# Patient Record
Sex: Female | Born: 1950
Health system: Southern US, Community
[De-identification: ages and names within clinical notes are randomized; demographics above are authoritative.]

## PROBLEM LIST (undated history)

## (undated) DIAGNOSIS — E785 Hyperlipidemia, unspecified: Secondary | ICD-10-CM

## (undated) DIAGNOSIS — K8020 Calculus of gallbladder without cholecystitis without obstruction: Secondary | ICD-10-CM

## (undated) DIAGNOSIS — C801 Malignant (primary) neoplasm, unspecified: Secondary | ICD-10-CM

## (undated) DIAGNOSIS — F329 Major depressive disorder, single episode, unspecified: Secondary | ICD-10-CM

## (undated) DIAGNOSIS — E039 Hypothyroidism, unspecified: Secondary | ICD-10-CM

## (undated) DIAGNOSIS — F32A Depression, unspecified: Secondary | ICD-10-CM

## (undated) DIAGNOSIS — K589 Irritable bowel syndrome without diarrhea: Secondary | ICD-10-CM

## (undated) DIAGNOSIS — F419 Anxiety disorder, unspecified: Secondary | ICD-10-CM

## (undated) DIAGNOSIS — E079 Disorder of thyroid, unspecified: Secondary | ICD-10-CM

## (undated) DIAGNOSIS — I10 Essential (primary) hypertension: Secondary | ICD-10-CM

## (undated) DIAGNOSIS — Z87442 Personal history of urinary calculi: Secondary | ICD-10-CM

## (undated) DIAGNOSIS — K219 Gastro-esophageal reflux disease without esophagitis: Secondary | ICD-10-CM

## (undated) DIAGNOSIS — J189 Pneumonia, unspecified organism: Secondary | ICD-10-CM

## (undated) HISTORY — DX: Gastro-esophageal reflux disease without esophagitis: K21.9

## (undated) HISTORY — DX: Calculus of gallbladder without cholecystitis without obstruction: K80.20

## (undated) HISTORY — DX: Disorder of thyroid, unspecified: E07.9

## (undated) HISTORY — PX: TOE FUSION: SHX1070

## (undated) HISTORY — DX: Hyperlipidemia, unspecified: E78.5

## (undated) HISTORY — DX: Essential (primary) hypertension: I10

## (undated) HISTORY — DX: Anxiety disorder, unspecified: F41.9

## (undated) HISTORY — DX: Irritable bowel syndrome, unspecified: K58.9

---

## 1978-01-18 HISTORY — PX: TUBAL LIGATION: SHX77

## 1994-01-18 HISTORY — PX: APPENDECTOMY: SHX54

## 1995-01-19 HISTORY — PX: BREAST MASS EXCISION: SHX1267

## 1998-03-28 ENCOUNTER — Other Ambulatory Visit: Admission: RE | Admit: 1998-03-28 | Discharge: 1998-03-28 | Payer: Self-pay

## 1999-04-21 ENCOUNTER — Other Ambulatory Visit: Admission: RE | Admit: 1999-04-21 | Discharge: 1999-04-21 | Payer: Self-pay | Admitting: Family Medicine

## 2000-05-02 ENCOUNTER — Other Ambulatory Visit: Admission: RE | Admit: 2000-05-02 | Discharge: 2000-05-02 | Payer: Self-pay | Admitting: Family Medicine

## 2001-09-19 ENCOUNTER — Other Ambulatory Visit: Admission: RE | Admit: 2001-09-19 | Discharge: 2001-09-19 | Payer: Self-pay | Admitting: Family Medicine

## 2002-09-26 ENCOUNTER — Other Ambulatory Visit: Admission: RE | Admit: 2002-09-26 | Discharge: 2002-09-26 | Payer: Self-pay | Admitting: Family Medicine

## 2003-10-14 ENCOUNTER — Other Ambulatory Visit: Admission: RE | Admit: 2003-10-14 | Discharge: 2003-10-14 | Payer: Self-pay | Admitting: Family Medicine

## 2004-01-06 ENCOUNTER — Ambulatory Visit (HOSPITAL_COMMUNITY): Admission: RE | Admit: 2004-01-06 | Discharge: 2004-01-06 | Payer: Self-pay | Admitting: Gastroenterology

## 2004-10-16 ENCOUNTER — Other Ambulatory Visit: Admission: RE | Admit: 2004-10-16 | Discharge: 2004-10-16 | Payer: Self-pay | Admitting: Family Medicine

## 2005-10-29 ENCOUNTER — Other Ambulatory Visit: Admission: RE | Admit: 2005-10-29 | Discharge: 2005-10-29 | Payer: Self-pay | Admitting: Family Medicine

## 2011-06-03 ENCOUNTER — Encounter (INDEPENDENT_AMBULATORY_CARE_PROVIDER_SITE_OTHER): Payer: Self-pay

## 2011-06-03 DIAGNOSIS — K8020 Calculus of gallbladder without cholecystitis without obstruction: Secondary | ICD-10-CM | POA: Insufficient documentation

## 2011-06-10 ENCOUNTER — Encounter (INDEPENDENT_AMBULATORY_CARE_PROVIDER_SITE_OTHER): Payer: Self-pay | Admitting: Surgery

## 2011-06-10 ENCOUNTER — Ambulatory Visit (INDEPENDENT_AMBULATORY_CARE_PROVIDER_SITE_OTHER): Payer: BC Managed Care – PPO | Admitting: Surgery

## 2011-06-10 VITALS — BP 122/84 | HR 92 | Temp 97.8°F | Resp 12 | Ht 63.0 in | Wt 138.4 lb

## 2011-06-10 DIAGNOSIS — K829 Disease of gallbladder, unspecified: Secondary | ICD-10-CM | POA: Insufficient documentation

## 2011-06-10 NOTE — Progress Notes (Signed)
Re:   Jaclyn Schmidt DOB:   10/08/1950 MRN:   [id number]  ASSESSMENT AND PLAN: 1.  Gall bladder disease, gall stones.  Not totally typical symptoms.  We talked a lot about seeing Dr. Randa Evens before surgery, doing more tests, or going straight to surgery.  I discussed with the patient the indications and risks of gall bladder surgery.  The primary risks of gall bladder surgery include, but are not limited to, bleeding, infection, common bile duct injury, and open surgery.  There is also the risk that the patient may have continued symptoms after surgery.  However, the likelihood of improvement in symptoms and return to the patient's normal status is good. We discussed the typical post-operative recovery course. I tried to answer the patient's questions.  I gave the patient literature about gall bladder surgery.  She wants go with surgery first and then see how she does.  2.  Hypothyroid. 3.  Hyperlipidemia. 4.  Anxiety.  Chief Complaint  Patient presents with  . Abdominal Pain    new pt- eval GB w/ stones   REFERRING PHYSICIAN: Remus Loffler, PA, PA  HISTORY OF PRESENT ILLNESS: Jaclyn Schmidt is a 61 y.o. (DOB: 10-04-1950)  white female whose primary care physician is Remus Loffler, PA, PA and comes to me today for gall bladder disease.  I know Jaclyn Schmidt, because I took care of her husband, Jaclyn Schmidt, in the [phone] with metastatic melanoma.  Since his death, she has remarried.  She now has 3 grandchildren, twins born 01/11/2011.  The patient has had known gallstones for years. She's also had irritable bowel syndrome and has been on Levsin for at least 5 years. She at one time she had trouble with constipation, but now she has more urgency with her bowel movements.  Over the past several months, she says she keeps getting sick. She has discomfort in her epigastrium and retrosternal area. She's had a history of gastroesophageal reflux disease and has tried different medications for gastric  acid such as Prilosec and omeprazole. These have not really improved her symptoms. She had a recent ultrasound performed in April 2013 showed multiple large gallstones.  Dr. Vilinda Boehringer did a colonoscopy for screening on her just 2 weeks ago. (Note:  Dr. Tawni Levy mother's colon cancer.) Unfortunately, she did not talk to Dr. Vilinda Boehringer about her abdominal symptoms.  She has no history of liver disease.  No history of pancreas disease.  No history of colon disease.  She's had a prior laparoscopic appendectomy in the 1990s.  [place] - 05/03/2011 - several large gall stones @ [facility]    Past Medical History  Diagnosis Date  . Hyperlipidemia   . Thyroid disease   . Irritable bowel syndrome   . Vitamin d deficiency   . Cholelithiasis   . Anxiety   . GERD (gastroesophageal reflux disease)   . Hypertension       Past Surgical History  Procedure Date  . Breast mass excision 1997    left  . Appendectomy 1996  . Tubal ligation 1980      Current Outpatient Prescriptions  Medication Sig Dispense Refill  . ALPRAZolam (XANAX) 0.5 MG tablet Take 0.25 mg by mouth 3 (three) times daily.      . Cholecalciferol (VITAMIN D PO) Take 1,000 Units by mouth daily.      . hyoscyamine (LEVSIN SL) 0.125 MG SL tablet Place 0.125 mg under the tongue every 4 (four) hours as needed.      Marland Kitchen  levothyroxine (SYNTHROID) 75 MCG tablet Take 75 mcg by mouth daily. On an empty stomach in the morning      . metoprolol tartrate (LOPRESSOR) 25 MG tablet Take 25 mg by mouth daily.      . Multiple Vitamin (MULTIVITAMIN) capsule Take 1 capsule by mouth daily.      . NON FORMULARY 2XT gold- dietary medication      . omeprazole (PRILOSEC) 20 MG capsule Take 20 mg by mouth daily.      . pravastatin (PRAVACHOL) 40 MG tablet Take 40 mg by mouth daily.          Allergies  Allergen Reactions  . Penicillins Rash    REVIEW OF SYSTEMS: Skin:  No history of rash.  No history of abnormal  moles. Infection:  No history of hepatitis or HIV.  No history of MRSA. Neurologic:  No history of stroke.  No history of seizure.  No history of headaches. Cardiac:  Hypertension x 1 year.  No history of seeing a cardiologist. Pulmonary: Quit smoking 2 years ago.  Endocrine:  No diabetes. On thyroid replacement.  Just adjusted her dose up.  Hypercholesterolemia. Gastrointestinal:  See HPI. Urologic:  No history of kidney stones.  No history of bladder infections. Musculoskeletal:  No history of joint or back disease. Hematologic:  No bleeding disorder.  No history of anemia.  Not anticoagulated. Psycho-social:  The patient is oriented.   The patient has no obvious psychologic or social impairment to understanding our conversation and plan.  SOCIAL and FAMILY HISTORY: Divorced x 2 years, but she says she gets along with him better now that before. Has one son.  The son has the twin grandchildren born 12/24. Works at [employer] (for almost 30 years.)  PHYSICAL EXAM: BP 122/84  Pulse 92  Temp(Src) 97.8 F (36.6 C) (Temporal)  Resp 12  Ht 5\' 3"  (1.6 m)  Wt 138 lb 6.4 oz (62.778 kg)  BMI 24.52 kg/m2  General: WN WF who is alert and generally healthy appearing. It looks like she has been in a tanning booth. HEENT: Normal. Pupils equal. Good dentition. Neck: Supple. No mass.  No thyroid mass. Lymph Nodes:  No supraclavicular or cervical nodes. Lungs: Clear to auscultation and symmetric breath sounds. Heart:  RRR. No murmur or rub. Abdomen: Soft. No mass. No tenderness. No hernia. Normal bowel sounds.  Infraumbilical scar from lap appendectomy Rectal: Not done. Extremities:  Good strength and ROM  in upper and lower extremities. Neurologic:  Grossly intact to motor and sensory function. Psychiatric: Has normal mood and affect. Behavior is normal.   DATA REVIEWED: Notes from Prudy Feeler and [place] report.  Ovidio Kin, MD,  [facility] [address]  [address]   [place], [place]    [address] Phone:  [phone] FAX:  [phone]

## 2011-07-06 ENCOUNTER — Other Ambulatory Visit (INDEPENDENT_AMBULATORY_CARE_PROVIDER_SITE_OTHER): Payer: Self-pay | Admitting: Surgery

## 2011-07-06 NOTE — Pre-Procedure Instructions (Addendum)
Please enter pt orders for surgery, pre op is 07-07-2011 at 0830am. thanks

## 2011-07-07 ENCOUNTER — Ambulatory Visit (HOSPITAL_COMMUNITY)
Admission: RE | Admit: 2011-07-07 | Discharge: 2011-07-07 | Disposition: A | Discharge status: Home / Self Care | Payer: BC Managed Care – PPO | Source: Ambulatory Visit | Attending: Surgery | Admitting: Surgery

## 2011-07-07 ENCOUNTER — Encounter (HOSPITAL_COMMUNITY): Payer: Self-pay

## 2011-07-07 ENCOUNTER — Encounter (HOSPITAL_COMMUNITY)
Admission: RE | Admit: 2011-07-07 | Discharge: 2011-07-07 | Disposition: A | Discharge status: Home / Self Care | Payer: BC Managed Care – PPO | Source: Ambulatory Visit | Attending: Surgery | Admitting: Surgery

## 2011-07-07 DIAGNOSIS — Z87891 Personal history of nicotine dependence: Secondary | ICD-10-CM | POA: Insufficient documentation

## 2011-07-07 DIAGNOSIS — Z01812 Encounter for preprocedural laboratory examination: Secondary | ICD-10-CM | POA: Insufficient documentation

## 2011-07-07 DIAGNOSIS — K8020 Calculus of gallbladder without cholecystitis without obstruction: Secondary | ICD-10-CM | POA: Insufficient documentation

## 2011-07-07 DIAGNOSIS — Z01818 Encounter for other preprocedural examination: Secondary | ICD-10-CM | POA: Insufficient documentation

## 2011-07-07 DIAGNOSIS — I10 Essential (primary) hypertension: Secondary | ICD-10-CM | POA: Insufficient documentation

## 2011-07-07 HISTORY — DX: Hypothyroidism, unspecified: E03.9

## 2011-07-07 LAB — DIFFERENTIAL
Basophils Absolute: 0 10*3/uL (ref 0.0–0.1)
Basophils Relative: 1 % (ref 0–1)
Eosinophils Absolute: 0.1 10*3/uL (ref 0.0–0.7)
Eosinophils Relative: 3 % (ref 0–5)
Lymphocytes Relative: 55 % — ABNORMAL HIGH (ref 12–46)
Lymphs Abs: 2 10*3/uL (ref 0.7–4.0)
Monocytes Absolute: 0.4 10*3/uL (ref 0.1–1.0)
Monocytes Relative: 12 % (ref 3–12)
Neutro Abs: 1.1 10*3/uL — ABNORMAL LOW (ref 1.7–7.7)
Neutrophils Relative %: 30 % — ABNORMAL LOW (ref 43–77)

## 2011-07-07 LAB — CBC
HCT: 39.4 % (ref 36.0–46.0)
Hemoglobin: 13.4 g/dL (ref 12.0–15.0)
MCH: 29.3 pg (ref 26.0–34.0)
MCHC: 34 g/dL (ref 30.0–36.0)
MCV: 86.2 fL (ref 78.0–100.0)
Platelets: 267 10*3/uL (ref 150–400)
RBC: 4.57 MIL/uL (ref 3.87–5.11)
RDW: 12.3 % (ref 11.5–15.5)
WBC: 3.6 10*3/uL — ABNORMAL LOW (ref 4.0–10.5)

## 2011-07-07 LAB — COMPREHENSIVE METABOLIC PANEL
ALT: 15 U/L (ref 0–35)
AST: 19 U/L (ref 0–37)
Albumin: 4.1 g/dL (ref 3.5–5.2)
Alkaline Phosphatase: 86 U/L (ref 39–117)
BUN: 7 mg/dL (ref 6–23)
CO2: 28 mEq/L (ref 19–32)
Calcium: 9.8 mg/dL (ref 8.4–10.5)
Chloride: 104 mEq/L (ref 96–112)
Creatinine, Ser: 0.59 mg/dL (ref 0.50–1.10)
GFR calc Af Amer: >90 mL/min (ref >90)
GFR calc non Af Amer: >90 mL/min (ref >90)
Glucose, Bld: 94 mg/dL (ref 70–99)
Potassium: 4 mEq/L (ref 3.5–5.1)
Sodium: 140 mEq/L (ref 135–145)
Total Bilirubin: 0.3 mg/dL (ref 0.3–1.2)
Total Protein: 7.3 g/dL (ref 6.0–8.3)

## 2011-07-07 LAB — SURGICAL PCR SCREEN
MRSA, PCR: NEGATIVE
Staphylococcus aureus: NEGATIVE

## 2011-07-07 NOTE — Patient Instructions (Signed)
20 Kaavya Puskarich  07/07/2011   Your procedure is scheduled on:  07/15/11 0940am-1130am  Report to [facility] at 0715 AM.  Call this number if you have problems the morning of surgery: [phone]   Remember:   Do not eat food:After Midnight.  May have clear liquids:until Midnight . Marland Kitchen  Take these medicines the morning of surgery with A SIP OF WATER:   Do not wear jewelry, make-up or nail polish.  Do not wear lotions, powders, or perfumes.  Do not shave 48 hours prior to surgery.  Do not bring valuables to the hospital.  Contacts, dentures or bridgework may not be worn into surgery.  .   Patients discharged the day of surgery will not be allowed to drive home.  Name and phone number of your driver:   Special Instructions: CHG Shower Use Special Wash: 1/2 bottle night before surgery and 1/2 bottle morning of surgery. shower chin to toes with CHG.  Wash face and private parts with regular soap.   Please read over the following fact sheets that you were given: MRSA Information, coughing and deep breathing exercises, leg exercises

## 2011-07-14 ENCOUNTER — Encounter (INDEPENDENT_AMBULATORY_CARE_PROVIDER_SITE_OTHER): Payer: Self-pay

## 2011-07-15 ENCOUNTER — Ambulatory Visit (HOSPITAL_COMMUNITY): Payer: BC Managed Care – PPO | Admitting: Anesthesiology

## 2011-07-15 ENCOUNTER — Ambulatory Visit (HOSPITAL_COMMUNITY)
Admission: RE | Admit: 2011-07-15 | Discharge: 2011-07-15 | Disposition: A | Discharge status: Home / Self Care | Payer: BC Managed Care – PPO | Source: Ambulatory Visit | Attending: Surgery | Admitting: Surgery

## 2011-07-15 ENCOUNTER — Encounter (HOSPITAL_COMMUNITY)
Admission: RE | Disposition: A | Discharge status: Home / Self Care | Payer: Self-pay | Source: Ambulatory Visit | Attending: Surgery

## 2011-07-15 ENCOUNTER — Encounter (HOSPITAL_COMMUNITY): Payer: Self-pay | Admitting: *Deleted

## 2011-07-15 ENCOUNTER — Ambulatory Visit (HOSPITAL_COMMUNITY): Payer: BC Managed Care – PPO

## 2011-07-15 ENCOUNTER — Encounter (HOSPITAL_COMMUNITY): Payer: Self-pay | Admitting: Anesthesiology

## 2011-07-15 DIAGNOSIS — Z79899 Other long term (current) drug therapy: Secondary | ICD-10-CM | POA: Insufficient documentation

## 2011-07-15 DIAGNOSIS — E039 Hypothyroidism, unspecified: Secondary | ICD-10-CM | POA: Insufficient documentation

## 2011-07-15 DIAGNOSIS — K829 Disease of gallbladder, unspecified: Secondary | ICD-10-CM

## 2011-07-15 DIAGNOSIS — K8010 Calculus of gallbladder with chronic cholecystitis without obstruction: Secondary | ICD-10-CM | POA: Insufficient documentation

## 2011-07-15 DIAGNOSIS — E785 Hyperlipidemia, unspecified: Secondary | ICD-10-CM | POA: Insufficient documentation

## 2011-07-15 DIAGNOSIS — I10 Essential (primary) hypertension: Secondary | ICD-10-CM | POA: Insufficient documentation

## 2011-07-15 DIAGNOSIS — K219 Gastro-esophageal reflux disease without esophagitis: Secondary | ICD-10-CM | POA: Insufficient documentation

## 2011-07-15 HISTORY — PX: CHOLECYSTECTOMY: SHX55

## 2011-07-15 SURGERY — LAPAROSCOPIC CHOLECYSTECTOMY WITH INTRAOPERATIVE CHOLANGIOGRAM
Anesthesia: General | Site: Abdomen | Wound class: Clean Contaminated

## 2011-07-15 MED ORDER — CHLORHEXIDINE GLUCONATE 4 % EX LIQD
1.0000 "application " | Freq: Once | CUTANEOUS | Status: DC
  Filled 2011-07-15: qty 15

## 2011-07-15 MED ORDER — BUPIVACAINE HCL (PF) 0.25 % IJ SOLN
INTRAMUSCULAR | Status: DC | PRN
  Administered 2011-07-15: 20 mL

## 2011-07-15 MED ORDER — IOHEXOL 300 MG/ML  SOLN
INTRAMUSCULAR | Status: DC | PRN
  Administered 2011-07-15: 10 mL via INTRAVENOUS

## 2011-07-15 MED ORDER — CEFAZOLIN SODIUM 1-5 GM-% IV SOLN
1.0000 g | INTRAVENOUS | Status: AC
  Administered 2011-07-15: 1 g via INTRAVENOUS

## 2011-07-15 MED ORDER — LACTATED RINGERS IV SOLN: INTRAVENOUS | Status: DC

## 2011-07-15 MED ORDER — LIDOCAINE HCL (CARDIAC) 20 MG/ML IV SOLN
INTRAVENOUS | Status: DC | PRN
  Administered 2011-07-15: 60 mg via INTRAVENOUS

## 2011-07-15 MED ORDER — SUFENTANIL CITRATE 50 MCG/ML IV SOLN
INTRAVENOUS | Status: DC | PRN
  Administered 2011-07-15 (×4): 10 ug via INTRAVENOUS

## 2011-07-15 MED ORDER — PROPOFOL 10 MG/ML IV EMUL: INTRAVENOUS | Status: DC | PRN

## 2011-07-15 MED ORDER — PROPOFOL 10 MG/ML IV EMUL
INTRAVENOUS | Status: DC | PRN
  Administered 2011-07-15: 180 mg via INTRAVENOUS

## 2011-07-15 MED ORDER — ACETAMINOPHEN 10 MG/ML IV SOLN
INTRAVENOUS | Status: DC | PRN
  Administered 2011-07-15: 1000 mg via INTRAVENOUS

## 2011-07-15 MED ORDER — LIDOCAINE HCL 4 % MT SOLN
OROMUCOSAL | Status: DC | PRN
  Administered 2011-07-15: 4 mL via TOPICAL

## 2011-07-15 MED ORDER — ONDANSETRON HCL 4 MG/2ML IJ SOLN
INTRAMUSCULAR | Status: DC | PRN
  Administered 2011-07-15: 4 mg via INTRAVENOUS

## 2011-07-15 MED ORDER — DEXAMETHASONE SODIUM PHOSPHATE 10 MG/ML IJ SOLN
INTRAMUSCULAR | Status: DC | PRN
  Administered 2011-07-15: 10 mg via INTRAVENOUS

## 2011-07-15 MED ORDER — HYDROCODONE-ACETAMINOPHEN 5-325 MG PO TABS: 1.0000 | ORAL_TABLET | Freq: Four times a day (QID) | ORAL | Status: AC | PRN

## 2011-07-15 MED ORDER — IOHEXOL 300 MG/ML  SOLN
INTRAMUSCULAR | Status: AC
  Filled 2011-07-15: qty 1

## 2011-07-15 MED ORDER — CEFAZOLIN SODIUM 1-5 GM-% IV SOLN
INTRAVENOUS | Status: AC
Start: 2011-07-15 — End: 2011-07-15
  Filled 2011-07-15: qty 50

## 2011-07-15 MED ORDER — ACETAMINOPHEN 10 MG/ML IV SOLN
INTRAVENOUS | Status: AC
  Filled 2011-07-15: qty 100

## 2011-07-15 MED ORDER — GLYCOPYRROLATE 0.2 MG/ML IJ SOLN
INTRAMUSCULAR | Status: DC | PRN
  Administered 2011-07-15: 0.4 mg via INTRAVENOUS

## 2011-07-15 MED ORDER — NEOSTIGMINE METHYLSULFATE 1 MG/ML IJ SOLN
INTRAMUSCULAR | Status: DC | PRN
  Administered 2011-07-15: 3 mg via INTRAVENOUS

## 2011-07-15 MED ORDER — LACTATED RINGERS IR SOLN
Status: DC | PRN
  Administered 2011-07-15: 1000 mL

## 2011-07-15 MED ORDER — ROCURONIUM BROMIDE 100 MG/10ML IV SOLN
INTRAVENOUS | Status: DC | PRN
  Administered 2011-07-15: 40 mg via INTRAVENOUS

## 2011-07-15 MED ORDER — HYDROMORPHONE HCL PF 1 MG/ML IJ SOLN: 0.2500 mg | INTRAMUSCULAR | Status: DC | PRN

## 2011-07-15 MED ORDER — KETOROLAC TROMETHAMINE 30 MG/ML IJ SOLN
INTRAMUSCULAR | Status: DC | PRN
  Administered 2011-07-15: 30 mg via INTRAVENOUS

## 2011-07-15 MED ORDER — BUPIVACAINE HCL (PF) 0.25 % IJ SOLN
INTRAMUSCULAR | Status: AC
Start: 2011-07-15 — End: 2011-07-15
  Filled 2011-07-15: qty 30

## 2011-07-15 MED ORDER — MIDAZOLAM HCL 5 MG/5ML IJ SOLN
INTRAMUSCULAR | Status: DC | PRN
  Administered 2011-07-15: 1 mg via INTRAVENOUS

## 2011-07-15 MED ORDER — LACTATED RINGERS IV SOLN
INTRAVENOUS | Status: DC
  Administered 2011-07-15: 1000 mL via INTRAVENOUS
  Administered 2011-07-15 (×2): via INTRAVENOUS

## 2011-07-15 MED ORDER — PROMETHAZINE HCL 25 MG/ML IJ SOLN: 6.2500 mg | INTRAMUSCULAR | Status: DC | PRN

## 2011-07-15 SURGICAL SUPPLY — 44 items
ADH SKN CLS APL DERMABOND .7 (GAUZE/BANDAGES/DRESSINGS) ×2
APL SKNCLS STERI-STRIP NONHPOA (GAUZE/BANDAGES/DRESSINGS) ×1
APPLIER CLIP ROT 10 11.4 M/L (STAPLE) ×2
APR CLP MED LRG 11.4X10 (STAPLE) ×1
BAG SPEC RTRVL LRG 6X4 10 (ENDOMECHANICALS) ×1
BENZOIN TINCTURE PRP APPL 2/3 (GAUZE/BANDAGES/DRESSINGS) ×2 IMPLANT
CANISTER SUCTION 2500CC (MISCELLANEOUS) ×2 IMPLANT
CHLORAPREP W/TINT 26ML (MISCELLANEOUS) ×2 IMPLANT
CHOLANGIOGRAM CATH TAUT (CATHETERS) ×2 IMPLANT
CLIP APPLIE ROT 10 11.4 M/L (STAPLE) ×1 IMPLANT
CLOTH BEACON ORANGE TIMEOUT ST (SAFETY) ×2 IMPLANT
COVER MAYO STAND STRL (DRAPES) ×1 IMPLANT
DECANTER SPIKE VIAL GLASS SM (MISCELLANEOUS) ×1 IMPLANT
DERMABOND ADVANCED (GAUZE/BANDAGES/DRESSINGS) ×2
DERMABOND ADVANCED .7 DNX12 (GAUZE/BANDAGES/DRESSINGS) IMPLANT
DRAPE C-ARM 42X72 X-RAY (DRAPES) ×1 IMPLANT
DRAPE LAPAROSCOPIC ABDOMINAL (DRAPES) ×2 IMPLANT
ELECT REM PT RETURN 9FT ADLT (ELECTROSURGICAL) ×2
ELECTRODE REM PT RTRN 9FT ADLT (ELECTROSURGICAL) ×1 IMPLANT
GLOVE BIOGEL PI IND STRL 7.0 (GLOVE) ×1 IMPLANT
GLOVE BIOGEL PI INDICATOR 7.0 (GLOVE) ×1
GLOVE SURG SIGNA 7.5 PF LTX (GLOVE) ×2 IMPLANT
GOWN STRL NON-REIN LRG LVL3 (GOWN DISPOSABLE) ×2 IMPLANT
GOWN STRL REIN XL XLG (GOWN DISPOSABLE) ×4 IMPLANT
HEMOSTAT SURGICEL 4X8 (HEMOSTASIS) IMPLANT
IV CATH 14GX2 1/4 (CATHETERS) ×2 IMPLANT
IV SET EXT 30 76VOL 4 MALE LL (IV SETS) ×2 IMPLANT
KIT BASIN OR (CUSTOM PROCEDURE TRAY) ×2 IMPLANT
NS IRRIG 1000ML POUR BTL (IV SOLUTION) ×1 IMPLANT
POUCH SPECIMEN RETRIEVAL 10MM (ENDOMECHANICALS) ×1 IMPLANT
SET IRRIG TUBING LAPAROSCOPIC (IRRIGATION / IRRIGATOR) ×2 IMPLANT
SLEEVE Z-THREAD 5X100MM (TROCAR) IMPLANT
SOLUTION ANTI FOG 6CC (MISCELLANEOUS) ×2 IMPLANT
STOPCOCK K 69 2C6206 (IV SETS) ×2 IMPLANT
STRIP CLOSURE SKIN 1/4X4 (GAUZE/BANDAGES/DRESSINGS) ×1 IMPLANT
SUT VIC AB 5-0 PS2 18 (SUTURE) ×2 IMPLANT
TOWEL OR 17X26 10 PK STRL BLUE (TOWEL DISPOSABLE) ×5 IMPLANT
TRAY LAP CHOLE (CUSTOM PROCEDURE TRAY) ×2 IMPLANT
TROCAR XCEL BLUNT TIP 100MML (ENDOMECHANICALS) ×2 IMPLANT
TROCAR Z-THREAD FIOS 11X100 BL (TROCAR) ×2 IMPLANT
TROCAR Z-THREAD FIOS 5X100MM (TROCAR) ×6 IMPLANT
TROCAR Z-THREAD SLEEVE 11X100 (TROCAR) ×1 IMPLANT
TUBING INSUFFLATION 10FT LAP (TUBING) ×2 IMPLANT
WATER STERILE IRR 1500ML POUR (IV SOLUTION) ×2 IMPLANT

## 2011-07-15 NOTE — Transfer of Care (Signed)
Immediate Anesthesia Transfer of Care Note  Patient: Jaclyn Schmidt  Procedure(s) Performed: Procedure(s) (LRB): LAPAROSCOPIC CHOLECYSTECTOMY WITH INTRAOPERATIVE CHOLANGIOGRAM (N/A)  Patient Location: PACU  Anesthesia Type: General  Level of Consciousness: awake and patient cooperative  Airway & Oxygen Therapy: Patient Spontanous Breathing and Patient connected to face mask oxygen  Post-op Assessment: Report given to PACU RN and Post -op Vital signs reviewed and stable  Post vital signs: stable  Complications: No apparent anesthesia complications

## 2011-07-15 NOTE — Anesthesia Postprocedure Evaluation (Signed)
Anesthesia Post Note  Patient: Jaclyn Schmidt  Procedure(s) Performed: Procedure(s) (LRB): LAPAROSCOPIC CHOLECYSTECTOMY WITH INTRAOPERATIVE CHOLANGIOGRAM (N/A)  Anesthesia type: General  Patient location: PACU  Post pain: Pain level controlled  Post assessment: Post-op Vital signs reviewed  Last Vitals:  Filed Vitals:   07/15/11 1230  BP: 123/69  Pulse: 76  Temp:   Resp: 17    Post vital signs: Reviewed  Level of consciousness: sedated  Complications: No apparent anesthesia complications

## 2011-07-15 NOTE — Discharge Instructions (Signed)
[facility] - DISCHARGE INSTRUCTIONS TO PATIENT  Return to work on:  Next week  Activity:  Driving - May drive in 2 to 3 days, doing well   Lifting - no lifting >15 pounds for 1 week  Wound Care:   May shower starting Saturday, 6/29.  Diet:  As tolerated  Follow up appointment:  Call Dr. Allene Pyo office [facility]) at [phone] for an appointment in 2 to 3 weeks.  Medications and dosages:  Resume your home medications.  You have a prescription for:  Vicodin  Call Dr. Ezzard Standing or his office  [phone]) if you have:  Temperature greater than 100.4,  Persistent nausea and vomiting,  Severe uncontrolled pain,  Redness, tenderness, or signs of infection (pain, swelling, redness, odor or green/yellow discharge around the site),  Difficulty breathing, headache or visual disturbances,  Any other questions or concerns you may have after discharge.  In an emergency, call 911 or go to an Emergency Department at a nearby hospital.

## 2011-07-15 NOTE — Anesthesia Preprocedure Evaluation (Signed)
Anesthesia Evaluation  Patient identified by MRN, date of birth, ID band Patient awake    Reviewed: Allergy & Precautions, H&P , NPO status , Patient's Chart, lab work & pertinent test results  Airway Mallampati: II TM Distance: >3 FB Neck ROM: Full    Dental  (+) Teeth Intact, Caps and Dental Advisory Given,    Pulmonary neg pulmonary ROS,  breath sounds clear to auscultation  Pulmonary exam normal       Cardiovascular Exercise Tolerance: Good hypertension, Pt. on medications and Pt. on home beta blockers Rhythm:Regular Rate:Normal     Neuro/Psych negative neurological ROS  negative psych ROS   GI/Hepatic Neg liver ROS, GERD-  Medicated,  Endo/Other  Hypothyroidism   Renal/GU negative Renal ROS  negative genitourinary   Musculoskeletal negative musculoskeletal ROS (+)   Abdominal   Peds  Hematology negative hematology ROS (+)   Anesthesia Other Findings   Reproductive/Obstetrics negative OB ROS                           Anesthesia Physical Anesthesia Plan  ASA: II  Anesthesia Plan: General   Post-op Pain Management:    Induction: Intravenous  Airway Management Planned: Oral ETT  Additional Equipment:   Intra-op Plan:   Post-operative Plan: Extubation in OR  Informed Consent: I have reviewed the patients History and Physical, chart, labs and discussed the procedure including the risks, benefits and alternatives for the proposed anesthesia with the patient or authorized representative who has indicated his/her understanding and acceptance.   Dental advisory given  Plan Discussed with: CRNA  Anesthesia Plan Comments:         Anesthesia Quick Evaluation

## 2011-07-15 NOTE — Brief Op Note (Signed)
07/15/2011  11:30 AM  PATIENT:  Jaclyn Schmidt, 61 y.o., female, MRN: [id number]  PREOP DIAGNOSIS:  cholelithiasis  POSTOP DIAGNOSIS:   Chronic cholecystitis, cholelithiasis  PROCEDURE:   Procedure(s): LAPAROSCOPIC CHOLECYSTECTOMY WITH INTRAOPERATIVE CHOLANGIOGRAM  SURGEON:   Ovidio Kin, M.D.  Threasa HeadsFredonia Highland, M.D.  ANESTHESIA:   general  Illene Silver, CRNA - CRNA Einar Pheasant, MD - Anesthesiologist Peggy Williford - CRNA  General  EBL:  Minimal  ml  BLOOD ADMINISTERED: none  DRAINS: none   LOCAL MEDICATIONS USED:   30 cc 1/4% marcaine  SPECIMEN:   Gall bladder  COUNTS CORRECT:  YES  INDICATIONS FOR PROCEDURE:  [name] is a 61 y.o. (DOB: 1950/11/17) white female whose primary care physician is Remus Loffler, PA and comes for lap chole.   The indications and risks of the surgery were explained to the patient.  The risks include, but are not limited to, infection, bleeding, and nerve injury.  Note dictated to:   #[id number]

## 2011-07-15 NOTE — H&P (Signed)
Re: Jaclyn Schmidt  DOB: 01-Apr-1950  MRN: [id number]  ASSESSMENT AND PLAN:  1. Gall bladder disease, gall stones.   Not totally typical symptoms. We talked a lot about seeing Dr. Randa Evens before surgery, doing more tests, or going straight to surgery.  I discussed with the patient the indications and risks of gall bladder surgery. The primary risks of gall bladder surgery include, but are not limited to, bleeding, infection, common bile duct injury, and open surgery. There is also the risk that the patient may have continued symptoms after surgery. However, the likelihood of improvement in symptoms and return to the patient's normal status is good. We discussed the typical post-operative recovery course. I tried to answer the patient's questions.   I gave the patient literature about gall bladder surgery.   She wants go with surgery first and then see how she does.   Patient wants to try to go home today.  2. Hypothyroid.  3. Hyperlipidemia.  4. Anxiety.   Chief Complaint   Patient presents with   .  Abdominal Pain     new pt- eval GB w/ stones    REFERRING PHYSICIAN: Remus Loffler, PA, PA   HISTORY OF PRESENT ILLNESS:  Jaclyn Schmidt is a 61 y.o. (DOB: [birth year]) white female whose primary care physician is Remus Loffler, PA, PA and comes to me today for gall bladder disease.   I know Jaclyn Schmidt, because I took care of her husband, Mudlogger, in the [phone] with metastatic melanoma. Since his death, she has remarried. She now has 3 grandchildren, twins born 01/11/2011.   The patient has had known gallstones for years. She's also had irritable bowel syndrome and has been on Levsin for at least 5 years. She at one time she had trouble with constipation, but now she has more urgency with her bowel movements.  Over the past several months, she says she keeps getting sick. She has discomfort in her epigastrium and retrosternal area. She's had a history of gastroesophageal reflux disease and has tried  different medications for gastric acid such as Prilosec and omeprazole.   These have not really improved her symptoms. She had a recent ultrasound performed in April 2013 showed multiple large gallstones.  Dr. Vilinda Boehringer did a colonoscopy for screening on her just 2 weeks ago. (Note: Dr. Tawni Levy mother's colon cancer.) Unfortunately, she did not talk to Dr. Vilinda Boehringer about her abdominal symptoms. She has no history of liver disease. No history of pancreas disease. No history of colon disease.   She's had a prior laparoscopic appendectomy in the 1990s.   [place] - 05/03/2011 - several large gall stones @ [facility]   Past Medical History   Diagnosis  Date   .  Hyperlipidemia    .  Thyroid disease    .  Irritable bowel syndrome    .  Vitamin d deficiency    .  Cholelithiasis    .  Anxiety    .  GERD (gastroesophageal reflux disease)    .  Hypertension     Past Surgical History   Procedure  Date   .  Breast mass excision  1997     left   .  Appendectomy  1996   .  Tubal ligation  1980    Current Outpatient Prescriptions   Medication  Sig  Dispense  Refill   .  ALPRAZolam (XANAX) 0.5 MG tablet  Take 0.25 mg by mouth 3 (three) times daily.     Marland Kitchen  Cholecalciferol (VITAMIN D PO)  Take 1,000 Units by mouth daily.     .  hyoscyamine (LEVSIN SL) 0.125 MG SL tablet  Place 0.125 mg under the tongue every 4 (four) hours as needed.     Marland Kitchen  levothyroxine (SYNTHROID) 75 MCG tablet  Take 75 mcg by mouth daily. On an empty stomach in the morning     .  metoprolol tartrate (LOPRESSOR) 25 MG tablet  Take 25 mg by mouth daily.     .  Multiple Vitamin (MULTIVITAMIN) capsule  Take 1 capsule by mouth daily.     .  NON FORMULARY  2XT gold- dietary medication     .  omeprazole (PRILOSEC) 20 MG capsule  Take 20 mg by mouth daily.     .  pravastatin (PRAVACHOL) 40 MG tablet  Take 40 mg by mouth daily.      Allergies   Allergen  Reactions   .  Penicillins  Rash    REVIEW OF SYSTEMS:    Skin: No history of rash. No history of abnormal moles.  Infection: No history of hepatitis or HIV. No history of MRSA.  Neurologic: No history of stroke. No history of seizure. No history of headaches.  Cardiac: Hypertension x 1 year. No history of seeing a cardiologist.  Pulmonary: Quit smoking 2 years ago.  Endocrine: No diabetes. On thyroid replacement. Just adjusted her dose up. Hypercholesterolemia.  Gastrointestinal: See HPI.  Urologic: No history of kidney stones. No history of bladder infections.  Musculoskeletal: No history of joint or back disease.  Hematologic: No bleeding disorder. No history of anemia. Not anticoagulated.  Psycho-social: The patient is oriented. The patient has no obvious psychologic or social impairment to understanding our conversation and plan.   SOCIAL and FAMILY HISTORY:  Divorced x 2 years, but she says she gets along with him better now that before.  Has one son. The son has the twin grandchildren born 12/24.  Works at [employer] (for almost 30 years.)   PHYSICAL EXAM:  BP 122/73  Pulse 72  Temp 97.4 F (36.3 C) (Oral)  Resp 18  SpO2 100%    Ht 5\' 3"  (1.6 m)  Wt 138 lb 6.4 oz (62.778 kg)  BMI 24.52 kg/m2  General: WN WF who is alert and generally healthy appearing. It looks like she has been in a tanning booth.  HEENT: Normal. Pupils equal. Good dentition.  Neck: Supple. No mass. No thyroid mass.  Lymph Nodes: No supraclavicular or cervical nodes.  Lungs: Clear to auscultation and symmetric breath sounds.  Heart: RRR. No murmur or rub.  Abdomen: Soft. No mass. No tenderness. No hernia. Normal bowel sounds. Infraumbilical scar from lap appendectomy  Rectal: Not done.  Extremities: Good strength and ROM in upper and lower extremities.  Neurologic: Grossly intact to motor and sensory function.  Psychiatric: Has normal mood and affect. Behavior is normal.   DATA REVIEWED:  Notes from Prudy Feeler and [place] report.   Ovidio Kin, MD, [facility]  [address] [place]  [place], [place] [address]  Phone: [phone] FAX: [phone]

## 2011-07-16 ENCOUNTER — Telehealth (INDEPENDENT_AMBULATORY_CARE_PROVIDER_SITE_OTHER): Payer: Self-pay

## 2011-07-16 ENCOUNTER — Encounter (HOSPITAL_COMMUNITY): Payer: Self-pay | Admitting: Surgery

## 2011-07-16 NOTE — Telephone Encounter (Signed)
Message copied by Ivory Broad on Fri Jul 16, 2011 10:29 AM ------      Message from: Cathi Roan      Created: Fri Jul 16, 2011 10:13 AM      Regarding: 1st post op       [phone] Patient needs post op apt 2 to 3 weeks already has 1 for 08/19/11 but she said it was to far out.

## 2011-07-16 NOTE — Op Note (Signed)
NAMECARNESHA, MARAVILLA                ACCOUNT NO.:  [id number]  MEDICAL RECORD NO.:  [id number]  LOCATION:  [facility]                         FACILITY:  [facility]  PHYSICIAN:  Sandria Bales. Ezzard Standing, M.D.  DATE OF BIRTH:  Jun 11, 1950  DATE OF PROCEDURE:  07/15/2011                              OPERATIVE REPORT  PREOPERATIVE DIAGNOSIS:  Symptomatic cholelithiasis.  POSTOPERATIVE DIAGNOSIS:  Chronic cholecystitis, cholelithiasis.  PROCEDURES:  Laparoscopic cholecystectomy with intraoperative cholangiogram.  SURGEON:  Sandria Bales. Ezzard Standing, MD  FIRST ASSISTANT:  Mary Sella. Andrey Campanile, MD, FACS  ANESTHESIA:  General endotracheal.  ESTIMATED BLOOD LOSS:  Minimal.  Local anesthesia was 30 mL of 0.25% Marcaine.  INDICATION FOR PROCEDURE:  Ms. Storti is a 61 year old white female who sees Prudy Feeler, [place] as her primary care doctor.  I saw her for recurrent epigastric and retrosternal pain.  She has tried some anti- reflux medications, which have not helped her symptoms.  An ultrasound in April of 2013 showed multiple large gallstones.  She had a recent colonoscopy by Dr. Carman Ching, which was negative.  I discussed with her about proceeding with cholecystectomy.  I discussed the indications and potential complications.  The potential complications of laparoscopic cholecystectomy include, but are not limited to, bleeding, infection, common bile duct injury, and open surgery.  OPERATIVE NOTE:  The patient was taken to room #[facility] at [facility], where she underwent a general endotracheal anesthesia supervised by Dr. Lucille Passy.  A time-out was held and the surgical checklist run.  Her abdomen was prepped with ChloraPrep and sterilely draped.  She was given 1 g of Ancef at the initiation of procedure.  An infraumbilical incision was made with sharp dissection carried down to the abdominal cavity.  A 0-degree 10-mm laparoscope was inserted through the Hasson trocar, and laparoscopic examination  carried out. The right and left lobes of liver were unremarkable.  Stomach was unremarkable.  The bowel that I could see was unremarkable.  She had no tumor mass or nodules within the peritoneal cavity.  I then placed three additional 5-mm trocars.  I placed a 5-mm trocar in the subxiphoid location, a 5-mm in the right mid subcostal, and 5-mm trocar in the lateral subcostal location.  The gallbladder had adhesions of the duodenum and some omentum, which I took down bluntly and sharply and dissected down to the cystic duct- gallbladder junction.  I did shoot an intraoperative cholangiogram.  The intraoperative cholangiogram was shot using a cutoff Taut catheter, inserted through a 14-gauge Jelco.  The Taut catheter was inserted in the side of the cut cystic duct and secured with an Endoclip.  I used about 10 mL of half-strength Hypaque solution.  Then, I injected under fluoroscopy showing free flow of contrast down the cystic duct and common bile duct up the hepatic radicals and into the duodenum.  This was felt to be a normal intraoperative cholangiogram.  The Taut catheter was removed.  The cystic duct was triply endoclipped and divided.  She had a branch of cystic artery went through the triangle of Calot, this was doubly endoclipped and divided.  I then sharply and bluntly dissected the gallbladder from the gallbladder bed.  She had at least one more branch may be a posterior cystic artery going to the gallbladder that I clipped.  After the gallbladder was completely removed, it was placed into an Endocatch bag and delivered through the umbilicus.  I then reinspected the gallbladder bed.  I reinspected the triangle of Calot, I saw no bleeding or bile leak, and no obvious area was concerned.  The trocars were removed in turn.  The umbilical port was closed with 0 Vicryl suture.  The skin at each port was closed with a 5-0 Vicryl suture.  Each wound, I injected about a total  of 30 mL of 0.25% Marcaine in the incisions.  The wounds were then covered with Dermabond.  She was transferred to the recovery room in good condition.  Sponge and needle count were correct at the end of the case.   Sandria Bales. Ezzard Standing, M.D., FACS   DHN/MEDQ  D:  07/15/2011  T:  07/15/2011  Job:  [id number]  cc:   Fayrene Fearing L. Malon Kindle., M.D. Fax: [phone]  Prudy Feeler, PA Fax: [phone]

## 2011-07-16 NOTE — Telephone Encounter (Signed)
I called the pt and gave an appt for July 18th

## 2011-07-26 ENCOUNTER — Telehealth (INDEPENDENT_AMBULATORY_CARE_PROVIDER_SITE_OTHER): Payer: Self-pay | Admitting: General Surgery

## 2011-07-26 NOTE — Telephone Encounter (Signed)
Pt called to ask about bathing, and washing her umbilical site in particular.  Her surgery was on 07/15/11.  Discussed washing gently with soap and water, then pat dry.  She understands and will comply.

## 2011-08-05 ENCOUNTER — Encounter (INDEPENDENT_AMBULATORY_CARE_PROVIDER_SITE_OTHER): Payer: BC Managed Care – PPO | Admitting: Surgery

## 2011-08-19 ENCOUNTER — Encounter (INDEPENDENT_AMBULATORY_CARE_PROVIDER_SITE_OTHER): Payer: Self-pay | Admitting: Surgery

## 2011-08-19 ENCOUNTER — Ambulatory Visit (INDEPENDENT_AMBULATORY_CARE_PROVIDER_SITE_OTHER): Payer: BC Managed Care – PPO | Admitting: Surgery

## 2011-08-19 VITALS — BP 114/72 | HR 64 | Temp 97.0°F | Resp 14 | Ht 63.0 in | Wt 136.6 lb

## 2011-08-19 DIAGNOSIS — K829 Disease of gallbladder, unspecified: Secondary | ICD-10-CM

## 2011-08-19 DIAGNOSIS — K8020 Calculus of gallbladder without cholecystitis without obstruction: Secondary | ICD-10-CM

## 2011-08-19 NOTE — Progress Notes (Signed)
Name:  Jaclyn Schmidt MRN:  [id number]  Post op visit  Has done well post lap chole. Symptoms better, except has some urgency with her bowel movements. Suture at umbilical incision should get better.  Path to patient.  Return to office PRN.  Ovidio Kin, MD, [facility] Pager: [phone] Office phone:  [phone]

## 2013-03-07 HISTORY — PX: ABDOMINAL HYSTERECTOMY: SHX81

## 2013-12-17 ENCOUNTER — Ambulatory Visit (INDEPENDENT_AMBULATORY_CARE_PROVIDER_SITE_OTHER): Payer: BC Managed Care – PPO | Admitting: Nurse Practitioner

## 2013-12-17 ENCOUNTER — Other Ambulatory Visit: Payer: Self-pay | Admitting: Nurse Practitioner

## 2013-12-17 ENCOUNTER — Encounter: Payer: Self-pay | Admitting: Nurse Practitioner

## 2013-12-17 VITALS — BP 125/76 | HR 88 | Temp 97.0°F | Ht 63.0 in | Wt 148.0 lb

## 2013-12-17 DIAGNOSIS — H8113 Benign paroxysmal vertigo, bilateral: Secondary | ICD-10-CM

## 2013-12-17 DIAGNOSIS — H6692 Otitis media, unspecified, left ear: Secondary | ICD-10-CM

## 2013-12-17 MED ORDER — CEFDINIR 300 MG PO CAPS: 300.0000 mg | ORAL_CAPSULE | Freq: Two times a day (BID) | ORAL | Status: DC

## 2013-12-17 MED ORDER — MECLIZINE HCL 25 MG PO TABS: 25.0000 mg | ORAL_TABLET | Freq: Three times a day (TID) | ORAL | Status: DC | PRN

## 2013-12-17 NOTE — Progress Notes (Signed)
   Subjective:    Patient ID: Jaclyn Schmidt, female    DOB: [birth year], 63 y.o.   MRN: [id number]  HPI Patient is here complaining of dizziness that has been going on for the past week. She reports blurred vision and headache. Turning and laying down aggravate it. She has not taking any medication. Staying still.    Review of Systems  HENT: Negative for ear discharge.   Neurological: Positive for dizziness and headaches.  All other systems reviewed and are negative.      Objective:   Physical Exam  Constitutional: She is oriented to person, place, and time. She appears well-developed and well-nourished.  HENT:  Right Ear: No tenderness.  Left Ear: No tenderness. A middle ear effusion is present.  Eyes: Pupils are equal, round, and reactive to light.  Neck: Normal range of motion.  Cardiovascular: Normal rate and regular rhythm.   Pulmonary/Chest: Effort normal.  Musculoskeletal: Normal range of motion.  Neurological: She is alert and oriented to person, place, and time.  Skin: Skin is warm.  Psychiatric: She has a normal mood and affect. Her behavior is normal. Judgment and thought content normal.   BP 125/76 mmHg  Pulse 88  Temp(Src) 97 F (36.1 C) (Oral)  Ht 5\' 3"  (1.6 m)  Wt 148 lb (67.132 kg)  BMI 26.22 kg/m2      Assessment & Plan:  1. Acute left otitis media, recurrence not specified, unspecified otitis media type *use flonase daily as rx Force fluids - cefdinir (OMNICEF) 300 MG capsule; Take 1 capsule (300 mg total) by mouth 2 (two) times daily.  Dispense: 20 capsule; Refill: 0  2. Benign paroxysmal positional vertigo, bilateral - meclizine (ANTIVERT) 25 MG tablet; Take 1 tablet (25 mg total) by mouth 3 (three) times daily as needed for dizziness.  Dispense: 30 tablet; Refill: 0- sedation precaution  Mary-Margaret Hassell Done, FNP

## 2013-12-17 NOTE — Patient Instructions (Addendum)
Vertigo Vertigo means you feel like you are moving when you are not. Vertigo can make you feel like things around you are moving when they are not. This problem often goes away on its own.  HOME CARE   Follow your doctor's instructions.  Avoid driving.  Avoid using heavy machinery.  Avoid doing any activity that could be dangerous if you have a vertigo attack.  Tell your doctor if a medicine seems to cause your vertigo. GET HELP RIGHT AWAY IF:   Your medicines do not help or make you feel worse.  You have trouble talking or walking.  You feel weak or have trouble using your arms, hands, or legs.  You have bad headaches.  You keep feeling sick to your stomach (nauseous) or throwing up (vomiting).  Your vision changes.  A family member notices changes in your behavior.  Your problems get worse. MAKE SURE YOU:  Understand these instructions.  Will watch your condition.  Will get help right away if you are not doing well or get worse. Document Released: 10/14/2007 Document Revised: 03/29/2011 Document Reviewed: 07/23/2010 Exit[employer] Patient Information 2015 [employer]. This information is not intended to replace advice given to you by your health care provider. Make sure you discuss any questions you have with your health care provider.  

## 2014-12-05 ENCOUNTER — Ambulatory Visit (INDEPENDENT_AMBULATORY_CARE_PROVIDER_SITE_OTHER): Payer: BLUE CROSS/BLUE SHIELD | Admitting: Cardiology

## 2014-12-05 ENCOUNTER — Encounter: Payer: Self-pay | Admitting: Cardiology

## 2014-12-05 VITALS — BP 115/74 | HR 65 | Ht 63.0 in | Wt 151.0 lb

## 2014-12-05 DIAGNOSIS — R0602 Shortness of breath: Secondary | ICD-10-CM

## 2014-12-05 DIAGNOSIS — Z136 Encounter for screening for cardiovascular disorders: Secondary | ICD-10-CM | POA: Diagnosis not present

## 2014-12-05 MED ORDER — METOPROLOL TARTRATE 25 MG PO TABS: 12.5000 mg | ORAL_TABLET | Freq: Two times a day (BID) | ORAL | Status: DC

## 2014-12-05 MED ORDER — TRIAMTERENE-HCTZ 37.5-25 MG PO TABS: ORAL_TABLET | ORAL | Status: DC

## 2014-12-05 NOTE — Patient Instructions (Signed)
Your physician recommends that you schedule a follow-up appointment TO BE DETERMINED   Your physician has recommended you make the following change in your medication:   DECREASE LOPRESSOR 12.5 MG [place] 1/2 TABLET DAILY AS NEEDED FOR SWELLING  Thank you for choosing [facility]!!

## 2014-12-05 NOTE — Progress Notes (Addendum)
Patient ID: Jaclyn Schmidt, female   DOB: 07-07-1950, 64 y.o.   MRN: [id number]     Clinical Summary Jaclyn Schmidt is a 64 y.o.female seen today as a new patient for the following medical problems.  1. DOE - started approx 1 month ago - DOE with activities, example walking up from her barn at her home. She used to be able to make this walk without any difficulty. No chest pain. No palpitations, but can get diaphoretic during symptoms - Notes some LE edema and abdominal distension. No orthopnea, no pnd. Recently stated on maxide.   CAD: HTN, HL, former tobacco x 30 years. Father with CABG in his 64s    Past Medical History  Diagnosis Date  . Hyperlipidemia   . Thyroid disease   . Irritable bowel syndrome   . Vitamin D deficiency   . Cholelithiasis   . Anxiety   . GERD (gastroesophageal reflux disease)   . Hypertension   . Hypothyroidism      Allergies  Allergen Reactions  . Penicillins Rash     Current Outpatient Prescriptions  Medication Sig Dispense Refill  . ALPRAZolam (XANAX) 0.5 MG tablet Take 0.25 mg by mouth at bedtime as needed. anxiety    . BEE POLLEN PO Take 2 tablets by mouth daily.    . cefdinir (OMNICEF) 300 MG capsule Take 1 capsule (300 mg total) by mouth 2 (two) times daily. 20 capsule 0  . Cholecalciferol (VITAMIN D PO) Take 1,000 Units by mouth daily.    . fluticasone (FLONASE) 50 MCG/ACT nasal spray   2  . hyoscyamine (LEVSIN SL) 0.125 MG SL tablet Place 0.125 mg under the tongue every 4 (four) hours as needed. gas    . levothyroxine (SYNTHROID, LEVOTHROID) 88 MCG tablet Take 88 mcg by mouth daily.    . meclizine (ANTIVERT) 25 MG tablet Take 1 tablet (25 mg total) by mouth 3 (three) times daily as needed for dizziness. 30 tablet 0  . metoprolol tartrate (LOPRESSOR) 25 MG tablet Take 25 mg by mouth daily.    . Multiple Vitamin (MULTIVITAMIN) capsule Take 1 capsule by mouth daily.    . pravastatin (PRAVACHOL) 40 MG tablet Take 40 mg by mouth daily.     No  current facility-administered medications for this visit.     Past Surgical History  Procedure Laterality Date  . Breast mass excision  1997    left  . Appendectomy  1996  . Tubal ligation  1980  . Cholecystectomy  07/15/2011    Procedure: LAPAROSCOPIC CHOLECYSTECTOMY WITH INTRAOPERATIVE CHOLANGIOGRAM;  Surgeon: Shann Medal, MD;  Location: [facility];  Service: General;  Laterality: N/A;  Cholecystectomy with IOC  . Abdominal hysterectomy  [id number]     Allergies  Allergen Reactions  . Penicillins Rash      Family History  Problem Relation Age of Onset  . Cancer Mother     stomach  . Heart disease Father   . Cancer Maternal Aunt     ovarian  . Cancer Paternal Aunt     breast     Social History Jaclyn Schmidt reports that she quit smoking about 5 years ago. She has never used smokeless tobacco. Jaclyn Schmidt reports that she does not drink alcohol.   Review of Systems CONSTITUTIONAL: No weight loss, fever, chills, weakness or fatigue.  HEENT: Eyes: No visual loss, blurred vision, double vision or yellow sclerae.No hearing loss, sneezing, congestion, runny nose or sore throat.  SKIN: No rash or itching.  CARDIOVASCULAR: per HPI RESPIRATORY: No cough or sputum.  GASTROINTESTINAL: No anorexia, nausea, vomiting or diarrhea. No abdominal pain or blood.  GENITOURINARY: No burning on urination, no polyuria NEUROLOGICAL: No headache, dizziness, syncope, paralysis, ataxia, numbness or tingling in the extremities. No change in bowel or bladder control.  MUSCULOSKELETAL: No muscle, back pain, joint pain or stiffness.  LYMPHATICS: No enlarged nodes. No history of splenectomy.  PSYCHIATRIC: No history of depression or anxiety.  ENDOCRINOLOGIC: No reports of sweating, cold or heat intolerance. No polyuria or polydipsia.  Marland Kitchen   Physical Examination Filed Vitals:   12/05/14 0907  BP: 115/74  Pulse: 65   Filed Vitals:   12/05/14 0907  Height: 5\' 3"  (1.6 m)  Weight: 151 lb (68.493  kg)    Gen: resting comfortably, no acute distress HEENT: no scleral icterus, pupils equal round and reactive, no palptable cervical adenopathy,  CV: RRR, no m/r/g, no jvd Resp: Clear to auscultation bilaterally GI: abdomen is soft, non-tender, non-distended, normal bowel sounds, no hepatosplenomegaly MSK: extremities are warm, no edema.  Skin: warm, no rash Neuro:  no focal deficits Psych: appropriate affect   Diagnostic Studies EKG: NSR    Assessment and Plan  1. DOE - 1 month history of DOE, along with reproted LE edema and abdominal distension - will obtain echo, pending results likely would plan a GXT for her. - diuretic causes some fatigue, will change her maxide to 1/2 tablet prn only  2. HTN - she is on lopressor 25mg  daily.  Will change to 12.5 mg bid based on medication half life would do better with bid dosing.     F/u pending test results       Arnoldo Lenis, M.D.

## 2014-12-18 ENCOUNTER — Ambulatory Visit (INDEPENDENT_AMBULATORY_CARE_PROVIDER_SITE_OTHER): Payer: BLUE CROSS/BLUE SHIELD

## 2014-12-18 ENCOUNTER — Other Ambulatory Visit: Payer: Self-pay

## 2014-12-18 DIAGNOSIS — R0602 Shortness of breath: Secondary | ICD-10-CM

## 2014-12-20 ENCOUNTER — Telehealth: Payer: Self-pay | Admitting: Cardiology

## 2014-12-20 ENCOUNTER — Telehealth: Payer: Self-pay | Admitting: *Deleted

## 2014-12-20 NOTE — Telephone Encounter (Signed)
Spoke with pt, please see result phone note

## 2014-12-20 NOTE — Telephone Encounter (Signed)
-----   Message from Arnoldo Lenis, MD sent at 12/19/2014 10:58 AM EST ----- Echo overall looks good. Heart pumping funciton is normal. There is some age related thickening/stiffness of the heart muscle that is common and could cause some SOB. Is patient still having symptoms, if so please obtain GXT with O2 sats.   Zandra Abts MD

## 2014-12-20 NOTE — Telephone Encounter (Signed)
Jaclyn Schmidt is returning a call.

## 2014-12-20 NOTE — Telephone Encounter (Signed)
Pt verbalized understanding however declined stress test at this time. Has f/u with pcp this month and will call back if she decides to proceed with stress test. Pt says symptoms are only SOB when walking up hilll.

## 2015-11-28 ENCOUNTER — Encounter: Payer: Self-pay | Admitting: Physician Assistant

## 2015-11-28 ENCOUNTER — Ambulatory Visit (INDEPENDENT_AMBULATORY_CARE_PROVIDER_SITE_OTHER): Payer: BLUE CROSS/BLUE SHIELD | Admitting: Physician Assistant

## 2015-11-28 VITALS — BP 114/78 | HR 82 | Temp 97.3°F | Ht 63.0 in | Wt 152.8 lb

## 2015-11-28 DIAGNOSIS — N3000 Acute cystitis without hematuria: Secondary | ICD-10-CM | POA: Diagnosis not present

## 2015-11-28 DIAGNOSIS — R300 Dysuria: Secondary | ICD-10-CM

## 2015-11-28 LAB — URINALYSIS, COMPLETE
Bilirubin, UA: NEGATIVE
Glucose, UA: NEGATIVE
Ketones, UA: NEGATIVE
Leukocytes, UA: NEGATIVE
Nitrite, UA: NEGATIVE
Protein, UA: NEGATIVE
Specific Gravity, UA: 1.03 (ref 1.005–1.030)
Urobilinogen, Ur: 0.2 mg/dL (ref 0.2–1.0)
pH, UA: 5 (ref 5.0–7.5)

## 2015-11-28 LAB — MICROSCOPIC EXAMINATION

## 2015-11-28 MED ORDER — SULFAMETHOXAZOLE-TRIMETHOPRIM 800-160 MG PO TABS: 1.0000 | ORAL_TABLET | Freq: Two times a day (BID) | ORAL | 0 refills | Status: DC

## 2015-11-28 NOTE — Patient Instructions (Signed)

## 2015-11-28 NOTE — Progress Notes (Signed)
BP 114/78   Pulse 82   Temp 97.3 F (36.3 C) (Oral)   Ht 5\' 3"  (1.6 m)   Wt 152 lb 12.8 oz (69.3 kg)   BMI 27.07 kg/m    Subjective:    Patient ID: Jaclyn Schmidt, female    DOB: 03-28-1950, 65 y.o.   MRN: [id number]  HPI: Jaclyn Schmidt is a 65 y.o. female presenting on 11/28/2015 for Dysuria and odor when urinates  Patient has had urethral burning for several days now. She started taking Azo-Standard over-the-counter and had some relief. She stopped it a couple days ago in order to come have her urine checked here. She has only had a couple of other urinary tract infections ever in her life. She has been having to wear a pad more often because she is having more difficulty with her irritable bowel syndrome and frequent diarrhea. She denies any fever or chills. She denies any pain up into her abdomen or flank. It is all suprapubic and urethral.  Past Medical History:  Diagnosis Date  . Anxiety   . Cholelithiasis   . GERD (gastroesophageal reflux disease)   . Hyperlipidemia   . Hypertension   . Hypothyroidism   . Irritable bowel syndrome   . Thyroid disease   . Vitamin D deficiency    Relevant past medical, surgical, family and social history reviewed and updated as indicated. Interim medical history since our last visit reviewed. Allergies and medications reviewed and updated. DATA REVIEWED: CHART IN EPIC  Social History   Social History  . Marital status: Legally Separated    Spouse name: N/A  . Number of children: N/A  . Years of education: N/A   Occupational History  . Not on file.   Social History Main Topics  . Smoking status: Former Smoker    Packs/day: 1.00    Years: 36.00    Types: Cigarettes    Start date: 09/30/1973    Quit date: 01/18/2009  . Smokeless tobacco: Never Used  . Alcohol use No  . Drug use: No  . Sexual activity: Not on file   Other Topics Concern  . Not on file   Social History Narrative  . No narrative on file    Past Surgical  History:  Procedure Laterality Date  . ABDOMINAL HYSTERECTOMY  [id number]  . APPENDECTOMY  1996  . BREAST MASS EXCISION  1997   left  . CHOLECYSTECTOMY  07/15/2011   Procedure: LAPAROSCOPIC CHOLECYSTECTOMY WITH INTRAOPERATIVE CHOLANGIOGRAM;  Surgeon: Shann Medal, MD;  Location: [facility];  Service: General;  Laterality: N/A;  Cholecystectomy with IOC  . TUBAL LIGATION  1980    Family History  Problem Relation Age of Onset  . Cancer Mother     stomach  . Heart disease Father   . Cancer Maternal Aunt     ovarian  . Cancer Paternal Aunt     breast    Review of Systems  Constitutional: Negative.   HENT: Negative.   Eyes: Negative.   Respiratory: Negative.   Gastrointestinal: Negative.   Genitourinary: Positive for difficulty urinating, dysuria and urgency. Negative for flank pain.      Medication List       Accurate as of 11/28/15 11:35 AM. Always use your most recent med list.          ALPRAZolam 1 MG tablet Commonly known as:  XANAX Take 0.5 tablets by mouth 3 (three) times daily.   BIOTIN MAXIMUM STRENGTH 10 MG Tabs  Generic drug:  Biotin Take 1 tablet by mouth daily.   cholestyramine 4 GM/DOSE powder Commonly known as:  QUESTRAN Take 4 g by mouth 2 (two) times daily as needed.   cyclobenzaprine 10 MG tablet Commonly known as:  FLEXERIL Take 10 mg by mouth as needed.   fluticasone 50 MCG/ACT nasal spray Commonly known as:  FLONASE Place 2 sprays into both nostrils daily.   levothyroxine 88 MCG tablet Commonly known as:  SYNTHROID, LEVOTHROID Take 88 mcg by mouth daily.   pravastatin 40 MG tablet Commonly known as:  PRAVACHOL Take 40 mg by mouth daily.   sulfamethoxazole-trimethoprim 800-160 MG tablet Commonly known as:  BACTRIM DS Take 1 tablet by mouth 2 (two) times daily.   triamterene-hydrochlorothiazide 37.5-25 MG tablet Commonly known as:  MAXZIDE-25 Take 1/2 tab daily as needed for swelling.   VITAMIN D PO Take 2,000 Units by mouth  daily.          Objective:    BP 114/78   Pulse 82   Temp 97.3 F (36.3 C) (Oral)   Ht 5\' 3"  (1.6 m)   Wt 152 lb 12.8 oz (69.3 kg)   BMI 27.07 kg/m   Allergies  Allergen Reactions  . Penicillins Rash    Wt Readings from Last 3 Encounters:  11/28/15 152 lb 12.8 oz (69.3 kg)  12/05/14 151 lb (68.5 kg)  12/17/13 148 lb (67.1 kg)    Physical Exam  Constitutional: She is oriented to person, place, and time. She appears well-developed and well-nourished.  HENT:  Head: Normocephalic and atraumatic.  Eyes: Conjunctivae are normal. Pupils are equal, round, and reactive to light.  Cardiovascular: Normal rate, regular rhythm, normal heart sounds and intact distal pulses.   Pulmonary/Chest: Effort normal and breath sounds normal.  Abdominal: Soft. Bowel sounds are normal. She exhibits no distension and no mass. There is tenderness in the suprapubic area. There is no rebound, no guarding and no CVA tenderness.  Neurological: She is alert and oriented to person, place, and time. She has normal reflexes.  Skin: Skin is warm and dry. No rash noted.  Psychiatric: She has a normal mood and affect. Her behavior is normal. Judgment and thought content normal.        Assessment & Plan:   1. Dysuria - Urinalysis, Complete - Urine culture - sulfamethoxazole-trimethoprim (BACTRIM DS) 800-160 MG tablet; Take 1 tablet by mouth 2 (two) times daily.  Dispense: 14 tablet; Refill: 0  2. Acute cystitis without hematuria - sulfamethoxazole-trimethoprim (BACTRIM DS) 800-160 MG tablet; Take 1 tablet by mouth 2 (two) times daily.  Dispense: 14 tablet; Refill: 0   Continue all other maintenance medications as listed above.  Follow up plan: Prn or worsening.  Orders Placed This Encounter  Procedures  . Urine culture  . Urinalysis, Complete    Educational handout given for UTI  [name] PA-C [facility] [address]  [place], [place]  [address] [phone]   11/28/2015, 11:35 AM

## 2015-11-29 LAB — URINE CULTURE: Organism ID, Bacteria: NO GROWTH

## 2016-01-14 ENCOUNTER — Encounter: Payer: Self-pay | Admitting: Physician Assistant

## 2016-01-14 ENCOUNTER — Ambulatory Visit (INDEPENDENT_AMBULATORY_CARE_PROVIDER_SITE_OTHER): Payer: BLUE CROSS/BLUE SHIELD | Admitting: Physician Assistant

## 2016-01-14 VITALS — BP 108/74 | HR 76 | Ht 63.0 in | Wt 149.0 lb

## 2016-01-14 DIAGNOSIS — E039 Hypothyroidism, unspecified: Secondary | ICD-10-CM

## 2016-01-14 DIAGNOSIS — Z0000 Encounter for general adult medical examination without abnormal findings: Secondary | ICD-10-CM

## 2016-01-14 MED ORDER — LEVOTHYROXINE SODIUM 88 MCG PO TABS: 88.0000 ug | ORAL_TABLET | Freq: Every day | ORAL | 3 refills | Status: DC

## 2016-01-14 MED ORDER — ALPRAZOLAM 1 MG PO TABS: 0.5000 mg | ORAL_TABLET | Freq: Three times a day (TID) | ORAL | 1 refills | Status: DC

## 2016-01-14 MED ORDER — MECLIZINE HCL 25 MG PO TABS: 25.0000 mg | ORAL_TABLET | Freq: Three times a day (TID) | ORAL | 1 refills | Status: DC | PRN

## 2016-01-14 MED ORDER — PRAVASTATIN SODIUM 40 MG PO TABS: 40.0000 mg | ORAL_TABLET | Freq: Every day | ORAL | 3 refills | Status: DC

## 2016-01-14 NOTE — Patient Instructions (Signed)
Labyrinthitis Introduction Labyrinthitis is an infection of the inner ear. Your inner ear is a system of tubes and canals (labyrinth). These are filled with fluid. Nerve cells in your inner ear send signals for hearing and balance to your brain. When tiny germs get inside the tubes and canals, they harm the cells that send messages to the brain. This can cause changes in hearing and balance. Follow these instructions at home:  Take medicines only as told by your doctor.  If you were prescribed an antibiotic medicine, finish all of it even if you start to feel better.  Rest as much as possible.  Avoid loud noises and bright lights.  Do not make sudden movements until any dizziness goes away.  Do not drive until your doctor says that you can.  Drink enough fluid to keep your pee (urine) clear or pale yellow.  Work with a physical therapist if you still feel dizzy after several weeks. A therapist can teach you exercises to help you deal with your dizziness.  Keep all follow-up visits as told by your doctor. This is important. Contact a doctor if:  Your symptoms do not get better with medicines.  You do not get better after two weeks.  You have a fever. Get help right away if:  You are very dizzy.  You keep throwing up (vomiting) or keep feeling sick to your stomach (nauseous).  Your hearing gets a lot worse very quickly. This information is not intended to replace advice given to you by your health care provider. Make sure you discuss any questions you have with your health care provider. Document Released: 01/04/2005 Document Revised: 06/12/2015 Document Reviewed: 10/16/2013  2017 [employer]  

## 2016-01-15 LAB — LIPID PANEL
Chol/HDL Ratio: 3.3 ratio units (ref 0.0–4.4)
Cholesterol, Total: 172 mg/dL (ref 100–199)
HDL: 52 mg/dL (ref >39)
LDL Calculated: 91 mg/dL (ref 0–99)
Triglycerides: 145 mg/dL (ref 0–149)
VLDL Cholesterol Cal: 29 mg/dL (ref 5–40)

## 2016-01-15 LAB — CMP14+EGFR
ALT: 16 IU/L (ref 0–32)
AST: 20 IU/L (ref 0–40)
Albumin/Globulin Ratio: 1.8 (ref 1.2–2.2)
Albumin: 4.6 g/dL (ref 3.6–4.8)
Alkaline Phosphatase: 94 IU/L (ref 39–117)
BUN/Creatinine Ratio: 14 (ref 12–28)
BUN: 10 mg/dL (ref 8–27)
Bilirubin Total: 0.7 mg/dL (ref 0.0–1.2)
CO2: 24 mmol/L (ref 18–29)
Calcium: 9.4 mg/dL (ref 8.7–10.3)
Chloride: 99 mmol/L (ref 96–106)
Creatinine, Ser: 0.74 mg/dL (ref 0.57–1.00)
GFR calc Af Amer: 98 mL/min/{1.73_m2} (ref >59)
GFR calc non Af Amer: 85 mL/min/{1.73_m2} (ref >59)
Globulin, Total: 2.6 g/dL (ref 1.5–4.5)
Glucose: 96 mg/dL (ref 65–99)
Potassium: 4.5 mmol/L (ref 3.5–5.2)
Sodium: 141 mmol/L (ref 134–144)
Total Protein: 7.2 g/dL (ref 6.0–8.5)

## 2016-01-15 LAB — CBC WITH DIFFERENTIAL/PLATELET
Basophils Absolute: 0 10*3/uL (ref 0.0–0.2)
Basos: 0 %
EOS (ABSOLUTE): 0.1 10*3/uL (ref 0.0–0.4)
Eos: 1 %
Hematocrit: 41.3 % (ref 34.0–46.6)
Hemoglobin: 13.9 g/dL (ref 11.1–15.9)
Immature Grans (Abs): 0 10*3/uL (ref 0.0–0.1)
Immature Granulocytes: 0 %
Lymphocytes Absolute: 1.9 10*3/uL (ref 0.7–3.1)
Lymphs: 40 %
MCH: 29.8 pg (ref 26.6–33.0)
MCHC: 33.7 g/dL (ref 31.5–35.7)
MCV: 88 fL (ref 79–97)
Monocytes Absolute: 0.5 10*3/uL (ref 0.1–0.9)
Monocytes: 11 %
Neutrophils Absolute: 2.3 10*3/uL (ref 1.4–7.0)
Neutrophils: 48 %
Platelets: 310 10*3/uL (ref 150–379)
RBC: 4.67 x10E6/uL (ref 3.77–5.28)
RDW: 13 % (ref 12.3–15.4)
WBC: 4.9 10*3/uL (ref 3.4–10.8)

## 2016-01-15 LAB — TSH: TSH: 16.68 u[IU]/mL — ABNORMAL HIGH (ref 0.450–4.500)

## 2016-01-18 NOTE — Progress Notes (Signed)
BP 108/74   Pulse 76   Ht '5\' 3"'  (1.6 m)   Wt 149 lb (67.6 kg)   BMI 26.39 kg/m    Subjective:    Patient ID: Jaclyn Schmidt, female    DOB: 09-Jul-1950, 65 y.o.   MRN: [id number]  Jaclyn Schmidt is a 65 y.o. female presenting on 01/14/2016 for Annual Exam (pt here today for CPE)  HPI Patient here to be established as new patient at [place].  This patient is known to me from [facility]. This patient comes in for annual well physical examination. All medications are reviewed today. There are no reports of any problems with the medications. All of the medical conditions are reviewed and updated.  Lab work is reviewed and will be ordered as medically necessary. There are no new problems reported with today's visit.  Patient reports doing well overall.   Past Medical History:  Diagnosis Date  . Anxiety   . Cholelithiasis   . GERD (gastroesophageal reflux disease)   . Hyperlipidemia   . Hypertension   . Hypothyroidism   . Irritable bowel syndrome   . Thyroid disease   . Vitamin D deficiency    Relevant past medical, surgical, family and social history reviewed and updated as indicated. Interim medical history since our last visit reviewed. Allergies and medications reviewed and updated.   Data reviewed from any sources in EPIC.  Review of Systems  Constitutional: Negative.  Negative for activity change, fatigue and fever.  HENT: Negative.   Eyes: Negative.   Respiratory: Negative.  Negative for cough.   Cardiovascular: Negative.  Negative for chest pain.  Gastrointestinal: Negative.  Negative for abdominal pain.  Endocrine: Negative.   Genitourinary: Negative.  Negative for dysuria.  Musculoskeletal: Negative.   Skin: Negative.   Neurological: Negative.      Social History   Social History  . Marital status: Legally Separated    Spouse name: N/A  . Number of children: N/A  . Years of education: N/A   Occupational History  .  Not on file.   Social History Main Topics  . Smoking status: Former Smoker    Packs/day: 1.00    Years: 36.00    Types: Cigarettes    Start date: 09/30/1973    Quit date: 01/18/2009  . Smokeless tobacco: Never Used  . Alcohol use No  . Drug use: No  . Sexual activity: Not on file   Other Topics Concern  . Not on file   Social History Narrative  . No narrative on file    Past Surgical History:  Procedure Laterality Date  . ABDOMINAL HYSTERECTOMY  [id number]  . APPENDECTOMY  1996  . BREAST MASS EXCISION  1997   left  . CHOLECYSTECTOMY  07/15/2011   Procedure: LAPAROSCOPIC CHOLECYSTECTOMY WITH INTRAOPERATIVE CHOLANGIOGRAM;  Surgeon: Shann Medal, MD;  Location: [facility];  Service: General;  Laterality: N/A;  Cholecystectomy with IOC  . TUBAL LIGATION  1980    Family History  Problem Relation Age of Onset  . Cancer Mother     stomach  . Heart disease Father   . Cancer Maternal Aunt     ovarian  . Cancer Paternal Aunt     breast    Allergies as of 01/14/2016      Reactions   Penicillins Rash      Medication List       Accurate as of 01/14/16 11:59 PM. Always use your most recent  med list.          ALPRAZolam 1 MG tablet Commonly known as:  XANAX Take 0.5 tablets (0.5 mg total) by mouth 3 (three) times daily.   BIOTIN MAXIMUM STRENGTH 10 MG Tabs Generic drug:  Biotin Take 1 tablet by mouth daily.   cholestyramine 4 GM/DOSE powder Commonly known as:  QUESTRAN Take 4 g by mouth 2 (two) times daily as needed.   cyclobenzaprine 10 MG tablet Commonly known as:  FLEXERIL Take 10 mg by mouth as needed.   fluticasone 50 MCG/ACT nasal spray Commonly known as:  FLONASE Place 2 sprays into both nostrils daily.   hyoscyamine 0.125 MG SL tablet Commonly known as:  LEVSIN SL TAKE 2 TABLETS FOUR TIMES A DAY AS NEEDED SUBLINGUAL 30 DAYS   levothyroxine 88 MCG tablet Commonly known as:  SYNTHROID, LEVOTHROID Take 1 tablet (88 mcg total) by mouth daily.     meclizine 25 MG tablet Commonly known as:  ANTIVERT Take 1 tablet (25 mg total) by mouth 3 (three) times daily as needed for dizziness.   pravastatin 40 MG tablet Commonly known as:  PRAVACHOL Take 1 tablet (40 mg total) by mouth daily.   VITAMIN D PO Take 2,000 Units by mouth daily.          Objective:    BP 108/74   Pulse 76   Ht '5\' 3"'  (1.6 m)   Wt 149 lb (67.6 kg)   BMI 26.39 kg/m   Allergies  Allergen Reactions  . Penicillins Rash   Wt Readings from Last 3 Encounters:  01/14/16 149 lb (67.6 kg)  11/28/15 152 lb 12.8 oz (69.3 kg)  12/05/14 151 lb (68.5 kg)    Physical Exam  Constitutional: She is oriented to person, place, and time. She appears well-developed and well-nourished.  HENT:  Head: Normocephalic and atraumatic.  Right Ear: Tympanic membrane, external ear and ear canal normal.  Left Ear: Tympanic membrane, external ear and ear canal normal.  Nose: Nose normal. No rhinorrhea.  Mouth/Throat: Oropharynx is clear and moist and mucous membranes are normal. No oropharyngeal exudate or posterior oropharyngeal erythema.  Eyes: Conjunctivae and EOM are normal. Pupils are equal, round, and reactive to light.  Neck: Normal range of motion. Neck supple.  Cardiovascular: Normal rate, regular rhythm, normal heart sounds and intact distal pulses.   Pulmonary/Chest: Effort normal and breath sounds normal. Right breast exhibits no mass, no skin change and no tenderness. Left breast exhibits no mass, no skin change and no tenderness. Breasts are symmetrical.  Abdominal: Soft. Bowel sounds are normal.  Genitourinary: Vagina normal and uterus normal. Rectal exam shows no fissure. No breast swelling, tenderness, discharge or bleeding. There is no tenderness or lesion on the right labia. There is no tenderness or lesion on the left labia. Uterus is not deviated, not enlarged and not tender. Cervix exhibits no motion tenderness, no discharge and no friability. Right adnexum  displays no mass, no tenderness and no fullness. Left adnexum displays no mass, no tenderness and no fullness. No tenderness or bleeding in the vagina. No vaginal discharge found.  Neurological: She is alert and oriented to person, place, and time. She has normal reflexes.  Skin: Skin is warm and dry. No rash noted.  Psychiatric: She has a normal mood and affect. Her behavior is normal. Judgment and thought content normal.  Nursing note and vitals reviewed.   Results for orders placed or performed in visit on 01/14/16  CBC with Differential/Platelet  Result  Value Ref Range   WBC 4.9 3.4 - 10.8 x10E3/uL   RBC 4.67 3.77 - 5.28 x10E6/uL   Hemoglobin 13.9 11.1 - 15.9 g/dL   Hematocrit 41.3 34.0 - 46.6 %   MCV 88 79 - 97 fL   MCH 29.8 26.6 - 33.0 pg   MCHC 33.7 31.5 - 35.7 g/dL   RDW 13.0 12.3 - 15.4 %   Platelets 310 150 - 379 x10E3/uL   Neutrophils 48 Not Estab. %   Lymphs 40 Not Estab. %   Monocytes 11 Not Estab. %   Eos 1 Not Estab. %   Basos 0 Not Estab. %   Neutrophils Absolute 2.3 1.4 - 7.0 x10E3/uL   Lymphocytes Absolute 1.9 0.7 - 3.1 x10E3/uL   Monocytes Absolute 0.5 0.1 - 0.9 x10E3/uL   EOS (ABSOLUTE) 0.1 0.0 - 0.4 x10E3/uL   Basophils Absolute 0.0 0.0 - 0.2 x10E3/uL   Immature Granulocytes 0 Not Estab. %   Immature Grans (Abs) 0.0 0.0 - 0.1 x10E3/uL  CMP14+EGFR  Result Value Ref Range   Glucose 96 65 - 99 mg/dL   BUN 10 8 - 27 mg/dL   Creatinine, Ser 0.74 0.57 - 1.00 mg/dL   GFR calc non Af Amer 85 >59 mL/min/1.73   GFR calc Af Amer 98 >59 mL/min/1.73   BUN/Creatinine Ratio 14 12 - 28   Sodium 141 134 - 144 mmol/L   Potassium 4.5 3.5 - 5.2 mmol/L   Chloride 99 96 - 106 mmol/L   CO2 24 18 - 29 mmol/L   Calcium 9.4 8.7 - 10.3 mg/dL   Total Protein 7.2 6.0 - 8.5 g/dL   Albumin 4.6 3.6 - 4.8 g/dL   Globulin, Total 2.6 1.5 - 4.5 g/dL   Albumin/Globulin Ratio 1.8 1.2 - 2.2   Bilirubin Total 0.7 0.0 - 1.2 mg/dL   Alkaline Phosphatase 94 39 - 117 IU/L   AST 20 0 - 40  IU/L   ALT 16 0 - 32 IU/L  Lipid panel  Result Value Ref Range   Cholesterol, Total 172 100 - 199 mg/dL   Triglycerides 145 0 - 149 mg/dL   HDL 52 >39 mg/dL   VLDL Cholesterol Cal 29 5 - 40 mg/dL   LDL Calculated 91 0 - 99 mg/dL   Chol/HDL Ratio 3.3 0.0 - 4.4 ratio units  TSH  Result Value Ref Range   TSH 16.680 (H) 0.450 - 4.500 uIU/mL      Assessment & Plan:   1. Well adult exam - hyoscyamine (LEVSIN SL) 0.125 MG SL tablet; TAKE 2 TABLETS FOUR TIMES A DAY AS NEEDED SUBLINGUAL 30 DAYS; Refill: 3 - meclizine (ANTIVERT) 25 MG tablet; Take 1 tablet (25 mg total) by mouth 3 (three) times daily as needed for dizziness.  Dispense: 90 tablet; Refill: 1 - levothyroxine (SYNTHROID, LEVOTHROID) 88 MCG tablet; Take 1 tablet (88 mcg total) by mouth daily.  Dispense: 90 tablet; Refill: 3 - pravastatin (PRAVACHOL) 40 MG tablet; Take 1 tablet (40 mg total) by mouth daily.  Dispense: 90 tablet; Refill: 3 - ALPRAZolam (XANAX) 1 MG tablet; Take 0.5 tablets (0.5 mg total) by mouth 3 (three) times daily.  Dispense: 120 tablet; Refill: 1 - CBC with Differential/Platelet - CMP14+EGFR - Lipid panel - TSH  2. Hypothyroidism, unspecified type - levothyroxine (SYNTHROID, LEVOTHROID) 88 MCG tablet; Take 1 tablet (88 mcg total) by mouth daily.  Dispense: 90 tablet; Refill: 3 - TSH   Continue all other maintenance medications as listed above. Educational handout  given for health maintenance  Follow up plan: Return in about 6 months (around 07/14/2016) for recheck meds.  [name] PA-C [place] [address]  [place],  [address] [phone]   01/18/2016, 5:28 PM

## 2016-01-21 ENCOUNTER — Telehealth: Payer: Self-pay | Admitting: Physician Assistant

## 2016-01-21 MED ORDER — LEVOTHYROXINE SODIUM 100 MCG PO TABS: 100.0000 ug | ORAL_TABLET | Freq: Every day | ORAL | 3 refills | Status: DC

## 2016-01-21 NOTE — Telephone Encounter (Signed)
Please review and advise.

## 2016-01-21 NOTE — Telephone Encounter (Signed)
I can send a new rx to local pharmacy and new one to [facility].

## 2016-01-21 NOTE — Telephone Encounter (Signed)
Pt notified of RX RX sent into [facility]

## 2016-01-22 ENCOUNTER — Telehealth: Payer: Self-pay | Admitting: Physician Assistant

## 2016-01-22 NOTE — Telephone Encounter (Signed)
-----   Message from Zannie Cove, LPN sent at [id number]  3:06 PM EST ----- Pt aware and wants med sent to [facility] mail order

## 2016-09-21 ENCOUNTER — Encounter: Payer: Self-pay | Admitting: Physician Assistant

## 2016-09-21 ENCOUNTER — Ambulatory Visit (INDEPENDENT_AMBULATORY_CARE_PROVIDER_SITE_OTHER): Payer: BLUE CROSS/BLUE SHIELD | Admitting: Physician Assistant

## 2016-09-21 VITALS — BP 124/71 | HR 89 | Temp 97.5°F | Ht 63.0 in | Wt 148.0 lb

## 2016-09-21 DIAGNOSIS — M545 Low back pain, unspecified: Secondary | ICD-10-CM | POA: Insufficient documentation

## 2016-09-21 DIAGNOSIS — G8929 Other chronic pain: Secondary | ICD-10-CM | POA: Diagnosis not present

## 2016-09-21 DIAGNOSIS — M546 Pain in thoracic spine: Secondary | ICD-10-CM | POA: Diagnosis not present

## 2016-09-21 DIAGNOSIS — L239 Allergic contact dermatitis, unspecified cause: Secondary | ICD-10-CM

## 2016-09-21 MED ORDER — METHYLPREDNISOLONE ACETATE 80 MG/ML IJ SUSP
80.0000 mg | Freq: Once | INTRAMUSCULAR | Status: AC
  Administered 2016-09-21: 80 mg via INTRAMUSCULAR

## 2016-09-21 MED ORDER — CYCLOBENZAPRINE HCL 10 MG PO TABS: 10.0000 mg | ORAL_TABLET | Freq: Three times a day (TID) | ORAL | 2 refills | Status: DC | PRN

## 2016-09-21 NOTE — Patient Instructions (Signed)

## 2016-09-21 NOTE — Progress Notes (Signed)
BP 124/71   Pulse 89   Temp (!) 97.5 F (36.4 C) (Oral)   Ht 5\' 3"  (1.6 m)   Wt 148 lb (67.1 kg)   BMI 26.22 kg/m    Subjective:    Patient ID: Jaclyn Schmidt, female    DOB: [birth year], 66 y.o.   MRN: [id number]  HPI: Jaclyn Schmidt is a 66 y.o. female presenting on 09/21/2016 for [facility]  Patient comes in with a few days of allergic dermatitis. She knows she was walking around her fence line 3 days ago and was exposed there. The areas on  her lower leg has a lot of itching. She also has some on her arms and trunk. She has tried over-the-counter itching medication without any benefit.  She is also having continued thoracic and lumbar pain. She had gone to a chiropractor in the past and it it felt good to have adjustments. 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  Relevant past medical, surgical, family and social history reviewed and updated as indicated. Allergies and medications reviewed and updated.  Past Medical History:  Diagnosis Date  . Anxiety   . Cholelithiasis   . GERD (gastroesophageal reflux disease)   . Hyperlipidemia   . Hypertension   . Hypothyroidism   . Irritable bowel syndrome   . Thyroid disease   . Vitamin D deficiency     Past Surgical History:  Procedure Laterality Date  . ABDOMINAL HYSTERECTOMY  [id number]  . APPENDECTOMY  1996  . BREAST MASS EXCISION  1997   left  . CHOLECYSTECTOMY  07/15/2011   Procedure: LAPAROSCOPIC CHOLECYSTECTOMY WITH INTRAOPERATIVE CHOLANGIOGRAM;  Surgeon: Shann Medal, MD;  Location: [facility];  Service: General;  Laterality: N/A;  Cholecystectomy with IOC  . TUBAL LIGATION  1980    Review of Systems  Constitutional: Negative.  Negative for activity change, fatigue and fever.  HENT: Negative.   Eyes: Negative.   Respiratory: Negative.  Negative for cough.   Cardiovascular: Negative.  Negative for chest pain.  Gastrointestinal: Negative.  Negative for abdominal pain.    Endocrine: Negative.   Genitourinary: Negative.  Negative for dysuria.  Musculoskeletal: Positive for arthralgias, back pain and myalgias.  Skin: Positive for color change and rash.  Neurological: Negative.     Allergies as of 09/21/2016      Reactions   Penicillins Rash      Medication List       Accurate as of 09/21/16  2:50 PM. Always use your most recent med list.          ALPRAZolam 1 MG tablet Commonly known as:  XANAX Take 0.5 tablets (0.5 mg total) by mouth 3 (three) times daily.   BIOTIN MAXIMUM STRENGTH 10 MG Tabs Generic drug:  Biotin Take 1 tablet by mouth daily.   cholestyramine 4 GM/DOSE powder Commonly known as:  QUESTRAN Take 4 g by mouth 2 (two) times daily as needed.   cyclobenzaprine 10 MG tablet Commonly known as:  FLEXERIL Take 1 tablet (10 mg total) by mouth 3 (three) times daily as needed for muscle spasms.   fluticasone 50 MCG/ACT nasal spray Commonly known as:  FLONASE Place 2 sprays into both nostrils daily.   hyoscyamine 0.125 MG SL tablet Commonly known as:  LEVSIN SL TAKE 2 TABLETS FOUR TIMES A DAY AS NEEDED SUBLINGUAL 30 DAYS   levothyroxine 88 MCG tablet Commonly known as:  SYNTHROID, LEVOTHROID  Take 88 mcg by mouth daily before breakfast.   meclizine 25 MG tablet Commonly known as:  ANTIVERT Take 1 tablet (25 mg total) by mouth 3 (three) times daily as needed for dizziness.   pravastatin 40 MG tablet Commonly known as:  PRAVACHOL Take 1 tablet (40 mg total) by mouth daily.   VITAMIN D PO Take 2,000 Units by mouth daily.            Discharge Care Instructions        Start     Ordered   09/21/16 1445  METHYLPREDNISOLONE ACETATE 80 MG/ML IJ SUSP   Once     09/21/16 1443   09/21/16 0000  cyclobenzaprine (FLEXERIL) 10 MG tablet  3 times daily PRN    Question:  Supervising Provider  Answer:  Timmothy Euler   09/21/16 1443         Objective:    BP 124/71   Pulse 89   Temp (!) 97.5 F (36.4 C) (Oral)   Ht 5'  3" (1.6 m)   Wt 148 lb (67.1 kg)   BMI 26.22 kg/m   Allergies  Allergen Reactions  . Penicillins Rash    Physical Exam  Constitutional: She is oriented to person, place, and time. She appears well-developed and well-nourished.  HENT:  Head: Normocephalic and atraumatic.  Eyes: Pupils are equal, round, and reactive to light. Conjunctivae and EOM are normal.  Cardiovascular: Normal rate, regular rhythm, normal heart sounds and intact distal pulses.   Pulmonary/Chest: Effort normal and breath sounds normal.  Abdominal: Soft. Bowel sounds are normal.  Musculoskeletal:       Thoracic back: She exhibits decreased range of motion, tenderness, pain and spasm.       Lumbar back: She exhibits decreased range of motion, tenderness and spasm.       Back:  Neurological: She is alert and oriented to person, place, and time. She has normal reflexes.  Skin: Skin is warm and dry. Rash noted. Rash is vesicular. There is erythema.  Arms and legs with vesicular lesions  Psychiatric: She has a normal mood and affect. Her behavior is normal. Judgment and thought content normal.        Assessment & Plan:   1. Allergic contact dermatitis, unspecified trigger - methylPREDNISolone acetate (DEPO-MEDROL) injection 80 mg; Inject 1 mL (80 mg total) into the muscle once.  2. Chronic midline thoracic back pain - cyclobenzaprine (FLEXERIL) 10 MG tablet; Take 1 tablet (10 mg total) by mouth 3 (three) times daily as needed for muscle spasms.  Dispense: 90 tablet; Refill: 2  3. Lumbar pain - cyclobenzaprine (FLEXERIL) 10 MG tablet; Take 1 tablet (10 mg total) by mouth 3 (three) times daily as needed for muscle spasms.  Dispense: 90 tablet; Refill: 2    Current Outpatient Prescriptions:  .  ALPRAZolam (XANAX) 1 MG tablet, Take 0.5 tablets (0.5 mg total) by mouth 3 (three) times daily., Disp: 120 tablet, Rfl: 1 .  Biotin (BIOTIN MAXIMUM STRENGTH) 10 MG TABS, Take 1 tablet by mouth daily., Disp: , Rfl:  .   Cholecalciferol (VITAMIN D PO), Take 2,000 Units by mouth daily. , Disp: , Rfl:  .  cholestyramine (QUESTRAN) 4 GM/DOSE powder, Take 4 g by mouth 2 (two) times daily as needed., Disp: , Rfl:  .  fluticasone (FLONASE) 50 MCG/ACT nasal spray, Place 2 sprays into both nostrils daily. , Disp: , Rfl: 2 .  hyoscyamine (LEVSIN SL) 0.125 MG SL tablet, TAKE 2 TABLETS FOUR  TIMES A DAY AS NEEDED SUBLINGUAL 30 DAYS, Disp: , Rfl: 3 .  levothyroxine (SYNTHROID, LEVOTHROID) 88 MCG tablet, Take 88 mcg by mouth daily before breakfast., Disp: , Rfl:  .  meclizine (ANTIVERT) 25 MG tablet, Take 1 tablet (25 mg total) by mouth 3 (three) times daily as needed for dizziness., Disp: 90 tablet, Rfl: 1 .  pravastatin (PRAVACHOL) 40 MG tablet, Take 1 tablet (40 mg total) by mouth daily., Disp: 90 tablet, Rfl: 3 .  cyclobenzaprine (FLEXERIL) 10 MG tablet, Take 1 tablet (10 mg total) by mouth 3 (three) times daily as needed for muscle spasms., Disp: 90 tablet, Rfl: 2  Current Facility-Administered Medications:  .  methylPREDNISolone acetate (DEPO-MEDROL) injection 80 mg, 80 mg, Intramuscular, Once, Jaclyn Walsh S, PA-C Continue all other maintenance medications as listed above.  Follow up plan: Return if symptoms worsen or fail to improve.  Educational handout given for back exercises  Jaclyn Sleeper PA-C [place] [address]  [place],  [address] [phone]   09/21/2016, 2:50 PM

## 2016-09-27 ENCOUNTER — Telehealth: Payer: Self-pay | Admitting: Physician Assistant

## 2016-09-27 NOTE — Telephone Encounter (Signed)
Yes, okay to write note

## 2016-09-27 NOTE — Telephone Encounter (Signed)
Letter Faxed

## 2016-10-21 ENCOUNTER — Ambulatory Visit (INDEPENDENT_AMBULATORY_CARE_PROVIDER_SITE_OTHER): Payer: BLUE CROSS/BLUE SHIELD | Admitting: Orthopaedic Surgery

## 2016-10-28 ENCOUNTER — Ambulatory Visit (INDEPENDENT_AMBULATORY_CARE_PROVIDER_SITE_OTHER): Payer: BLUE CROSS/BLUE SHIELD

## 2016-10-28 ENCOUNTER — Ambulatory Visit (INDEPENDENT_AMBULATORY_CARE_PROVIDER_SITE_OTHER): Payer: BLUE CROSS/BLUE SHIELD | Admitting: Orthopaedic Surgery

## 2016-10-28 ENCOUNTER — Encounter (INDEPENDENT_AMBULATORY_CARE_PROVIDER_SITE_OTHER): Payer: Self-pay | Admitting: Orthopaedic Surgery

## 2016-10-28 VITALS — BP 112/73 | HR 83 | Ht 62.0 in | Wt 145.0 lb

## 2016-10-28 DIAGNOSIS — M545 Low back pain: Secondary | ICD-10-CM

## 2016-10-28 DIAGNOSIS — M542 Cervicalgia: Secondary | ICD-10-CM

## 2016-10-28 DIAGNOSIS — G8929 Other chronic pain: Secondary | ICD-10-CM | POA: Diagnosis not present

## 2016-11-01 NOTE — Progress Notes (Signed)
Office Visit Note   Patient: Jaclyn Schmidt           Date of Birth: 1950/08/25           MRN: [id number] Visit Date: 10/28/2016              Requested by: [name], PA-C [address] [place], [place] [address] PCP: [name], PA-C   Assessment & Plan: Visit Diagnoses:  1. Neck pain   2. Chronic bilateral low back pain, with sciatica presence unspecified     Plan: Patient has some evidence of L2-3 disc degeneration with disc space narrowing and spurring. Multilevel spondylosis in the cervical spine with disc space narrowing and some spurring. She neurologically intact will set up for some physical therapy for treatment of both her cervical spondylosis as well as low back pain and I will recheck her again in 5 weeks for follow-up.  Follow-Up Instructions: Return in about 5 weeks (around 12/02/2016).   Orders:  Orders Placed This Encounter  Procedures  . XR Cervical Spine 2 or 3 views  . XR Lumbar Spine 2-3 Views   No orders of the defined types were placed in this encounter.     Procedures: No procedures performed   Clinical Data: No additional findings.   Subjective: Chief Complaint  Patient presents with  . Neck - Pain  . Lower Back - Pain    HPI 67 year old female with back pain low to middle back. She has some pain between her shoulders and into the trapezial region of her neck. No recent injury she denies any radicular symptoms in the upper or lower extremities. She's used Aleve Flexeril for back support. She has neck discomfort with turning and twisting. Associated bowel or bladder symptoms no chills or fever.  Review of Systems 14 point review of systems positive previous appendectomy. Partial hysterectomy and bladder tack up procedure as well as foot surgery in the distant past. Patient has has a reflux hypertension and thyroid condition for which she takes a  thyroid supplement. She has seasonal allergies also takes vitamin D supplement. Otherwise  negative as it pertains history of present illness.   Objective: Vital Signs: BP 112/73   Pulse 83   Ht 5\' 2"  (1.575 m)   Wt 145 lb (65.8 kg)   BMI 26.52 kg/m   Physical Exam  Constitutional: She is oriented to person, place, and time. She appears well-developed.  HENT:  Head: Normocephalic.  Right Ear: External ear normal.  Left Ear: External ear normal.  Eyes: Pupils are equal, round, and reactive to light.  Neck: No tracheal deviation present. No thyromegaly present.  Cardiovascular: Normal rate.   Pulmonary/Chest: Effort normal.  Abdominal: Soft.  Neurological: She is alert and oriented to person, place, and time.  Skin: Skin is warm and dry.  Psychiatric: She has a normal mood and affect. Her behavior is normal.    Ortho Exam patient is mild trapezial tenderness. 4 flexion chin 2 finger breast and the chest.No Supraclavicular lymphadenopathy. Upper and lower extremity reflexes are 2+ and symmetrical. No atrophy no pitting edema. Negative straight leg raising 90. Anterior tib EHL is intact normal heel toe gait. She is able to heel and toe walk. Negative shoulder impingement.  Specialty Comments:  No specialty comments available.  Imaging: No results found.   PMFS History: Patient Active Problem List   Diagnosis Date Noted  . Chronic midline thoracic back pain 09/21/2016  . Lumbar pain 09/21/2016  .  Gall bladder disease 06/10/2011  . Cholelithiasis 06/03/2011   Past Medical History:  Diagnosis Date  . Anxiety   . Cholelithiasis   . GERD (gastroesophageal reflux disease)   . Hyperlipidemia   . Hypertension   . Hypothyroidism   . Irritable bowel syndrome   . Thyroid disease   . Vitamin D deficiency     Family History  Problem Relation Age of Onset  . Cancer Mother        stomach  . Heart disease Father   . Cancer Maternal Aunt        ovarian  . Cancer Paternal Aunt        breast    Past Surgical History:  Procedure Laterality Date  . ABDOMINAL  HYSTERECTOMY  [id number]  . APPENDECTOMY  1996  . BREAST MASS EXCISION  1997   left  . CHOLECYSTECTOMY  07/15/2011   Procedure: LAPAROSCOPIC CHOLECYSTECTOMY WITH INTRAOPERATIVE CHOLANGIOGRAM;  Surgeon: Shann Medal, MD;  Location: [facility];  Service: General;  Laterality: N/A;  Cholecystectomy with IOC  . TUBAL LIGATION  1980   Social History   Occupational History  . Not on file.   Social History Main Topics  . Smoking status: Current Every Day Smoker    Packs/day: 1.00    Years: 36.00    Types: Cigarettes    Start date: 09/30/1973    Last attempt to quit: 01/18/2009  . Smokeless tobacco: Never Used     Comment: pt started smoking 5-6 cig a day 08/2016  . Alcohol use No  . Drug use: No  . Sexual activity: Not on file

## 2016-12-02 ENCOUNTER — Encounter (INDEPENDENT_AMBULATORY_CARE_PROVIDER_SITE_OTHER): Payer: Self-pay | Admitting: Orthopaedic Surgery

## 2016-12-02 ENCOUNTER — Ambulatory Visit (INDEPENDENT_AMBULATORY_CARE_PROVIDER_SITE_OTHER): Payer: BLUE CROSS/BLUE SHIELD | Admitting: Orthopaedic Surgery

## 2016-12-02 VITALS — BP 136/87 | HR 74 | Ht 62.0 in | Wt 145.0 lb

## 2016-12-02 DIAGNOSIS — M4722 Other spondylosis with radiculopathy, cervical region: Secondary | ICD-10-CM | POA: Diagnosis not present

## 2016-12-02 DIAGNOSIS — M546 Pain in thoracic spine: Secondary | ICD-10-CM | POA: Diagnosis not present

## 2016-12-02 DIAGNOSIS — M545 Low back pain, unspecified: Secondary | ICD-10-CM

## 2016-12-02 DIAGNOSIS — G8929 Other chronic pain: Secondary | ICD-10-CM | POA: Diagnosis not present

## 2016-12-02 NOTE — Progress Notes (Signed)
Office Visit Note   Patient: Jaclyn Schmidt           Date of Birth: [birth year]           MRN: [id number] Visit Date: 12/02/2016              Requested by: [name], PA-C [address] [place], [place] [address] PCP: [name], PA-C   Assessment & Plan: Visit Diagnoses:  1. Chronic midline thoracic back pain   2. Lumbar pain   3. Other spondylosis with radiculopathy, cervical region     Plan: Patient had persistent increasing pain will obtain a cervical MRI scan.  Physical therapy is not improved her symptoms.  Follow-up after cervical MRI scan.  Follow-Up Instructions: Return in about 2 weeks (around 12/16/2016).   Orders:  No orders of the defined types were placed in this encounter.  No orders of the defined types were placed in this encounter.     Procedures: No procedures performed   Clinical Data: No additional findings.   Subjective: Chief Complaint  Patient presents with  . Neck - Follow-up, Pain  . Lower Back - Pain, Follow-up    HPI has been to therapy for the last month.  She states the therapist that she has a lot of tension in her neck.  More pain in the right than left shoulder that radiates down to her hands with numbness in her hands worse on the right than left.  She has problems holding a paper or other objects for period of time.  Increased pain with prolonged standing.  She denies claudication symptoms with ambulation.  Pain in her back is at the lower lumbar region, rates into both buttocks.  13-hour shifts standing.  And walking.  Patient states that the pain is progressed over the last 6 months and she is not sure she is can be able to continue with her job due to increased pain.  Inflammatories of bothered her stomach and Flexeril during the days made her sleepy.  She uses it at night to help her sleep.  Review of Systems review of systems updated unchanged from 10/28/2016 office visit.  She is a recent colonoscopy.  She relates that she  has fecal urgency and Dr. Oletta Lamas states she has about 10% normal tone.  Urinary frequency.   Objective: Vital Signs: BP 136/87   Pulse 74   Ht 5\' 2"  (1.575 m)   Wt 145 lb (65.8 kg)   BMI 26.52 kg/m   Physical Exam  Constitutional: She is oriented to person, place, and time. She appears well-developed.  HENT:  Head: Normocephalic.  Right Ear: External ear normal.  Left Ear: External ear normal.  Eyes: Pupils are equal, round, and reactive to light.  Neck: No tracheal deviation present. No thyromegaly present.  Cardiovascular: Normal rate.  Pulmonary/Chest: Effort normal.  Abdominal: Soft.  Neurological: She is alert and oriented to person, place, and time.  Skin: Skin is warm and dry.  Psychiatric: She has a normal mood and affect. Her behavior is normal.    Ortho Exam greater than left brachial plexus tenderness.  Tenderness superior medial border of the scapula negative drop arm test rotator cuff testing is normal deltoid is strong.  Biceps triceps brachioradialis is intact reflexes are 2+ biceps triceps brachioradialis.  No thenar or hyperthenar atrophy.  Positive Spurling on the right mildly positive on the left.  Negative for me.  Increased back pain with forward flexion fingertips to  mid ankle pain resuming erect position.  Discomfort with lateral bending and extension.  Negative straight leg raising 90 degrees.  Pulses are intact.  Anterior tib gastrocsoleus is strong.  Specialty Comments:  No specialty comments available.  Imaging: No results found.   PMFS History: Patient Active Problem List   Diagnosis Date Noted  . Other spondylosis with radiculopathy, cervical region 12/02/2016  . Chronic midline thoracic back pain 09/21/2016  . Lumbar pain 09/21/2016  . Gall bladder disease 06/10/2011  . Cholelithiasis 06/03/2011   Past Medical History:  Diagnosis Date  . Anxiety   . Cholelithiasis   . GERD (gastroesophageal reflux disease)   . Hyperlipidemia   .  Hypertension   . Hypothyroidism   . Irritable bowel syndrome   . Thyroid disease   . Vitamin D deficiency     Family History  Problem Relation Age of Onset  . Cancer Mother        stomach  . Heart disease Father   . Cancer Maternal Aunt        ovarian  . Cancer Paternal Aunt        breast    Past Surgical History:  Procedure Laterality Date  . ABDOMINAL HYSTERECTOMY  [id number]  . APPENDECTOMY  1996  . BREAST MASS EXCISION  1997   left  . CHOLECYSTECTOMY  07/15/2011   Procedure: LAPAROSCOPIC CHOLECYSTECTOMY WITH INTRAOPERATIVE CHOLANGIOGRAM;  Surgeon: Shann Medal, MD;  Location: [facility];  Service: General;  Laterality: N/A;  Cholecystectomy with IOC  . TUBAL LIGATION  1980   Social History   Occupational History  . Not on file  Tobacco Use  . Smoking status: Current Every Day Smoker    Packs/day: 1.00    Years: 36.00    Pack years: 36.00    Types: Cigarettes    Start date: 09/30/1973    Last attempt to quit: 01/18/2009    Years since quitting: 7.8  . Smokeless tobacco: Never Used  . Tobacco comment: pt started smoking 5-6 cig a day 08/2016  Substance and Sexual Activity  . Alcohol use: No    Alcohol/week: 0.0 oz  . Drug use: No  . Sexual activity: Not on file

## 2016-12-02 NOTE — Addendum Note (Signed)
Addended by: Meyer Cory on: 12/02/2016 02:26 PM   Modules accepted: Orders

## 2016-12-15 ENCOUNTER — Other Ambulatory Visit: Payer: Self-pay | Admitting: *Deleted

## 2016-12-15 DIAGNOSIS — Z0000 Encounter for general adult medical examination without abnormal findings: Secondary | ICD-10-CM

## 2016-12-15 MED ORDER — ALPRAZOLAM 1 MG PO TABS: 0.5000 mg | ORAL_TABLET | Freq: Three times a day (TID) | ORAL | 1 refills | Status: DC

## 2016-12-15 NOTE — Telephone Encounter (Signed)
Xanax refill called to [facility] voice mail per patient request.

## 2016-12-16 ENCOUNTER — Encounter (INDEPENDENT_AMBULATORY_CARE_PROVIDER_SITE_OTHER): Payer: Self-pay | Admitting: Orthopaedic Surgery

## 2016-12-16 ENCOUNTER — Ambulatory Visit (INDEPENDENT_AMBULATORY_CARE_PROVIDER_SITE_OTHER): Payer: BLUE CROSS/BLUE SHIELD | Admitting: Orthopaedic Surgery

## 2016-12-16 VITALS — BP 141/89 | HR 73

## 2016-12-16 DIAGNOSIS — M545 Low back pain, unspecified: Secondary | ICD-10-CM

## 2016-12-16 DIAGNOSIS — M4722 Other spondylosis with radiculopathy, cervical region: Secondary | ICD-10-CM

## 2016-12-16 MED ORDER — PREDNISONE 10 MG (21) PO TBPK: ORAL_TABLET | ORAL | 0 refills | Status: DC

## 2016-12-16 NOTE — Progress Notes (Signed)
Office Visit Note   Patient: Jaclyn Schmidt           Date of Birth: 12-21-1950           MRN: [id number] Visit Date: 12/16/2016              Requested by: [name], PA-C [address] [place], [place] [address] PCP: [name], PA-C   Assessment & Plan: Visit Diagnoses:  1. Other spondylosis with radiculopathy, cervical region   2. Lumbar pain     Plan: Patient has progressive spondylosis C3-4 ,C4-5 with C4-5 right paracentral disc protrusion spurring.  Her symptoms have progressed with compression noted on MRI scan.  It bothered her normal activities.  Is not sure she is known to be able to continue her work unless something is done.  We discussed at C3-4, C4-5 anterior cervical discectomy and fusion with allograft plate.  She understands she will be out of work for 6 weeks, use a collar after the surgery.  Operative technique was discussed.  Risk surgery was discussed including dysphasia or dysphonia, possibility of pseudoarthrosis with need for posterior fusion if she had a symptomatic pseudoarthrosis.  Plan would be overnight stay.  Patient understands would like to schedule this in January after the holidays so she can be with her grandchildren.  Prednisone 10 mg #21 Dosepak prescribed.  Options were elicited and answered.  We reviewed her previous MRI scan from 2008 as well as 2018.  Copy of the current report given and reviewed with her .   Follow-Up Instructions: Return in about 6 weeks (around 01/27/2017).   Orders:  No orders of the defined types were placed in this encounter.  Meds ordered this encounter  Medications  . predniSONE (STERAPRED UNI-PAK 21 TAB) 10 MG (21) TBPK tablet    Sig: Take as directed    Dispense:  21 tablet    Refill:  0      Procedures: No procedures performed   Clinical Data: No additional findings.   Subjective: Chief Complaint  Patient presents with  . Neck - Pain, Follow-up    MRI review    HPI 66 year old female returns  with ongoing problems with neck pain worse than back pain.  She has times when her neck pain is rated as severe.  She has pain with rotation of her neck pain that radiates into her shoulders worse on the right than left.  She has pain when she rotates of the right he states at times she is not able to do her normal activities due to the pain.  She is used anti-inflammatories she has been through physical therapy used heat and ice with persistent symptoms.  Symptoms of gradually over the last 2 years progressed point where at times her pain is severe.  She also has some low back pain chronically without radiculopathy and mild L2-3 disc space narrowing.  Myelopathic symptoms no bowel or bladder symptoms.  Negative for history of falls.  Review of Systems positive for chronic neck pain right shoulder pain and right arm pain.  Previous partial hysterectomy, bladder tack up procedure, foot surgery.  3 of reflux, hypertension and thyroid condition.  Osteopenia with vitamin D supplementation.  Positive for low back pain.   Objective: Vital Signs: BP (!) 141/89   Pulse 73   Physical Exam  Constitutional: She is oriented to person, place, and time. She appears well-developed.  HENT:  Head: Normocephalic.  Right Ear: External ear normal.  Left Ear: External ear normal.  Eyes: Pupils are equal, round, and reactive to light.  Neck: No tracheal deviation present. No thyromegaly present.  Cardiovascular: Normal rate.  Pulmonary/Chest: Effort normal.  Abdominal: Soft.  Neurological: She is alert and oriented to person, place, and time.  Skin: Skin is warm and dry.  Psychiatric: She has a normal mood and affect. Her behavior is normal.    Ortho Exam increased pain with cervical compression.  Right side brachial plexus tenderness positive Spurling on the right.  Biceps triceps brachioradialis reflex are 2+ and symmetrical.  Positive Spurling on the right negative on the left.  Negative for me.  Extension 50%  normal with neck and right shoulder pain.  Negative impingement right shoulder.  Long head of the biceps is normal.  Negative drop arm test.  No atrophy of the upper extremities.  No biceps triceps wrist flexion extension or interossei weakness right or left.  Specialty Comments:  No specialty comments available.  Imaging: Cervical MRI scan read by Dr. Pollyann Kennedy and compared to 2008 MRI scan shows progressive spondylosis with central and right protrusion contacting the cord.  She has progressive C4-5 spondylosis with right side ventral thecal sac effacement and shallow broad-based disc paracentral protrusion.  No change in tiny central protrusion at C5-6.  All other levels are normal.   PMFS History: Patient Active Problem List   Diagnosis Date Noted  . Other spondylosis with radiculopathy, cervical region 12/02/2016  . Chronic midline thoracic back pain 09/21/2016  . Lumbar pain 09/21/2016  . Gall bladder disease 06/10/2011  . Cholelithiasis 06/03/2011   Past Medical History:  Diagnosis Date  . Anxiety   . Cholelithiasis   . GERD (gastroesophageal reflux disease)   . Hyperlipidemia   . Hypertension   . Hypothyroidism   . Irritable bowel syndrome   . Thyroid disease   . Vitamin D deficiency     Family History  Problem Relation Age of Onset  . Cancer Mother        stomach  . Heart disease Father   . Cancer Maternal Aunt        ovarian  . Cancer Paternal Aunt        breast    Past Surgical History:  Procedure Laterality Date  . ABDOMINAL HYSTERECTOMY  [id number]  . APPENDECTOMY  1996  . BREAST MASS EXCISION  1997   left  . CHOLECYSTECTOMY  07/15/2011   Procedure: LAPAROSCOPIC CHOLECYSTECTOMY WITH INTRAOPERATIVE CHOLANGIOGRAM;  Surgeon: Shann Medal, MD;  Location: [facility];  Service: General;  Laterality: N/A;  Cholecystectomy with IOC  . TUBAL LIGATION  1980   Social History   Occupational History  . Not on file  Tobacco Use  . Smoking status: Current Every Day  Smoker    Packs/day: 1.00    Years: 36.00    Pack years: 36.00    Types: Cigarettes    Start date: 09/30/1973    Last attempt to quit: 01/18/2009    Years since quitting: 7.9  . Smokeless tobacco: Never Used  . Tobacco comment: pt started smoking 5-6 cig a day 08/2016  Substance and Sexual Activity  . Alcohol use: No    Alcohol/week: 0.0 oz  . Drug use: No  . Sexual activity: Not on file

## 2016-12-17 ENCOUNTER — Telehealth (INDEPENDENT_AMBULATORY_CARE_PROVIDER_SITE_OTHER): Payer: Self-pay | Admitting: Orthopaedic Surgery

## 2016-12-17 NOTE — Telephone Encounter (Signed)
Patient called asked if she can receive a call back with instructions to take the Prednisone Rx. Patient advised instructions was not on the bottle. The number to contact patient is [phone].

## 2016-12-17 NOTE — Telephone Encounter (Signed)
I called patient and advised. 

## 2016-12-29 ENCOUNTER — Ambulatory Visit: Payer: BLUE CROSS/BLUE SHIELD | Admitting: Physician Assistant

## 2016-12-29 ENCOUNTER — Encounter: Payer: Self-pay | Admitting: Physician Assistant

## 2016-12-29 VITALS — BP 124/84 | HR 101 | Temp 97.4°F | Ht 62.0 in | Wt 134.8 lb

## 2016-12-29 DIAGNOSIS — M4722 Other spondylosis with radiculopathy, cervical region: Secondary | ICD-10-CM

## 2016-12-29 DIAGNOSIS — K6289 Other specified diseases of anus and rectum: Secondary | ICD-10-CM | POA: Diagnosis not present

## 2016-12-29 DIAGNOSIS — F339 Major depressive disorder, recurrent, unspecified: Secondary | ICD-10-CM | POA: Insufficient documentation

## 2016-12-29 MED ORDER — SERTRALINE HCL 50 MG PO TABS: 50.0000 mg | ORAL_TABLET | Freq: Every day | ORAL | 0 refills | Status: DC

## 2016-12-29 NOTE — Patient Instructions (Addendum)
TAKE 1/2 tab of Zoloft 50 mg for 6 days, then go up to 50 mg daily.   Major Depressive Disorder, Adult Major depressive disorder (MDD) is a mental health condition. MDD often makes you feel sad, hopeless, or helpless. MDD can also cause symptoms in your body. MDD can affect your:  Work.  School.  Relationships.  Other normal activities.  MDD can range from mild to very bad. It may occur once (single episode MDD). It can also occur many times (recurrent MDD). The main symptoms of MDD often include:  Feeling sad, depressed, or irritable most of the time.  Loss of interest.  MDD symptoms also include:  Sleeping too much or too little.  Eating too much or too little.  A change in your weight.  Feeling tired (fatigue) or having low energy.  Feeling worthless.  Feeling guilty.  Trouble making decisions.  Trouble thinking clearly.  Thoughts of suicide or harming others.  Feeling weak.  Feeling agitated.  Keeping yourself from being around other people (isolation).  Follow these instructions at home: Activity  Do these things as told by your doctor: ? Go back to your normal activities. ? Exercise regularly. ? Spend time outdoors. Alcohol  Talk with your doctor about how alcohol can affect your antidepressant medicines.  Do not drink alcohol. Or, limit how much alcohol you drink. ? This means no more than 1 drink a day for nonpregnant women and 2 drinks a day for men. One drink equals one of these:  12 oz of beer.  5 oz of wine.  1 oz of hard liquor. General instructions  Take over-the-counter and prescription medicines only as told by your doctor.  Eat a healthy diet.  Get plenty of sleep.  Find activities that you enjoy. Make time to do them.  Think about joining a support group. Your doctor may be able to suggest a group for you.  Keep all follow-up visits as told by your doctor. This is important. Where to find more information:  [employer] on Mental Illness: ? www.nami.[place]: ? https://carter.com/  National Suicide Prevention Lifeline: ? 548-394-8062. This is free, 24-hour help. Contact a doctor if:  Your symptoms get worse.  You have new symptoms. Get help right away if:  You self-harm.  You see, hear, taste, smell, or feel things that are not present (hallucinate). If you ever feel like you may hurt yourself or others, or have thoughts about taking your own life, get help right away. You can go to your nearest emergency department or call:  Your local emergency services (911 in the [place]  A suicide crisis helpline, such as the National Suicide Prevention Lifeline: ? 339-182-3327. This is open 24 hours a day.  This information is not intended to replace advice given to you by your health care provider. Make sure you discuss any questions you have with your health care provider. Document Released: 12/16/2014 Document Revised: 09/21/2015 Document Reviewed: 09/21/2015 Elsevier Interactive Patient Education  2017 [employer].

## 2016-12-29 NOTE — Progress Notes (Signed)
BP 124/84   Pulse (!) 101   Temp (!) 97.4 F (36.3 C) (Oral)   Ht 5\' 2"  (1.575 m)   Wt 134 lb 12.8 oz (61.1 kg)   BMI 24.66 kg/m    Subjective:    Patient ID: Jaclyn Schmidt, female    DOB: 1950/09/24, 66 y.o.   MRN: [id number]  HPI: Jaclyn Schmidt is a 66 y.o. female presenting on 12/29/2016 for Depression  This patient comes in today for symptoms of depression.  Many years ago she had problems with this.  There is a significant amount of issues going on in her life.  She had to have neck surgery and is anticipating lumbar surgery.  She also had a relationship break after 5 years.  She has a grandson with some issues going on.  Work is still quite stressful at times.  She is quite tearful when talking about all of these things.  She is wanting to start back on an antidepressant at this time. Depression screen [facility] 2/9 12/29/2016 09/21/2016 01/14/2016 11/28/2015  Decreased Interest 3 0 1 1  Down, Depressed, Hopeless 2 0 1 1  PHQ - 2 Score 5 0 2 2  Altered sleeping 2 - 0 1  Tired, decreased energy 2 - 1 1  Change in appetite 3 - 1 1  Feeling bad or failure about yourself  1 - 0 0  Trouble concentrating 1 - 1 1  Moving slowly or fidgety/restless 1 - 0 1  Suicidal thoughts 0 - 0 0  PHQ-9 Score 15 - 5 7     Relevant past medical, surgical, family and social history reviewed and updated as indicated. Allergies and medications reviewed and updated.  Past Medical History:  Diagnosis Date  . Anxiety   . Cholelithiasis   . GERD (gastroesophageal reflux disease)   . Hyperlipidemia   . Hypertension   . Hypothyroidism   . Irritable bowel syndrome   . Thyroid disease   . Vitamin D deficiency     Past Surgical History:  Procedure Laterality Date  . ABDOMINAL HYSTERECTOMY  [id number]  . APPENDECTOMY  1996  . BREAST MASS EXCISION  1997   left  . CHOLECYSTECTOMY  07/15/2011   Procedure: LAPAROSCOPIC CHOLECYSTECTOMY WITH INTRAOPERATIVE CHOLANGIOGRAM;  Surgeon: Shann Medal, MD;   Location: [facility];  Service: General;  Laterality: N/A;  Cholecystectomy with IOC  . TUBAL LIGATION  1980    Review of Systems  Constitutional: Negative.   HENT: Negative.   Eyes: Negative.   Respiratory: Negative.   Gastrointestinal: Negative.   Genitourinary: Negative.   Psychiatric/Behavioral: Positive for decreased concentration and dysphoric mood. Negative for sleep disturbance and suicidal ideas. The patient is nervous/anxious.     Allergies as of 12/29/2016      Reactions   Penicillins Rash      Medication List        Accurate as of 12/29/16 10:49 AM. Always use your most recent med list.          ALPRAZolam 1 MG tablet Commonly known as:  XANAX Take 0.5 tablets (0.5 mg total) by mouth 3 (three) times daily.   BIOTIN MAXIMUM STRENGTH 10 MG Tabs Generic drug:  Biotin Take 1 tablet by mouth daily.   cholestyramine 4 GM/DOSE powder Commonly known as:  QUESTRAN Take 4 g by mouth 2 (two) times daily as needed.   cyclobenzaprine 10 MG tablet Commonly known as:  FLEXERIL Take 1 tablet (10 mg total) by mouth  3 (three) times daily as needed for muscle spasms.   fluticasone 50 MCG/ACT nasal spray Commonly known as:  FLONASE Place 2 sprays into both nostrils daily.   levothyroxine 88 MCG tablet Commonly known as:  SYNTHROID, LEVOTHROID Take 88 mcg by mouth. Take 3 days a week   levothyroxine 75 MCG tablet Commonly known as:  SYNTHROID, LEVOTHROID Take 75 mcg by mouth daily before breakfast. Take 4 days a week   meclizine 25 MG tablet Commonly known as:  ANTIVERT Take 1 tablet (25 mg total) by mouth 3 (three) times daily as needed for dizziness.   pravastatin 40 MG tablet Commonly known as:  PRAVACHOL Take 1 tablet (40 mg total) by mouth daily.   sertraline 50 MG tablet Commonly known as:  ZOLOFT Take 1 tablet (50 mg total) by mouth daily.   VITAMIN D PO Take 2,000 Units by mouth daily.          Objective:    BP 124/84   Pulse (!) 101   Temp (!)  97.4 F (36.3 C) (Oral)   Ht 5\' 2"  (1.575 m)   Wt 134 lb 12.8 oz (61.1 kg)   BMI 24.66 kg/m   Allergies  Allergen Reactions  . Penicillins Rash    Physical Exam  Constitutional: She is oriented to person, place, and time. She appears well-developed and well-nourished.  HENT:  Head: Normocephalic and atraumatic.  Right Ear: Tympanic membrane, external ear and ear canal normal.  Left Ear: Tympanic membrane, external ear and ear canal normal.  Nose: Nose normal. No rhinorrhea.  Mouth/Throat: Oropharynx is clear and moist and mucous membranes are normal. No oropharyngeal exudate or posterior oropharyngeal erythema.  Eyes: Conjunctivae and EOM are normal. Pupils are equal, round, and reactive to light.  Neck: Normal range of motion. Neck supple.  Cardiovascular: Normal rate, regular rhythm, normal heart sounds and intact distal pulses.  Pulmonary/Chest: Effort normal and breath sounds normal.  Abdominal: Soft. Bowel sounds are normal.  Neurological: She is alert and oriented to person, place, and time. She has normal reflexes.  Skin: Skin is warm and dry. No rash noted.  Psychiatric: She has a normal mood and affect. Her behavior is normal. Judgment and thought content normal.  Nursing note and vitals reviewed.       Assessment & Plan:   1. Depression, recurrent ([place]) - sertraline (ZOLOFT) 50 MG tablet; Take 1 tablet (50 mg total) by mouth daily.  Dispense: 90 tablet; Refill: 0  2. Anal sphincter incompetence  3. Other spondylosis with radiculopathy, cervical region     Current Outpatient Medications:  .  ALPRAZolam (XANAX) 1 MG tablet, Take 0.5 tablets (0.5 mg total) by mouth 3 (three) times daily., Disp: 120 tablet, Rfl: 1 .  Biotin (BIOTIN MAXIMUM STRENGTH) 10 MG TABS, Take 1 tablet by mouth daily., Disp: , Rfl:  .  Cholecalciferol (VITAMIN D PO), Take 2,000 Units by mouth daily. , Disp: , Rfl:  .  cholestyramine (QUESTRAN) 4 GM/DOSE powder, Take 4 g by mouth 2 (two)  times daily as needed., Disp: , Rfl:  .  cyclobenzaprine (FLEXERIL) 10 MG tablet, Take 1 tablet (10 mg total) by mouth 3 (three) times daily as needed for muscle spasms., Disp: 90 tablet, Rfl: 2 .  fluticasone (FLONASE) 50 MCG/ACT nasal spray, Place 2 sprays into both nostrils daily. , Disp: , Rfl: 2 .  levothyroxine (SYNTHROID, LEVOTHROID) 75 MCG tablet, Take 75 mcg by mouth daily before breakfast. Take 4 days a week, Disp: ,  Rfl:  .  levothyroxine (SYNTHROID, LEVOTHROID) 88 MCG tablet, Take 88 mcg by mouth. Take 3 days a week, Disp: , Rfl:  .  meclizine (ANTIVERT) 25 MG tablet, Take 1 tablet (25 mg total) by mouth 3 (three) times daily as needed for dizziness., Disp: 90 tablet, Rfl: 1 .  pravastatin (PRAVACHOL) 40 MG tablet, Take 1 tablet (40 mg total) by mouth daily., Disp: 90 tablet, Rfl: 3 .  sertraline (ZOLOFT) 50 MG tablet, Take 1 tablet (50 mg total) by mouth daily., Disp: 90 tablet, Rfl: 0 Continue all other maintenance medications as listed above.  Follow up plan: Return in about 4 weeks (around 01/26/2017) for recheck.  Educational handout given for depression [name] PA-C [place] [address]  [place], [place] [address] [phone]   12/29/2016, 10:49 AM

## 2017-01-05 ENCOUNTER — Telehealth (INDEPENDENT_AMBULATORY_CARE_PROVIDER_SITE_OTHER): Payer: Self-pay | Admitting: Orthopaedic Surgery

## 2017-01-05 NOTE — Telephone Encounter (Signed)
Needs a work note stating that she is having surgery and will be out of work and how long. She would like it faxed if possible.  Fax: [phone]

## 2017-01-07 NOTE — Telephone Encounter (Signed)
Note entered and faxed. 

## 2017-01-10 NOTE — Pre-Procedure Instructions (Signed)
Kella Splinter Wirtz  01/10/2017      [facility] [address] [place] [address] [place] [address] Phone: [phone] Fax: [phone]    Your procedure is scheduled on Friday, January 21, 2017  Report [facility] Admitting Entrance "A" at 5:30AM  Call this number if you have problems the morning of surgery:  [phone]   Remember:  Do not eat food or drink liquids after midnight.  Take these medicines the morning of surgery with A SIP OF WATER: Levothyroxine (SYNTHROID, LEVOTHROID) and Sertraline (ZOLOFT). If needed Meclizine (ANTIVERT) for dizziness, ALPRAZolam Duanne Moron) for anxiety, Cyclobenzaprine (FLEXERIL) for spasms, and Fluticasone (FLONASE) for nasal congestion.  7 days before surgery (Dec. 28), stop taking all Aspirins, Vitamins, Fish oils, and Herbal medications. Also stop all NSAIDS i.e. Advil, Ibuprofen, Motrin, Aleve, Anaprox, Naproxen, BC and Goody Powders.   Do not wear jewelry, make-up or nail polish.  Do not wear lotions, powders, perfumes, or deodorant.  Do not shave 48 hours prior to surgery.    Do not bring valuables to the hospital.  [facility] is not responsible for any belongings or valuables.  Contacts, dentures or bridgework may not be worn into surgery.  Leave your suitcase in the car.  After surgery it may be brought to your room.  For patients admitted to the hospital, discharge time will be determined by your treatment team.  Patients discharged the day of surgery will not be allowed to drive home.   Special instructions:   [facility]- Preparing For Surgery  Before surgery, you can play an important role. Because skin is not sterile, your skin needs to be as free of germs as possible. You can reduce the number of germs on your skin by washing with CHG (chlorahexidine gluconate) Soap before surgery.  CHG is an antiseptic cleaner which kills germs and bonds with the skin to continue killing germs even  after washing.  Please do not use if you have an allergy to CHG or antibacterial soaps. If your skin becomes reddened/irritated stop using the CHG.  Do not shave (including legs and underarms) for at least 48 hours prior to first CHG shower. It is OK to shave your face.  Please follow these instructions carefully.   1. Shower the NIGHT BEFORE SURGERY and the MORNING OF SURGERY with CHG.   2. If you chose to wash your hair, wash your hair first as usual with your normal shampoo.  3. After you shampoo, rinse your hair and body thoroughly to remove the shampoo.  4. Use CHG as you would any other liquid soap. You can apply CHG directly to the skin and wash gently with a scrungie or a clean washcloth.   5. Apply the CHG Soap to your body ONLY FROM THE NECK DOWN.  Do not use on open wounds or open sores. Avoid contact with your eyes, ears, mouth and genitals (private parts). Wash Face and genitals (private parts)  with your normal soap.  6. Wash thoroughly, paying special attention to the area where your surgery will be performed.  7. Thoroughly rinse your body with warm water from the neck down.  8. DO NOT shower/wash with your normal soap after using and rinsing off the CHG Soap.  9. Pat yourself dry with a CLEAN TOWEL.  10. Wear CLEAN PAJAMAS to bed the night before surgery, wear comfortable clothes the morning of surgery  11. Place CLEAN SHEETS on  your bed the night of your first shower and DO NOT SLEEP WITH PETS.  Day of Surgery: Do not apply any deodorants/lotions. Please wear clean clothes to the hospital/surgery center.    Please read over the following fact sheets that you were given. Pain Booklet, Coughing and Deep Breathing, MRSA Information and Surgical Site Infection Prevention

## 2017-01-12 ENCOUNTER — Encounter (HOSPITAL_COMMUNITY): Payer: Self-pay

## 2017-01-12 ENCOUNTER — Encounter (HOSPITAL_COMMUNITY)
Admission: RE | Admit: 2017-01-12 | Discharge: 2017-01-12 | Disposition: A | Discharge status: Home / Self Care | Payer: BLUE CROSS/BLUE SHIELD | Source: Ambulatory Visit | Attending: Orthopaedic Surgery | Admitting: Orthopaedic Surgery

## 2017-01-12 ENCOUNTER — Other Ambulatory Visit: Payer: Self-pay

## 2017-01-12 ENCOUNTER — Telehealth (INDEPENDENT_AMBULATORY_CARE_PROVIDER_SITE_OTHER): Payer: Self-pay | Admitting: Orthopaedic Surgery

## 2017-01-12 DIAGNOSIS — Z9889 Other specified postprocedural states: Secondary | ICD-10-CM | POA: Diagnosis not present

## 2017-01-12 DIAGNOSIS — Z01812 Encounter for preprocedural laboratory examination: Secondary | ICD-10-CM | POA: Insufficient documentation

## 2017-01-12 DIAGNOSIS — Z880 Allergy status to penicillin: Secondary | ICD-10-CM | POA: Diagnosis not present

## 2017-01-12 DIAGNOSIS — K589 Irritable bowel syndrome without diarrhea: Secondary | ICD-10-CM | POA: Diagnosis not present

## 2017-01-12 DIAGNOSIS — E039 Hypothyroidism, unspecified: Secondary | ICD-10-CM | POA: Diagnosis not present

## 2017-01-12 DIAGNOSIS — Z9049 Acquired absence of other specified parts of digestive tract: Secondary | ICD-10-CM | POA: Diagnosis not present

## 2017-01-12 DIAGNOSIS — M4722 Other spondylosis with radiculopathy, cervical region: Secondary | ICD-10-CM | POA: Diagnosis not present

## 2017-01-12 DIAGNOSIS — F419 Anxiety disorder, unspecified: Secondary | ICD-10-CM | POA: Insufficient documentation

## 2017-01-12 DIAGNOSIS — Z79899 Other long term (current) drug therapy: Secondary | ICD-10-CM | POA: Insufficient documentation

## 2017-01-12 DIAGNOSIS — I10 Essential (primary) hypertension: Secondary | ICD-10-CM | POA: Insufficient documentation

## 2017-01-12 DIAGNOSIS — K219 Gastro-esophageal reflux disease without esophagitis: Secondary | ICD-10-CM | POA: Diagnosis not present

## 2017-01-12 DIAGNOSIS — K6281 Anal sphincter tear (healed) (nontraumatic) (old): Secondary | ICD-10-CM | POA: Diagnosis not present

## 2017-01-12 DIAGNOSIS — Z01810 Encounter for preprocedural cardiovascular examination: Secondary | ICD-10-CM | POA: Insufficient documentation

## 2017-01-12 DIAGNOSIS — E785 Hyperlipidemia, unspecified: Secondary | ICD-10-CM | POA: Insufficient documentation

## 2017-01-12 DIAGNOSIS — Z90710 Acquired absence of both cervix and uterus: Secondary | ICD-10-CM | POA: Insufficient documentation

## 2017-01-12 DIAGNOSIS — F339 Major depressive disorder, recurrent, unspecified: Secondary | ICD-10-CM | POA: Diagnosis not present

## 2017-01-12 HISTORY — DX: Pneumonia, unspecified organism: J18.9

## 2017-01-12 HISTORY — DX: Personal history of urinary calculi: Z87.442

## 2017-01-12 LAB — BASIC METABOLIC PANEL
Anion gap: 11 (ref 5–15)
BUN: 12 mg/dL (ref 6–20)
CO2: 25 mmol/L (ref 22–32)
Calcium: 8.9 mg/dL (ref 8.9–10.3)
Chloride: 104 mmol/L (ref 101–111)
Creatinine, Ser: 0.69 mg/dL (ref 0.44–1.00)
GFR calc Af Amer: >60 mL/min (ref >60)
GFR calc non Af Amer: >60 mL/min (ref >60)
Glucose, Bld: 89 mg/dL (ref 65–99)
Potassium: 3.7 mmol/L (ref 3.5–5.1)
Sodium: 140 mmol/L (ref 135–145)

## 2017-01-12 LAB — CBC
HCT: 39.2 % (ref 36.0–46.0)
Hemoglobin: 13.1 g/dL (ref 12.0–15.0)
MCH: 29.8 pg (ref 26.0–34.0)
MCHC: 33.4 g/dL (ref 30.0–36.0)
MCV: 89.1 fL (ref 78.0–100.0)
Platelets: 257 10*3/uL (ref 150–400)
RBC: 4.4 MIL/uL (ref 3.87–5.11)
RDW: 13.1 % (ref 11.5–15.5)
WBC: 4.2 10*3/uL (ref 4.0–10.5)

## 2017-01-12 LAB — SURGICAL PCR SCREEN
MRSA, PCR: NEGATIVE
Staphylococcus aureus: NEGATIVE

## 2017-01-12 NOTE — Telephone Encounter (Signed)
Patient calling about her Neck surgery with Dr Lorin Mercy scheduled for Jan 4th.  She states that she has BCBS and Medicare A, but has never used the medicare.  Patient is 27 and works full time for [employer].  She has questions about how the insurance is filed when it is INPATIENT and OUTPATIENT.  I told her that she is 23 hours OBS. She is also mentioning the cost of $2200 if it is listed as outpatient. When she is OBS, what is this considered out or in? Will she still incur this cost if it is OBS?   I spoke with Malachy Mood about this and she said I needed to to check with you.  Can you either let me know something or can you call the patient?   Pt's cb  [phone]

## 2017-01-12 NOTE — Progress Notes (Signed)
PCP - Dr. Particia Nearing- [facility]  Cardiologist - Denies  Chest x-ray - Denies  EKG - 01/12/17  Stress Test - Denies  ECHO - 12/18/14 (E)  Cardiac Cath - Denies  Sleep Study - No CPAP - None  LABS- 01/12/17: CBC & BMP   Anesthesia- No  Pt denies having chest pain, sob, or fever at this time. All instructions explained to the pt, with a verbal understanding of the material. Pt agrees to go over the instructions while at home for a better understanding. The opportunity to ask questions was provided.

## 2017-01-13 NOTE — Telephone Encounter (Signed)
Patient calling about her Neck surgery with Dr Lorin Mercy scheduled for Jan 4th.  She states that she has BCBS and Medicare A, but has never used the medicare.  Patient is 29 and works full time for [employer].  She has questions about how the insurance is filed when it is INPATIENT and OUTPATIENT.  I told her that she is 23 hours OBS. She is also mentioning the cost of $2200 if it is listed as outpatient. When she is OBS, what is this considered out or in? Will she still incur this cost if it is OBS?   I spoke with Malachy Mood about this and she said I needed to to check with you.  Can you either let me know something or can you call the patient?   Pt's cb  [phone]

## 2017-01-20 MED ORDER — VANCOMYCIN HCL IN DEXTROSE 1-5 GM/200ML-% IV SOLN
1000.0000 mg | INTRAVENOUS | Status: AC
  Administered 2017-01-21: 1000 mg via INTRAVENOUS
  Filled 2017-01-20: qty 200

## 2017-01-20 NOTE — H&P (Signed)
Jaclyn Schmidt is an 67 y.o. female.   Chief Complaint:  Neck pain and upper extremity radiculopathy HPI: patient with hx of C3-4 and C4-5 HNP and above complaint presents for surgical intervention.  Progressively worsening symptoms.  Failed conservative treatment.   Past Medical History:  Diagnosis Date  . Anxiety   . Cholelithiasis   . GERD (gastroesophageal reflux disease)   . History of kidney stones   . Hyperlipidemia   . Hypertension   . Hypothyroidism   . Irritable bowel syndrome   . Pneumonia   . Thyroid disease   . Vitamin D deficiency     Past Surgical History:  Procedure Laterality Date  . ABDOMINAL HYSTERECTOMY  [id number]  . APPENDECTOMY  1996  . BREAST MASS EXCISION  1997   left  . CHOLECYSTECTOMY  07/15/2011   Procedure: LAPAROSCOPIC CHOLECYSTECTOMY WITH INTRAOPERATIVE CHOLANGIOGRAM;  Surgeon: Shann Medal, MD;  Location: [facility];  Service: General;  Laterality: N/A;  Cholecystectomy with IOC  . TOE FUSION    . TUBAL LIGATION  1980    Family History  Problem Relation Age of Onset  . Cancer Mother        stomach  . Heart disease Father   . Cancer Maternal Aunt        ovarian  . Cancer Paternal Aunt        breast   Social History:  reports that she has been smoking cigarettes.  She started smoking about 43 years ago. She has a 9.00 pack-year smoking history. she has never used smokeless tobacco. She reports that she does not drink alcohol or use drugs.  Allergies:  Allergies  Allergen Reactions  . Penicillins Rash and Other (See Comments)    PATIENT HAS HAD A PCN REACTION WITH IMMEDIATE RASH, FACIAL/TONGUE/THROAT SWELLING, SOB, OR LIGHTHEADEDNESS WITH HYPOTENSION:  #  #  #  YES  #  #  #   Has patient had a PCN reaction causing severe rash involving mucus membranes or skin necrosis: No Has patient had a PCN reaction that required hospitalization: No Has patient had a PCN reaction occurring within the last 10 years: No If all of the above answers are "NO",  then may proceed with Cephalosporin use.     No medications prior to admission.    No results found for this or any previous visit (from the past 48 hour(s)). No results found.  Review of Systems  Constitutional: Negative.   HENT: Negative.   Respiratory: Negative.   Cardiovascular: Negative.   Genitourinary: Negative.   Musculoskeletal: Positive for neck pain.  Skin: Negative.   Neurological: Positive for tingling.  Psychiatric/Behavioral: Negative.     There were no vitals taken for this visit. Physical Exam   Assessment/Plan C3-4 and C4-5 HNP, neck pain and radiculopathy   We will proceed with C3-4, C4-5 ANTERIOR CERVICAL DISCECTOMY AND FUSION, ALLOGRAFT, PLATE as scheduled.surgical procedure discussed along with possible risks and complications.  All questions answered.   Benjiman Core, PA-C 01/20/2017, 4:38 PM

## 2017-01-21 ENCOUNTER — Observation Stay (HOSPITAL_COMMUNITY)
Admission: RE | Admit: 2017-01-21 | Discharge: 2017-01-22 | Disposition: A | Discharge status: Home / Self Care | Payer: BLUE CROSS/BLUE SHIELD | Source: Ambulatory Visit | Attending: Orthopaedic Surgery | Admitting: Orthopaedic Surgery

## 2017-01-21 ENCOUNTER — Ambulatory Visit (HOSPITAL_COMMUNITY): Payer: BLUE CROSS/BLUE SHIELD | Admitting: Anesthesiology

## 2017-01-21 ENCOUNTER — Ambulatory Visit (HOSPITAL_COMMUNITY): Payer: BLUE CROSS/BLUE SHIELD

## 2017-01-21 ENCOUNTER — Encounter: Payer: BLUE CROSS/BLUE SHIELD | Admitting: Physician Assistant

## 2017-01-21 ENCOUNTER — Encounter (HOSPITAL_COMMUNITY): Payer: Self-pay | Admitting: Anesthesiology

## 2017-01-21 ENCOUNTER — Encounter (HOSPITAL_COMMUNITY)
Admission: RE | Disposition: A | Discharge status: Home / Self Care | Payer: Self-pay | Source: Ambulatory Visit | Attending: Orthopaedic Surgery

## 2017-01-21 DIAGNOSIS — M5011 Cervical disc disorder with radiculopathy,  high cervical region: Principal | ICD-10-CM | POA: Insufficient documentation

## 2017-01-21 DIAGNOSIS — Z880 Allergy status to penicillin: Secondary | ICD-10-CM | POA: Diagnosis not present

## 2017-01-21 DIAGNOSIS — F17210 Nicotine dependence, cigarettes, uncomplicated: Secondary | ICD-10-CM | POA: Insufficient documentation

## 2017-01-21 DIAGNOSIS — F329 Major depressive disorder, single episode, unspecified: Secondary | ICD-10-CM | POA: Diagnosis not present

## 2017-01-21 DIAGNOSIS — E785 Hyperlipidemia, unspecified: Secondary | ICD-10-CM | POA: Insufficient documentation

## 2017-01-21 DIAGNOSIS — E039 Hypothyroidism, unspecified: Secondary | ICD-10-CM | POA: Insufficient documentation

## 2017-01-21 DIAGNOSIS — E559 Vitamin D deficiency, unspecified: Secondary | ICD-10-CM | POA: Diagnosis not present

## 2017-01-21 DIAGNOSIS — Z79899 Other long term (current) drug therapy: Secondary | ICD-10-CM | POA: Insufficient documentation

## 2017-01-21 DIAGNOSIS — M47812 Spondylosis without myelopathy or radiculopathy, cervical region: Secondary | ICD-10-CM | POA: Diagnosis not present

## 2017-01-21 DIAGNOSIS — I10 Essential (primary) hypertension: Secondary | ICD-10-CM | POA: Diagnosis not present

## 2017-01-21 DIAGNOSIS — F419 Anxiety disorder, unspecified: Secondary | ICD-10-CM | POA: Diagnosis not present

## 2017-01-21 DIAGNOSIS — M4802 Spinal stenosis, cervical region: Secondary | ICD-10-CM | POA: Diagnosis present

## 2017-01-21 DIAGNOSIS — Z8249 Family history of ischemic heart disease and other diseases of the circulatory system: Secondary | ICD-10-CM | POA: Diagnosis not present

## 2017-01-21 DIAGNOSIS — Z419 Encounter for procedure for purposes other than remedying health state, unspecified: Secondary | ICD-10-CM

## 2017-01-21 DIAGNOSIS — Z01818 Encounter for other preprocedural examination: Secondary | ICD-10-CM

## 2017-01-21 DIAGNOSIS — K589 Irritable bowel syndrome without diarrhea: Secondary | ICD-10-CM | POA: Diagnosis not present

## 2017-01-21 DIAGNOSIS — K219 Gastro-esophageal reflux disease without esophagitis: Secondary | ICD-10-CM | POA: Diagnosis not present

## 2017-01-21 DIAGNOSIS — M5020 Other cervical disc displacement, unspecified cervical region: Secondary | ICD-10-CM | POA: Diagnosis not present

## 2017-01-21 DIAGNOSIS — M4722 Other spondylosis with radiculopathy, cervical region: Secondary | ICD-10-CM | POA: Diagnosis not present

## 2017-01-21 HISTORY — PX: ANTERIOR CERVICAL DECOMP/DISCECTOMY FUSION: SHX1161

## 2017-01-21 LAB — HEPATIC FUNCTION PANEL
ALT: 16 U/L (ref 14–54)
AST: 17 U/L (ref 15–41)
Albumin: 3.7 g/dL (ref 3.5–5.0)
Alkaline Phosphatase: 76 U/L (ref 38–126)
Bilirubin, Direct: <0.1 mg/dL — ABNORMAL LOW (ref 0.1–0.5)
Total Bilirubin: 0.8 mg/dL (ref 0.3–1.2)
Total Protein: 6 g/dL — ABNORMAL LOW (ref 6.5–8.1)

## 2017-01-21 SURGERY — ANTERIOR CERVICAL DECOMPRESSION/DISCECTOMY FUSION 2 LEVELS
Anesthesia: General

## 2017-01-21 MED ORDER — MEPERIDINE HCL 25 MG/ML IJ SOLN: 6.2500 mg | INTRAMUSCULAR | Status: DC | PRN

## 2017-01-21 MED ORDER — ONDANSETRON HCL 4 MG/2ML IJ SOLN
INTRAMUSCULAR | Status: AC
  Filled 2017-01-21: qty 2

## 2017-01-21 MED ORDER — ACETAMINOPHEN 650 MG RE SUPP: 650.0000 mg | RECTAL | Status: DC | PRN

## 2017-01-21 MED ORDER — MIDAZOLAM HCL 2 MG/2ML IJ SOLN
INTRAMUSCULAR | Status: AC
  Filled 2017-01-21: qty 2

## 2017-01-21 MED ORDER — LIDOCAINE 2% (20 MG/ML) 5 ML SYRINGE
INTRAMUSCULAR | Status: AC
  Filled 2017-01-21: qty 5

## 2017-01-21 MED ORDER — DEXAMETHASONE SODIUM PHOSPHATE 10 MG/ML IJ SOLN
INTRAMUSCULAR | Status: AC
  Filled 2017-01-21: qty 1

## 2017-01-21 MED ORDER — CHOLESTYRAMINE 4 G PO PACK: 4.0000 g | PACK | Freq: Two times a day (BID) | ORAL | Status: DC | PRN

## 2017-01-21 MED ORDER — BUPIVACAINE HCL (PF) 0.25 % IJ SOLN
INTRAMUSCULAR | Status: AC
  Filled 2017-01-21: qty 30

## 2017-01-21 MED ORDER — OXYCODONE HCL 5 MG PO TABS
5.0000 mg | ORAL_TABLET | ORAL | Status: DC | PRN
  Administered 2017-01-21 – 2017-01-22 (×6): 5 mg via ORAL
  Filled 2017-01-21 (×5): qty 1

## 2017-01-21 MED ORDER — SUGAMMADEX SODIUM 500 MG/5ML IV SOLN
INTRAVENOUS | Status: DC | PRN
  Administered 2017-01-21: 300 mg via INTRAVENOUS

## 2017-01-21 MED ORDER — EPHEDRINE 5 MG/ML INJ
INTRAVENOUS | Status: AC
  Filled 2017-01-21: qty 10

## 2017-01-21 MED ORDER — SODIUM CHLORIDE 0.9% FLUSH: 3.0000 mL | INTRAVENOUS | Status: DC | PRN

## 2017-01-21 MED ORDER — PHENYLEPHRINE HCL 10 MG/ML IJ SOLN
INTRAMUSCULAR | Status: DC | PRN
  Administered 2017-01-21 (×3): 40 ug via INTRAVENOUS

## 2017-01-21 MED ORDER — SUCCINYLCHOLINE CHLORIDE 20 MG/ML IJ SOLN
INTRAMUSCULAR | Status: DC | PRN
  Administered 2017-01-21: 120 mg via INTRAVENOUS

## 2017-01-21 MED ORDER — METHOCARBAMOL 1000 MG/10ML IJ SOLN
500.0000 mg | Freq: Four times a day (QID) | INTRAVENOUS | Status: DC | PRN
  Filled 2017-01-21: qty 5

## 2017-01-21 MED ORDER — PRAVASTATIN SODIUM 40 MG PO TABS
40.0000 mg | ORAL_TABLET | Freq: Every day | ORAL | Status: DC
  Administered 2017-01-21: 40 mg via ORAL
  Filled 2017-01-21: qty 1

## 2017-01-21 MED ORDER — LEVOTHYROXINE SODIUM 88 MCG PO TABS
88.0000 ug | ORAL_TABLET | ORAL | Status: DC
  Filled 2017-01-21: qty 1

## 2017-01-21 MED ORDER — SERTRALINE HCL 50 MG PO TABS: 50.0000 mg | ORAL_TABLET | Freq: Every day | ORAL | Status: DC

## 2017-01-21 MED ORDER — ROCURONIUM BROMIDE 10 MG/ML (PF) SYRINGE
PREFILLED_SYRINGE | INTRAVENOUS | Status: AC
  Filled 2017-01-21: qty 5

## 2017-01-21 MED ORDER — SUGAMMADEX SODIUM 200 MG/2ML IV SOLN
INTRAVENOUS | Status: AC
  Filled 2017-01-21: qty 4

## 2017-01-21 MED ORDER — EPHEDRINE 5 MG/ML INJ
INTRAVENOUS | Status: AC
  Filled 2017-01-21: qty 20

## 2017-01-21 MED ORDER — ONDANSETRON HCL 4 MG/2ML IJ SOLN: 4.0000 mg | Freq: Four times a day (QID) | INTRAMUSCULAR | Status: DC | PRN

## 2017-01-21 MED ORDER — METHOCARBAMOL 500 MG PO TABS: 500.0000 mg | ORAL_TABLET | Freq: Four times a day (QID) | ORAL | 0 refills | Status: DC | PRN

## 2017-01-21 MED ORDER — MIDAZOLAM HCL 5 MG/5ML IJ SOLN
INTRAMUSCULAR | Status: DC | PRN
  Administered 2017-01-21: .25 mg via INTRAVENOUS

## 2017-01-21 MED ORDER — PROMETHAZINE HCL 25 MG/ML IJ SOLN: 6.2500 mg | INTRAMUSCULAR | Status: DC | PRN

## 2017-01-21 MED ORDER — 0.9 % SODIUM CHLORIDE (POUR BTL) OPTIME
TOPICAL | Status: DC | PRN
  Administered 2017-01-21: 1000 mL

## 2017-01-21 MED ORDER — PROPOFOL 10 MG/ML IV BOLUS
INTRAVENOUS | Status: DC | PRN
  Administered 2017-01-21: 140 mg via INTRAVENOUS

## 2017-01-21 MED ORDER — FENTANYL CITRATE (PF) 100 MCG/2ML IJ SOLN
INTRAMUSCULAR | Status: DC | PRN
  Administered 2017-01-21 (×2): 50 ug via INTRAVENOUS

## 2017-01-21 MED ORDER — ONDANSETRON HCL 4 MG PO TABS: 4.0000 mg | ORAL_TABLET | Freq: Four times a day (QID) | ORAL | Status: DC | PRN

## 2017-01-21 MED ORDER — OXYCODONE-ACETAMINOPHEN 5-325 MG PO TABS: 1.0000 | ORAL_TABLET | Freq: Four times a day (QID) | ORAL | 0 refills | Status: DC | PRN

## 2017-01-21 MED ORDER — VANCOMYCIN HCL IN DEXTROSE 1-5 GM/200ML-% IV SOLN
1000.0000 mg | Freq: Once | INTRAVENOUS | Status: AC
  Administered 2017-01-21: 1000 mg via INTRAVENOUS
  Filled 2017-01-21: qty 200

## 2017-01-21 MED ORDER — PHENYLEPHRINE 40 MCG/ML (10ML) SYRINGE FOR IV PUSH (FOR BLOOD PRESSURE SUPPORT)
PREFILLED_SYRINGE | INTRAVENOUS | Status: AC
  Filled 2017-01-21: qty 10

## 2017-01-21 MED ORDER — ROCURONIUM BROMIDE 100 MG/10ML IV SOLN
INTRAVENOUS | Status: DC | PRN
  Administered 2017-01-21: 5 mg via INTRAVENOUS
  Administered 2017-01-21: 10 mg via INTRAVENOUS
  Administered 2017-01-21: 5 mg via INTRAVENOUS
  Administered 2017-01-21: 30 mg via INTRAVENOUS
  Administered 2017-01-21: 5 mg via INTRAVENOUS
  Administered 2017-01-21: 10 mg via INTRAVENOUS
  Administered 2017-01-21: 5 mg via INTRAVENOUS

## 2017-01-21 MED ORDER — LACTATED RINGERS IV SOLN
INTRAVENOUS | Status: DC | PRN
  Administered 2017-01-21 (×2): via INTRAVENOUS

## 2017-01-21 MED ORDER — HYDROMORPHONE HCL 1 MG/ML IJ SOLN
INTRAMUSCULAR | Status: AC
  Administered 2017-01-21: 0.5 mg via INTRAVENOUS
  Filled 2017-01-21: qty 1

## 2017-01-21 MED ORDER — LACTATED RINGERS IV SOLN: INTRAVENOUS | Status: DC

## 2017-01-21 MED ORDER — DOCUSATE SODIUM 100 MG PO CAPS
100.0000 mg | ORAL_CAPSULE | Freq: Two times a day (BID) | ORAL | Status: DC
  Administered 2017-01-21: 100 mg via ORAL
  Filled 2017-01-21: qty 1

## 2017-01-21 MED ORDER — ACETAMINOPHEN 325 MG PO TABS
650.0000 mg | ORAL_TABLET | ORAL | Status: DC | PRN
  Administered 2017-01-21: 650 mg via ORAL
  Filled 2017-01-21: qty 2

## 2017-01-21 MED ORDER — DIPHENHYDRAMINE HCL 50 MG/ML IJ SOLN
INTRAMUSCULAR | Status: DC | PRN
  Administered 2017-01-21: 25 mg via INTRAVENOUS

## 2017-01-21 MED ORDER — ONDANSETRON HCL 4 MG/2ML IJ SOLN
INTRAMUSCULAR | Status: DC | PRN
  Administered 2017-01-21: 4 mg via INTRAVENOUS

## 2017-01-21 MED ORDER — CHLORHEXIDINE GLUCONATE 4 % EX LIQD: 60.0000 mL | Freq: Once | CUTANEOUS | Status: DC

## 2017-01-21 MED ORDER — METHOCARBAMOL 500 MG PO TABS
ORAL_TABLET | ORAL | Status: AC
  Administered 2017-01-21: 500 mg via ORAL
  Filled 2017-01-21: qty 1

## 2017-01-21 MED ORDER — FENTANYL CITRATE (PF) 250 MCG/5ML IJ SOLN
INTRAMUSCULAR | Status: AC
  Filled 2017-01-21: qty 5

## 2017-01-21 MED ORDER — BUPIVACAINE-EPINEPHRINE 0.25% -1:200000 IJ SOLN
INTRAMUSCULAR | Status: DC | PRN
  Administered 2017-01-21: 10 mL

## 2017-01-21 MED ORDER — PHENOL 1.4 % MT LIQD: 1.0000 | OROMUCOSAL | Status: DC | PRN

## 2017-01-21 MED ORDER — SODIUM CHLORIDE 0.9 % IV SOLN: INTRAVENOUS | Status: DC

## 2017-01-21 MED ORDER — EPHEDRINE SULFATE 50 MG/ML IJ SOLN
INTRAMUSCULAR | Status: DC | PRN
  Administered 2017-01-21 (×6): 5 mg via INTRAVENOUS

## 2017-01-21 MED ORDER — MENTHOL 3 MG MT LOZG
1.0000 | LOZENGE | OROMUCOSAL | Status: DC | PRN
  Filled 2017-01-21: qty 9

## 2017-01-21 MED ORDER — GLYCOPYRROLATE 1 MG PO TABS: 2.0000 mg | ORAL_TABLET | Freq: Every day | ORAL | Status: DC | PRN

## 2017-01-21 MED ORDER — LIDOCAINE HCL (CARDIAC) 20 MG/ML IV SOLN
INTRAVENOUS | Status: DC | PRN
  Administered 2017-01-21: 60 mg via INTRAVENOUS

## 2017-01-21 MED ORDER — PROPOFOL 10 MG/ML IV BOLUS
INTRAVENOUS | Status: AC
  Filled 2017-01-21: qty 20

## 2017-01-21 MED ORDER — FLUTICASONE PROPIONATE 50 MCG/ACT NA SUSP: 2.0000 | Freq: Every day | NASAL | Status: DC | PRN

## 2017-01-21 MED ORDER — OXYCODONE HCL 5 MG PO TABS
ORAL_TABLET | ORAL | Status: AC
  Administered 2017-01-21: 5 mg via ORAL
  Filled 2017-01-21: qty 1

## 2017-01-21 MED ORDER — DEXAMETHASONE SODIUM PHOSPHATE 10 MG/ML IJ SOLN
INTRAMUSCULAR | Status: DC | PRN
  Administered 2017-01-21: 10 mg via INTRAVENOUS

## 2017-01-21 MED ORDER — SODIUM CHLORIDE 0.9% FLUSH: 3.0000 mL | Freq: Two times a day (BID) | INTRAVENOUS | Status: DC

## 2017-01-21 MED ORDER — HYDROMORPHONE HCL 1 MG/ML IJ SOLN
0.2500 mg | INTRAMUSCULAR | Status: DC | PRN
  Administered 2017-01-21 (×3): 0.5 mg via INTRAVENOUS

## 2017-01-21 MED ORDER — METHOCARBAMOL 500 MG PO TABS
500.0000 mg | ORAL_TABLET | Freq: Four times a day (QID) | ORAL | Status: DC | PRN
  Administered 2017-01-21 – 2017-01-22 (×3): 500 mg via ORAL
  Filled 2017-01-21 (×2): qty 1

## 2017-01-21 SURGICAL SUPPLY — 58 items
APL SKNCLS STERI-STRIP NONHPOA (GAUZE/BANDAGES/DRESSINGS) ×1
BENZOIN TINCTURE PRP APPL 2/3 (GAUZE/BANDAGES/DRESSINGS) ×2 IMPLANT
BIT DRILL SKYLINE 12MM (BIT) IMPLANT
BLADE CLIPPER SURG (BLADE) IMPLANT
BONE CERV LORDOTIC 14.5X12X6 (Bone Implant) ×2 IMPLANT
BONE CERV LORDOTIC 14.5X12X7 (Bone Implant) ×2 IMPLANT
BUR ROUND FLUTED 4 SOFT TCH (BURR) ×1 IMPLANT
COLLAR CERV LO CONTOUR FIRM DE (SOFTGOODS) ×1 IMPLANT
CORDS BIPOLAR (ELECTRODE) ×2 IMPLANT
COVER SURGICAL LIGHT HANDLE (MISCELLANEOUS) ×2 IMPLANT
CRADLE DONUT ADULT HEAD (MISCELLANEOUS) ×2 IMPLANT
DRAPE C-ARM 42X72 X-RAY (DRAPES) ×2 IMPLANT
DRAPE HALF SHEET 40X57 (DRAPES) ×2 IMPLANT
DRAPE MICROSCOPE LEICA (MISCELLANEOUS) ×3 IMPLANT
DRILL BIT SKYLINE 12MM (BIT) ×2
DURAPREP 6ML APPLICATOR 50/CS (WOUND CARE) ×2 IMPLANT
ELECT COATED BLADE 2.86 ST (ELECTRODE) ×2 IMPLANT
ELECT REM PT RETURN 9FT ADLT (ELECTROSURGICAL) ×2
ELECTRODE REM PT RTRN 9FT ADLT (ELECTROSURGICAL) ×1 IMPLANT
EVACUATOR 1/8 PVC DRAIN (DRAIN) ×2 IMPLANT
FLOSEAL 10ML (HEMOSTASIS) ×2 IMPLANT
GAUZE SPONGE 4X4 12PLY STRL (GAUZE/BANDAGES/DRESSINGS) ×2 IMPLANT
GLOVE BIOGEL PI IND STRL 8 (GLOVE) ×2 IMPLANT
GLOVE BIOGEL PI INDICATOR 8 (GLOVE) ×2
GLOVE ORTHO TXT STRL SZ7.5 (GLOVE) ×4 IMPLANT
GOWN STRL REUS W/ TWL LRG LVL3 (GOWN DISPOSABLE) ×1 IMPLANT
GOWN STRL REUS W/ TWL XL LVL3 (GOWN DISPOSABLE) ×1 IMPLANT
GOWN STRL REUS W/TWL 2XL LVL3 (GOWN DISPOSABLE) ×2 IMPLANT
GOWN STRL REUS W/TWL LRG LVL3 (GOWN DISPOSABLE) ×2
GOWN STRL REUS W/TWL XL LVL3 (GOWN DISPOSABLE) ×2
GRAFT BNE SPCR VG2 14.5X12X6 (Bone Implant) IMPLANT
GRAFT BNE SPCR VG2 14.5X12X7 (Bone Implant) IMPLANT
HEAD HALTER (SOFTGOODS) ×2 IMPLANT
HEMOSTAT SURGICEL 2X14 (HEMOSTASIS) IMPLANT
KIT BASIN OR (CUSTOM PROCEDURE TRAY) ×2 IMPLANT
KIT ROOM TURNOVER OR (KITS) ×2 IMPLANT
MANIFOLD NEPTUNE II (INSTRUMENTS) IMPLANT
NDL 25GX 5/8IN NON SAFETY (NEEDLE) ×1 IMPLANT
NEEDLE 25GX 5/8IN NON SAFETY (NEEDLE) ×2 IMPLANT
NS IRRIG 1000ML POUR BTL (IV SOLUTION) ×2 IMPLANT
PACK ORTHO CERVICAL (CUSTOM PROCEDURE TRAY) ×2 IMPLANT
PAD ARMBOARD 7.5X6 YLW CONV (MISCELLANEOUS) ×4 IMPLANT
PATTIES SURGICAL .5 X.5 (GAUZE/BANDAGES/DRESSINGS) ×1 IMPLANT
PIN STEINMAN FIXATION KNEE (PIN) IMPLANT
PIN TEMP SKYLINE THREADED (PIN) ×1 IMPLANT
PLATE SKYLINE TWO LEVEL 28MM (Plate) ×1 IMPLANT
RESTRAINT LIMB HOLDER UNIV (RESTRAINTS) IMPLANT
SCREW VARIABLE SELF TAP 12MM (Screw) ×6 IMPLANT
STRIP CLOSURE SKIN 1/2X4 (GAUZE/BANDAGES/DRESSINGS) ×2 IMPLANT
SURGIFLO W/THROMBIN 8M KIT (HEMOSTASIS) IMPLANT
SUT BONE WAX W31G (SUTURE) ×2 IMPLANT
SUT SILK 3 0 (SUTURE) ×2
SUT SILK 3-0 18XBRD TIE 12 (SUTURE) IMPLANT
SUT VIC AB 3-0 X1 27 (SUTURE) ×2 IMPLANT
SUT VICRYL 4-0 PS2 18IN ABS (SUTURE) ×4 IMPLANT
TOWEL OR 17X24 6PK STRL BLUE (TOWEL DISPOSABLE) ×2 IMPLANT
TOWEL OR 17X26 10 PK STRL BLUE (TOWEL DISPOSABLE) ×2 IMPLANT
TRAY FOLEY CATH SILVER 16FR (SET/KITS/TRAYS/PACK) IMPLANT

## 2017-01-21 NOTE — Anesthesia Preprocedure Evaluation (Addendum)
Anesthesia Evaluation  Patient identified by MRN, date of birth, ID band Patient awake    Reviewed: Allergy & Precautions, NPO status , Patient's Chart, lab work & pertinent test results  Airway Mallampati: II  TM Distance: >3 FB Neck ROM: Full    Dental  (+) Teeth Intact, Dental Advisory Given   Pulmonary Current Smoker,    breath sounds clear to auscultation       Cardiovascular hypertension,  Rhythm:Regular Rate:Normal     Neuro/Psych PSYCHIATRIC DISORDERS Anxiety Depression negative neurological ROS     GI/Hepatic Neg liver ROS, GERD  ,  Endo/Other  Hypothyroidism   Renal/GU negative Renal ROS  negative genitourinary   Musculoskeletal negative musculoskeletal ROS (+)   Abdominal   Peds  Hematology   Anesthesia Other Findings   Reproductive/Obstetrics negative OB ROS                            Lab Results  Component Value Date   WBC 4.2 01/12/2017   HGB 13.1 01/12/2017   HCT 39.2 01/12/2017   MCV 89.1 01/12/2017   PLT 257 01/12/2017   Lab Results  Component Value Date   CREATININE 0.69 01/12/2017   BUN 12 01/12/2017   NA 140 01/12/2017   K 3.7 01/12/2017   CL 104 01/12/2017   CO2 25 01/12/2017   No results found for: INR, PROTIME  EKG: normal sinus rhythm.  Anesthesia Physical Anesthesia Plan  ASA: II  Anesthesia Plan: General   Post-op Pain Management:    Induction: Intravenous  PONV Risk Score and Plan: 3 and Ondansetron, Dexamethasone and Midazolam  Airway Management Planned: Oral ETT and Video Laryngoscope Planned  Additional Equipment: None  Intra-op Plan:   Post-operative Plan: Extubation in OR  Informed Consent: I have reviewed the patients History and Physical, chart, labs and discussed the procedure including the risks, benefits and alternatives for the proposed anesthesia with the patient or authorized representative who has indicated his/her  understanding and acceptance.   Dental advisory given  Plan Discussed with: CRNA  Anesthesia Plan Comments:         Anesthesia Quick Evaluation

## 2017-01-21 NOTE — Brief Op Note (Signed)
01/21/2017  11:15 AM  PATIENT:  Jaclyn Schmidt  67 y.o. female  PRE-OPERATIVE DIAGNOSIS:  C3-4, C4-5 Spondylosis, Protrusion  POST-OPERATIVE DIAGNOSIS:  C3-4, C4-5 Spondylosis, Protrusion  PROCEDURE:  Procedure(s): C3-4, C4-5 ANTERIOR CERVICAL DISCECTOMY AND FUSION, ALLOGRAFT, PLATE (N/A)  SURGEON:  Surgeon(s) and Role:    * [name], MD - Primary  PHYSICIAN ASSISTANT: Benjiman Core    ANESTHESIA:   general  EBL:  250 mL   BLOOD ADMINISTERED:none  DRAINS: hemovac   SPECIMEN:  No Specimen  DISPOSITION OF SPECIMEN:  N/A  COUNTS:  YES  TOURNIQUET:  * No tourniquets in log *  DICTATION: .Dragon Dictation  PLAN OF CARE: Admit for overnight observation  PATIENT DISPOSITION:  PACU - hemodynamically stable.

## 2017-01-21 NOTE — Interval H&P Note (Signed)
History and Physical Interval Note:  01/21/2017 7:12 AM  Jaclyn Schmidt  has presented today for surgery, with the diagnosis of C3-4, C4-5 Spondylosis, Protrusion  The various methods of treatment have been discussed with the patient and family. After consideration of risks, benefits and other options for treatment, the patient has consented to  Procedure(s): C3-4, C4-5 ANTERIOR CERVICAL DISCECTOMY AND FUSION, ALLOGRAFT, PLATE (N/A) as a surgical intervention .  The patient's history has been reviewed, patient examined, no change in status, stable for surgery.  I have reviewed the patient's chart and labs.  Questions were answered to the patient's satisfaction.     [name]

## 2017-01-21 NOTE — Discharge Instructions (Addendum)
ORTHOPEDIC DISCHARGE INSTRUCTIONS  -Okay to shower 2 days postop with saran wrapped collar  collar.  Do not apply any creams or ointments to incision  -No driving until further notice  -No lifting, pushing, pulling until further notice  -Cervical collar must be worn at all times, use the extra collar wrapped in saran wrap for shower, after you dry off and dry your hair switch back to the dry collar.   -stay with soft food for at least a week since you will have some problems swallowing after the surgery which will go away.

## 2017-01-21 NOTE — Op Note (Signed)
Preop diagnosis: Cervical spondylosis with cervical disc protrusion C3-4, C4-5.  Postop diagnosis: Same  Procedure: C3-4, C4-5 anterior cervical discectomy and fusion, allograft and plate.  Surgeon: Rodell Perna MD  Assistant: Benjiman Core PA-C medically necessary and present for the entire procedure  Anesthesia General plus Marcaine skin local  Drains: One Hemovac neck  Implants skyline 28 mm plate 12 mm screws x6.  7 mm graft at C4-5 and 6 mm graft at C3-4 level.  Procedure: After standard prepping and draping with arms tucked at side with yellow pads head halter traction application ChloraPrep was used for the neck there squared with towel sterile skin marker used in the prominent skin fold line starting the midline extending to the left Betadine Steri-Drape sterile female standard at the head and thyroid sheets and drapes.  Antibiotics were given prophylactically, procedure completed.  Incision was made platysma was elevated blunt dissection down to the prominent spur which turned out to be at C6-7.  We progressed up to levels and repeat the x-ray with the needle at C4-5 level.  C4-5 level had more prominent disc protrusion on the right side consistent with the patient's symptoms and self-retaining retractors were placed discectomy was performed operative microscope was draped and used progressing down the posterior longitudinal ligament removing the posterior longitudinal ligament removing bone spurs.  Uncovertebral joints were stripped on each side.  Trial sizer showed a 7 mm graft gave better fit was countersunk and some anterior spurs were removed to allow the plate to sit down flat.  There was more difficult exposure at the C3-4 level.  Removing some of the prominent endplate spurs because of bleeding coming out of the bone and Surgi-Flo, bone wax, bipolar cautery regular cautery was used for control.  A 6 mm graft was selected for the upper level and posterior longitudinal ligament was taken  down for decompression.  Graft was countersunk 1 mm.  20 mm plate was selected held with a prong checked under C-arm AP and lateral then screws were placed recheck for final x-rays with the prong still sitting there.  Good position and alignment.  Final problem was removed and replaced with the final 12 mm screw.  Tiny screwdriver was used to lock all 6 screws into the plate irrigation with saline solution operative field was completely dry.  Careful inspection again repeat irrigation repeat inspection with no areas of bleeding.  Platysma closed with 3-0 Vicryl after Hemovac was placed within and out technique in line with the skin incision.  A 4-0 Vicryl subcuticular closure tincture benzoin Steri-Strips 4 x 4's tape and soft collar.  Patient tolerated procedure well was neurologically intact in the recovery room.

## 2017-01-21 NOTE — Anesthesia Procedure Notes (Addendum)
Procedure Name: Intubation Date/Time: 01/21/2017 7:37 AM Performed by: Effie Berkshire, MD Pre-anesthesia Checklist: Patient identified, Emergency Drugs available, Suction available, Patient being monitored and Timeout performed Patient Re-evaluated:Patient Re-evaluated prior to induction Oxygen Delivery Method: Circle system utilized Preoxygenation: Pre-oxygenation with 100% oxygen Induction Type: IV induction Ventilation: Mask ventilation without difficulty Grade View: Grade II Tube type: Oral Tube size: 7.5 mm Airway Equipment and Method: Video-laryngoscopy Placement Confirmation: ETT inserted through vocal cords under direct vision,  positive ETCO2 and breath sounds checked- equal and bilateral Secured at: 21 cm Tube secured with: Tape Dental Injury: Teeth and Oropharynx as per pre-operative assessment  Difficulty Due To: Difficulty was anticipated, Difficult Airway- due to reduced neck mobility, Difficult Airway- due to limited oral opening and Difficult Airway- due to anterior larynx Future Recommendations: Recommend- induction with short-acting agent, and alternative techniques readily available Comments: Elective glidescope

## 2017-01-21 NOTE — Transfer of Care (Signed)
Immediate Anesthesia Transfer of Care Note  Patient: Jaclyn Schmidt  Procedure(s) Performed: C3-4, C4-5 ANTERIOR CERVICAL DISCECTOMY AND FUSION, ALLOGRAFT, PLATE (N/A )  Patient Location: PACU  Anesthesia Type:General  Level of Consciousness: awake, oriented, drowsy, patient cooperative, lethargic and responds to stimulation  Airway & Oxygen Therapy: Patient Spontanous Breathing and Patient connected to face mask oxygen  Post-op Assessment: Report given to RN, Post -op Vital signs reviewed and stable and Patient moving all extremities  Post vital signs: Reviewed and stable  Last Vitals:  Vitals:   01/21/17 0635 01/21/17 1041  BP: 132/79 127/69  Pulse: 66 87  Resp: 18   Temp: 36.5 C   SpO2: 98% 100%    Last Pain:  Vitals:   01/21/17 0653  TempSrc:   PainSc: 3       Patients Stated Pain Goal: 2 (25/95/63 [id number])  Complications: No apparent anesthesia complications

## 2017-01-22 DIAGNOSIS — M5011 Cervical disc disorder with radiculopathy,  high cervical region: Secondary | ICD-10-CM | POA: Diagnosis not present

## 2017-01-22 NOTE — Progress Notes (Signed)
   Subjective: 1 Day Post-Op Procedure(s) (LRB): C3-4, C4-5 ANTERIOR CERVICAL DISCECTOMY AND FUSION, ALLOGRAFT, PLATE (N/A) Patient reports pain as mild and moderate.  Neck pain. Walking in halls   Objective: Vital signs in last 24 hours: Temp:  [97.5 F (36.4 C)-98.6 F (37 C)] 97.5 F (36.4 C) (01/05 0803) Pulse Rate:  [70-95] 81 (01/05 0803) Resp:  [10-17] 16 (01/05 0803) BP: (94-143)/(57-80) 121/66 (01/05 0803) SpO2:  [93 %-100 %] 95 % (01/05 0803)  Intake/Output from previous day: 01/04 0701 - 01/05 0700 In: 1550 [I.V.:1350] Out: 280 [Drains:30; Blood:250] Intake/Output this shift: No intake/output data recorded.  No results for input(s): HGB in the last 72 hours. No results for input(s): WBC, RBC, HCT, PLT in the last 72 hours. No results for input(s): NA, K, CL, CO2, BUN, CREATININE, GLUCOSE, CALCIUM in the last 72 hours. No results for input(s): LABPT, INR in the last 72 hours.  walking in hall, no arm pain.  Dg Cervical Spine 2-3 Views  Result Date: 01/21/2017 CLINICAL DATA:  Intraoperative imaging for C3-5 ACDF. EXAM: CERVICAL SPINE - 2-3 VIEW; DG C-ARM 61-120 MIN COMPARISON:  MRI cervical spine 12/07/2016. FINDINGS: We are provided with 4 fluoroscopic intraoperative spot views of the cervical spine. Images demonstrate anterior plate and screws and interbody spacers in place from C3-C5. No acute abnormality is identified. IMPRESSION: Intraoperative imaging for C3-5 ACDF. Electronically Signed   By: Inge Rise M.D.   On: 01/21/2017 10:42    Assessment/Plan: 1 Day Post-Op Procedure(s) (LRB): C3-4, C4-5 ANTERIOR CERVICAL DISCECTOMY AND FUSION, ALLOGRAFT, PLATE (N/A) Plan: discharge home. Activities discussed, keep collar on . Office one week .has extra collar for shower. Dressing changed by RN.   [name] 01/22/2017, 8:45 AM

## 2017-01-22 NOTE — Anesthesia Postprocedure Evaluation (Signed)
Anesthesia Post Note  Patient: Jaclyn Schmidt  Procedure(s) Performed: C3-4, C4-5 ANTERIOR CERVICAL DISCECTOMY AND FUSION, ALLOGRAFT, PLATE (N/A )     Patient location during evaluation: PACU Anesthesia Type: General Level of consciousness: awake and alert Pain management: pain level controlled Vital Signs Assessment: post-procedure vital signs reviewed and stable Respiratory status: spontaneous breathing, nonlabored ventilation, respiratory function stable and patient connected to nasal cannula oxygen Cardiovascular status: blood pressure returned to baseline and stable Postop Assessment: no apparent nausea or vomiting Anesthetic complications: no    Last Vitals:  Vitals:   01/22/17 0315 01/22/17 0803  BP: 119/61 121/66  Pulse: 95 81  Resp: 16 16  Temp: 36.7 C (!) 36.4 C  SpO2: 93% 95%                   Effie Berkshire

## 2017-01-22 NOTE — Progress Notes (Signed)
Patient alert and oriented, mae's well, voiding adequate amount of urine, swallowing without difficulty,  c/o mild pain at time of discharge. Patient discharged home with family. Script and discharged instructions given to patient. Patient and family stated understanding of instructions given. Patient has an appointment with Dr. Yates   

## 2017-01-24 ENCOUNTER — Encounter (HOSPITAL_COMMUNITY): Payer: Self-pay | Admitting: Orthopaedic Surgery

## 2017-01-24 ENCOUNTER — Telehealth (INDEPENDENT_AMBULATORY_CARE_PROVIDER_SITE_OTHER): Payer: Self-pay | Admitting: Orthopaedic Surgery

## 2017-01-24 NOTE — Telephone Encounter (Signed)
Jaclyn Schmidt with [employer] left voicemail message needing diag. ICD-10 codes, Test or clinical findings to support, 1st day off of work, estimated return to work date, treatment plan. The number to contact Jaclyn Schmidt is [phone] X [phone]

## 2017-01-25 ENCOUNTER — Encounter (HOSPITAL_COMMUNITY): Payer: Self-pay | Admitting: Orthopaedic Surgery

## 2017-01-25 ENCOUNTER — Telehealth: Payer: Self-pay | Admitting: Physician Assistant

## 2017-01-25 DIAGNOSIS — F339 Major depressive disorder, recurrent, unspecified: Secondary | ICD-10-CM

## 2017-01-25 MED ORDER — SERTRALINE HCL 50 MG PO TABS: 50.0000 mg | ORAL_TABLET | Freq: Every day | ORAL | 0 refills | Status: DC

## 2017-01-25 NOTE — Telephone Encounter (Signed)
Patient aware original rx was sent in for 90 but we will resend to pharmacy for her.

## 2017-01-27 ENCOUNTER — Encounter (INDEPENDENT_AMBULATORY_CARE_PROVIDER_SITE_OTHER): Payer: Self-pay | Admitting: Orthopaedic Surgery

## 2017-01-27 ENCOUNTER — Other Ambulatory Visit: Payer: Self-pay | Admitting: Physician Assistant

## 2017-01-27 ENCOUNTER — Ambulatory Visit (INDEPENDENT_AMBULATORY_CARE_PROVIDER_SITE_OTHER): Payer: BLUE CROSS/BLUE SHIELD

## 2017-01-27 ENCOUNTER — Ambulatory Visit (INDEPENDENT_AMBULATORY_CARE_PROVIDER_SITE_OTHER): Payer: BLUE CROSS/BLUE SHIELD | Admitting: Orthopaedic Surgery

## 2017-01-27 VITALS — BP 107/71 | HR 83

## 2017-01-27 DIAGNOSIS — Z981 Arthrodesis status: Secondary | ICD-10-CM | POA: Diagnosis not present

## 2017-01-27 DIAGNOSIS — F339 Major depressive disorder, recurrent, unspecified: Secondary | ICD-10-CM

## 2017-01-27 MED ORDER — SERTRALINE HCL 50 MG PO TABS: 50.0000 mg | ORAL_TABLET | Freq: Every day | ORAL | 0 refills | Status: DC

## 2017-01-27 NOTE — Progress Notes (Signed)
   Post-Op Visit Note   Patient: Jaclyn Schmidt           Date of Birth: 18-Aug-1950           MRN: [id number] Visit Date: 01/27/2017 PCP: [name], PA-C   Assessment & Plan:  Chief Complaint:  Chief Complaint  Patient presents with  . Neck - Routine Post Op   Visit Diagnoses:  1. Status post cervical spinal fusion     Plan: New soft collar as before.  Return 5 weeks for lateral flexion extension C-spine x-rays.  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  She needs to be off her pain medication before she resumes work activity.  Otherwise recheck 5 weeks x-rays as above.  Follow-Up Instructions: No Follow-up on file.   Orders:  Orders Placed This Encounter  Procedures  . XR Cervical Spine 2 or 3 views   No orders of the defined types were placed in this encounter.   Imaging: No results found.  PMFS History: Patient Active Problem List   Diagnosis Date Noted  . Cervical spinal stenosis 01/21/2017  . Anal sphincter incompetence 12/29/2016  . Depression, recurrent ([place]) 12/29/2016  . Other spondylosis with radiculopathy, cervical region 12/02/2016  . Chronic midline thoracic back pain 09/21/2016  . Lumbar pain 09/21/2016  . Gall bladder disease 06/10/2011  . Cholelithiasis 06/03/2011   Past Medical History:  Diagnosis Date  . Anxiety   . Cholelithiasis   . GERD (gastroesophageal reflux disease)   . History of kidney stones   . Hyperlipidemia   . Hypertension   . Hypothyroidism   . Irritable bowel syndrome   . Pneumonia   . Thyroid disease   . Vitamin D deficiency     Family History  Problem Relation Age of Onset  . Cancer Mother        stomach  . Heart disease Father   . Cancer Maternal Aunt        ovarian  . Cancer Paternal Aunt        breast    Past Surgical History:  Procedure Laterality Date  . ABDOMINAL HYSTERECTOMY  [id number]  . ANTERIOR CERVICAL DECOMP/DISCECTOMY  FUSION N/A 01/21/2017   Procedure: C3-4, C4-5 ANTERIOR CERVICAL DISCECTOMY AND FUSION, ALLOGRAFT, PLATE;  Surgeon: [name], MD;  Location: [facility];  Service: Orthopedics;  Laterality: N/A;  . APPENDECTOMY  1996  . BREAST MASS EXCISION  1997   left  . CHOLECYSTECTOMY  07/15/2011   Procedure: LAPAROSCOPIC CHOLECYSTECTOMY WITH INTRAOPERATIVE CHOLANGIOGRAM;  Surgeon: Shann Medal, MD;  Location: [facility];  Service: General;  Laterality: N/A;  Cholecystectomy with IOC  . TOE FUSION    . TUBAL LIGATION  1980   Social History   Occupational History  . Not on file  Tobacco Use  . Smoking status: Current Every Day Smoker    Packs/day: 0.25    Years: 36.00    Pack years: 9.00    Types: Cigarettes    Start date: 09/30/1973    Last attempt to quit: 01/18/2009    Years since quitting: 8.0  . Smokeless tobacco: Never Used  . Tobacco comment: pt started smoking 5-6 cig a day 08/2016  Substance and Sexual Activity  . Alcohol use: No    Alcohol/week: 0.0 oz  . Drug use: No  . Sexual activity: Not on file

## 2017-01-27 NOTE — Telephone Encounter (Signed)
rx sent to [facility] and pt is aware.

## 2017-02-04 NOTE — Discharge Summary (Signed)
Patient ID: Jaclyn Schmidt MRN: [id number] DOB/AGE: 1950/04/22 67 y.o.  Admit date: 01/21/2017 Discharge date: 02/04/2017  Admission Diagnoses:  Active Problems:   Cervical spinal stenosis   Discharge Diagnoses:  Active Problems:   Cervical spinal stenosis  status post Procedure(s): C3-4, C4-5 ANTERIOR CERVICAL DISCECTOMY AND FUSION, ALLOGRAFT, PLATE  Past Medical History:  Diagnosis Date  . Anxiety   . Cholelithiasis   . GERD (gastroesophageal reflux disease)   . History of kidney stones   . Hyperlipidemia   . Hypertension   . Hypothyroidism   . Irritable bowel syndrome   . Pneumonia   . Thyroid disease   . Vitamin D deficiency     Surgeries: Procedure(s): C3-4, C4-5 ANTERIOR CERVICAL DISCECTOMY AND FUSION, ALLOGRAFT, PLATE on 06/25/3417   Consultants:   Discharged Condition: Improved  Hospital Course: Jaclyn Schmidt is an 67 y.o. female who was admitted 01/21/2017 for operative treatment of  cervical stenosis. Patient failed conservative treatments (please see the history and physical for the specifics) and had severe unremitting pain that affects sleep, daily activities and work/hobbies. After pre-op clearance, the patient was taken to the operating room on 01/21/2017 and underwent  Procedure(s): C3-4, C4-5 ANTERIOR CERVICAL DISCECTOMY AND FUSION, ALLOGRAFT, PLATE.    Patient was given perioperative antibiotics:  Anti-infectives (From admission, onward)   Start     Dose/Rate Route Frequency Ordered Stop   01/21/17 1930  vancomycin (VANCOCIN) IVPB 1000 mg/200 mL premix     1,000 mg 200 mL/hr over 60 Minutes Intravenous  Once 01/21/17 1359 01/21/17 2058   01/21/17 0630  vancomycin (VANCOCIN) IVPB 1000 mg/200 mL premix     1,000 mg 200 mL/hr over 60 Minutes Intravenous To ShortStay Surgical 01/20/17 1458 01/21/17 0725       Patient was given sequential compression devices and early ambulation to prevent DVT.   Patient benefited maximally from hospital stay and  there were no complications. At the time of discharge, the patient was urinating/moving their bowels without difficulty, tolerating a regular diet, pain is controlled with oral pain medications and they have been cleared by PT/OT.   Recent vital signs: No data found.   Recent laboratory studies: No results for input(s): WBC, HGB, HCT, PLT, NA, K, CL, CO2, BUN, CREATININE, GLUCOSE, INR, CALCIUM in the last 72 hours.  Invalid input(s): PT, 2   Discharge Medications:   Allergies as of 01/22/2017      Reactions   Penicillins Rash, Other (See Comments)   PATIENT HAS HAD A PCN REACTION WITH IMMEDIATE RASH, FACIAL/TONGUE/THROAT SWELLING, SOB, OR LIGHTHEADEDNESS WITH HYPOTENSION:  #  #  #  YES  #  #  #   Has patient had a PCN reaction causing severe rash involving mucus membranes or skin necrosis: No Has patient had a PCN reaction that required hospitalization: No Has patient had a PCN reaction occurring within the last 10 years: No If all of the above answers are "NO", then may proceed with Cephalosporin use.      Medication List    STOP taking these medications   cyclobenzaprine 10 MG tablet Commonly known as:  FLEXERIL   ibuprofen 200 MG tablet Commonly known as:  ADVIL,MOTRIN   naproxen sodium 220 MG tablet Commonly known as:  ALEVE     TAKE these medications   ALPRAZolam 1 MG tablet Commonly known as:  XANAX Take 0.5 tablets (0.5 mg total) by mouth 3 (three) times daily. What changed:    how much to  take  when to take this  reasons to take this   BIOTIN PO Take 2 tablets by mouth daily.   cholestyramine 4 GM/DOSE powder Commonly known as:  QUESTRAN Take 4 g by mouth 2 (two) times daily as needed (FOR DIARRHEA.).   fluticasone 50 MCG/ACT nasal spray Commonly known as:  FLONASE Place 2 sprays into both nostrils daily as needed (FOR ALLERGIES.).   glycopyrrolate 2 MG tablet Commonly known as:  ROBINUL Take 2 mg by mouth daily as needed (for diarrhea.).    levothyroxine 88 MCG tablet Commonly known as:  SYNTHROID, LEVOTHROID Take 88 mcg by mouth 3 (three) times a week. Saturday, Sunday, & Monday   levothyroxine 75 MCG tablet Commonly known as:  SYNTHROID, LEVOTHROID Take 75 mcg by mouth 4 (four) times a week. Tuesday, Wednesday, Thursday,  & Friday   meclizine 25 MG tablet Commonly known as:  ANTIVERT Take 1 tablet (25 mg total) by mouth 3 (three) times daily as needed for dizziness.   methocarbamol 500 MG tablet Commonly known as:  ROBAXIN Take 1 tablet (500 mg total) by mouth every 6 (six) hours as needed for muscle spasms.   oxyCODONE-acetaminophen 5-325 MG tablet Commonly known as:  PERCOCET/ROXICET Take 1-2 tablets by mouth every 6 (six) hours as needed for severe pain.   pravastatin 40 MG tablet Commonly known as:  PRAVACHOL Take 1 tablet (40 mg total) by mouth daily. What changed:  when to take this   VITAMIN D3 PO Take 2 tablets by mouth daily.       Diagnostic Studies: Dg Cervical Spine 2-3 Views  Result Date: 01/21/2017 CLINICAL DATA:  Intraoperative imaging for C3-5 ACDF. EXAM: CERVICAL SPINE - 2-3 VIEW; DG C-ARM 61-120 MIN COMPARISON:  MRI cervical spine 12/07/2016. FINDINGS: We are provided with 4 fluoroscopic intraoperative spot views of the cervical spine. Images demonstrate anterior plate and screws and interbody spacers in place from C3-C5. No acute abnormality is identified. IMPRESSION: Intraoperative imaging for C3-5 ACDF. Electronically Signed   By: Inge Rise M.D.   On: 01/21/2017 10:42   Xr Cervical Spine 2 Or 3 Views  Result Date: 01/27/2017 The x-rays cervical spine demonstrate C3-4 C4-5 2 level cervical fusion.  Plate and screws are in good position bone graft looks good.  Minimal prevertebral swelling. Impression: Postoperative satisfactory position C3-4 C4-5 ACDF with plate.     Follow-up Information    Schedule an appointment as soon as possible for a visit with [name], MD.    Specialty:  Orthopedic Surgery Why:  need return office visit one week postop Contact information: [place] [address] [phone]           Discharge Plan:  discharge to home  Disposition:     Signed: Benjiman Core  02/04/2017, 3:06 PM

## 2017-02-14 ENCOUNTER — Encounter: Payer: Self-pay | Admitting: Physician Assistant

## 2017-02-14 ENCOUNTER — Ambulatory Visit: Payer: BLUE CROSS/BLUE SHIELD | Admitting: Physician Assistant

## 2017-02-14 VITALS — BP 106/67 | HR 90 | Temp 96.4°F | Ht 62.0 in | Wt 131.6 lb

## 2017-02-14 DIAGNOSIS — J111 Influenza due to unidentified influenza virus with other respiratory manifestations: Secondary | ICD-10-CM | POA: Diagnosis not present

## 2017-02-14 DIAGNOSIS — R52 Pain, unspecified: Secondary | ICD-10-CM

## 2017-02-14 DIAGNOSIS — J029 Acute pharyngitis, unspecified: Secondary | ICD-10-CM | POA: Diagnosis not present

## 2017-02-14 LAB — VERITOR FLU A/B WAIVED
Influenza A: NEGATIVE
Influenza B: NEGATIVE

## 2017-02-14 MED ORDER — OSELTAMIVIR PHOSPHATE 75 MG PO CAPS: 75.0000 mg | ORAL_CAPSULE | Freq: Two times a day (BID) | ORAL | 0 refills | Status: DC

## 2017-02-14 MED ORDER — AZITHROMYCIN 250 MG PO TABS: ORAL_TABLET | ORAL | 0 refills | Status: DC

## 2017-02-14 NOTE — Patient Instructions (Signed)
In a few days you may receive a survey in the mail or online from [employer] regarding your visit with us today. Please take a moment to fill this out. Your feedback is very important to our whole office. It can help us better understand your needs as well as improve your experience and satisfaction. Thank you for taking your time to complete it. We care about you.  Lisset Ketchem, PA-C  

## 2017-02-14 NOTE — Progress Notes (Signed)
BP 106/67   Pulse 90   Temp (!) 96.4 F (35.8 C) (Oral)   Ht 5\' 2"  (1.575 m)   Wt 131 lb 9.6 oz (59.7 kg)   BMI 24.07 kg/m    Subjective:    Patient ID: Jaclyn Schmidt, female    DOB: Jan 29, 1950, 67 y.o.   MRN: [id number]  HPI: Jaclyn Schmidt is a 67 y.o. female presenting on 02/14/2017 for Chills; Generalized Body Aches; and Cough  This patient has had less than 2 days severe fever, chills, myalgias.  Complains of sinus headache and postnasal drainage. There is copious drainage at times. Associated sore throat, decreased appetite and headache.  Has been exposed to influenza.  She has already taken 3 doses of tamiflu from her Daughter in Sports coach.  Relevant past medical, surgical, family and social history reviewed and updated as indicated. Allergies and medications reviewed and updated.  Past Medical History:  Diagnosis Date  . Anxiety   . Cholelithiasis   . GERD (gastroesophageal reflux disease)   . History of kidney stones   . Hyperlipidemia   . Hypertension   . Hypothyroidism   . Irritable bowel syndrome   . Pneumonia   . Thyroid disease   . Vitamin D deficiency     Past Surgical History:  Procedure Laterality Date  . ABDOMINAL HYSTERECTOMY  [id number]  . ANTERIOR CERVICAL DECOMP/DISCECTOMY FUSION N/A 01/21/2017   Procedure: C3-4, C4-5 ANTERIOR CERVICAL DISCECTOMY AND FUSION, ALLOGRAFT, PLATE;  Surgeon: [name], MD;  Location: [facility];  Service: Orthopedics;  Laterality: N/A;  . APPENDECTOMY  1996  . BREAST MASS EXCISION  1997   left  . CHOLECYSTECTOMY  07/15/2011   Procedure: LAPAROSCOPIC CHOLECYSTECTOMY WITH INTRAOPERATIVE CHOLANGIOGRAM;  Surgeon: Shann Medal, MD;  Location: [facility];  Service: General;  Laterality: N/A;  Cholecystectomy with IOC  . TOE FUSION    . TUBAL LIGATION  1980    Review of Systems  Constitutional: Positive for appetite change, chills, fatigue and fever. Negative for activity change.  HENT: Positive for congestion, postnasal drip and sore  throat.   Eyes: Negative.   Respiratory: Negative for cough and wheezing.   Cardiovascular: Negative.  Negative for chest pain, palpitations and leg swelling.  Gastrointestinal: Negative.   Genitourinary: Negative.   Musculoskeletal: Positive for myalgias.  Skin: Negative.   Neurological: Positive for headaches.    Allergies as of 02/14/2017      Reactions   Penicillins Rash, Other (See Comments)   PATIENT HAS HAD A PCN REACTION WITH IMMEDIATE RASH, FACIAL/TONGUE/THROAT SWELLING, SOB, OR LIGHTHEADEDNESS WITH HYPOTENSION:  #  #  #  YES  #  #  #   Has patient had a PCN reaction causing severe rash involving mucus membranes or skin necrosis: No Has patient had a PCN reaction that required hospitalization: No Has patient had a PCN reaction occurring within the last 10 years: No If all of the above answers are "NO", then may proceed with Cephalosporin use.      Medication List        Accurate as of 02/14/17  2:08 PM. Always use your most recent med list.          ALPRAZolam 1 MG tablet Commonly known as:  XANAX Take 0.5 tablets (0.5 mg total) by mouth 3 (three) times daily.   azithromycin 250 MG tablet Commonly known as:  ZITHROMAX Z-PAK Take as directed   BIOTIN PO Take 2 tablets by mouth daily.   cholestyramine  4 GM/DOSE powder Commonly known as:  QUESTRAN Take 4 g by mouth 2 (two) times daily as needed (FOR DIARRHEA.).   fluticasone 50 MCG/ACT nasal spray Commonly known as:  FLONASE Place 2 sprays into both nostrils daily as needed (FOR ALLERGIES.).   glycopyrrolate 2 MG tablet Commonly known as:  ROBINUL Take 2 mg by mouth daily as needed (for diarrhea.).   levothyroxine 88 MCG tablet Commonly known as:  SYNTHROID, LEVOTHROID Take 88 mcg by mouth 3 (three) times a week. Saturday, Sunday, & Monday   levothyroxine 75 MCG tablet Commonly known as:  SYNTHROID, LEVOTHROID Take 75 mcg by mouth 4 (four) times a week. Tuesday, Wednesday, Thursday,  & Friday     meclizine 25 MG tablet Commonly known as:  ANTIVERT Take 1 tablet (25 mg total) by mouth 3 (three) times daily as needed for dizziness.   methocarbamol 500 MG tablet Commonly known as:  ROBAXIN Take 1 tablet (500 mg total) by mouth every 6 (six) hours as needed for muscle spasms.   oseltamivir 75 MG capsule Commonly known as:  TAMIFLU Take 1 capsule (75 mg total) by mouth 2 (two) times daily.   oxyCODONE-acetaminophen 5-325 MG tablet Commonly known as:  PERCOCET/ROXICET Take 1-2 tablets by mouth every 6 (six) hours as needed for severe pain.   pravastatin 40 MG tablet Commonly known as:  PRAVACHOL Take 1 tablet (40 mg total) by mouth daily.   sertraline 50 MG tablet Commonly known as:  ZOLOFT Take 1 tablet (50 mg total) by mouth daily.   VITAMIN D3 PO Take 2 tablets by mouth daily.          Objective:    BP 106/67   Pulse 90   Temp (!) 96.4 F (35.8 C) (Oral)   Ht 5\' 2"  (1.575 m)   Wt 131 lb 9.6 oz (59.7 kg)   BMI 24.07 kg/m   Allergies  Allergen Reactions  . Penicillins Rash and Other (See Comments)    PATIENT HAS HAD A PCN REACTION WITH IMMEDIATE RASH, FACIAL/TONGUE/THROAT SWELLING, SOB, OR LIGHTHEADEDNESS WITH HYPOTENSION:  #  #  #  YES  #  #  #   Has patient had a PCN reaction causing severe rash involving mucus membranes or skin necrosis: No Has patient had a PCN reaction that required hospitalization: No Has patient had a PCN reaction occurring within the last 10 years: No If all of the above answers are "NO", then may proceed with Cephalosporin use.     Physical Exam  Constitutional: She is oriented to person, place, and time. She appears well-developed and well-nourished. No distress.  HENT:  Head: Normocephalic and atraumatic.  Right Ear: Tympanic membrane normal.  Left Ear: Tympanic membrane normal.  Nose: Mucosal edema and rhinorrhea present. Right sinus exhibits no frontal sinus tenderness. Left sinus exhibits no frontal sinus tenderness.   Mouth/Throat: Posterior oropharyngeal erythema present. No oropharyngeal exudate or tonsillar abscesses.  Eyes: Conjunctivae and EOM are normal. Pupils are equal, round, and reactive to light.  Neck: Normal range of motion.  Cardiovascular: Normal rate, regular rhythm, normal heart sounds, intact distal pulses and normal pulses.  Pulmonary/Chest: Effort normal and breath sounds normal. No respiratory distress.  Abdominal: Soft. Bowel sounds are normal.  Neurological: She is alert and oriented to person, place, and time. She has normal reflexes.  Skin: Skin is warm and dry. No rash noted.  Psychiatric: She has a normal mood and affect. Her behavior is normal. Judgment and thought content normal.  Nursing note and vitals reviewed.   Results for orders placed or performed in visit on 02/14/17  Veritor Flu A/B Waived  Result Value Ref Range   Influenza A Negative Negative   Influenza B Negative Negative      Assessment & Plan:   1. Body aches - Veritor Flu A/B Waived  2. Influenza - oseltamivir (TAMIFLU) 75 MG capsule; Take 1 capsule (75 mg total) by mouth 2 (two) times daily.  Dispense: 10 capsule; Refill: 0  3. Pharyngitis, unspecified etiology - azithromycin (ZITHROMAX Z-PAK) 250 MG tablet; Take as directed  Dispense: 6 each; Refill: 0    Current Outpatient Medications:  .  ALPRAZolam (XANAX) 1 MG tablet, Take 0.5 tablets (0.5 mg total) by mouth 3 (three) times daily. (Patient taking differently: Take 1 mg by mouth 3 (three) times daily as needed (FOR ANXIETY). ), Disp: 120 tablet, Rfl: 1 .  azithromycin (ZITHROMAX Z-PAK) 250 MG tablet, Take as directed, Disp: 6 each, Rfl: 0 .  BIOTIN PO, Take 2 tablets by mouth daily., Disp: , Rfl:  .  Cholecalciferol (VITAMIN D3 PO), Take 2 tablets by mouth daily., Disp: , Rfl:  .  cholestyramine (QUESTRAN) 4 GM/DOSE powder, Take 4 g by mouth 2 (two) times daily as needed (FOR DIARRHEA.). , Disp: , Rfl:  .  fluticasone (FLONASE) 50 MCG/ACT  nasal spray, Place 2 sprays into both nostrils daily as needed (FOR ALLERGIES.). , Disp: , Rfl: 2 .  glycopyrrolate (ROBINUL) 2 MG tablet, Take 2 mg by mouth daily as needed (for diarrhea.)., Disp: , Rfl:  .  levothyroxine (SYNTHROID, LEVOTHROID) 75 MCG tablet, Take 75 mcg by mouth 4 (four) times a week. Tuesday, Wednesday, Thursday,  & Friday, Disp: , Rfl:  .  levothyroxine (SYNTHROID, LEVOTHROID) 88 MCG tablet, Take 88 mcg by mouth 3 (three) times a week. Saturday, Sunday, & Monday, Disp: , Rfl:  .  meclizine (ANTIVERT) 25 MG tablet, Take 1 tablet (25 mg total) by mouth 3 (three) times daily as needed for dizziness., Disp: 90 tablet, Rfl: 1 .  methocarbamol (ROBAXIN) 500 MG tablet, Take 1 tablet (500 mg total) by mouth every 6 (six) hours as needed for muscle spasms., Disp: 60 tablet, Rfl: 0 .  oseltamivir (TAMIFLU) 75 MG capsule, Take 1 capsule (75 mg total) by mouth 2 (two) times daily., Disp: 10 capsule, Rfl: 0 .  oxyCODONE-acetaminophen (PERCOCET/ROXICET) 5-325 MG tablet, Take 1-2 tablets by mouth every 6 (six) hours as needed for severe pain., Disp: 50 tablet, Rfl: 0 .  pravastatin (PRAVACHOL) 40 MG tablet, Take 1 tablet (40 mg total) by mouth daily. (Patient taking differently: Take 40 mg by mouth at bedtime. ), Disp: 90 tablet, Rfl: 3 .  sertraline (ZOLOFT) 50 MG tablet, Take 1 tablet (50 mg total) by mouth daily., Disp: 90 tablet, Rfl: 0 Continue all other maintenance medications as listed above.  Follow up plan: Return if symptoms worsen or fail to improve.  Educational handout given for [place] PA-C [facility] [address]  [place], [place] [address] [phone]   02/14/2017, 2:08 PM

## 2017-02-24 ENCOUNTER — Encounter (INDEPENDENT_AMBULATORY_CARE_PROVIDER_SITE_OTHER): Payer: Self-pay | Admitting: Orthopaedic Surgery

## 2017-02-24 ENCOUNTER — Ambulatory Visit (INDEPENDENT_AMBULATORY_CARE_PROVIDER_SITE_OTHER): Payer: BLUE CROSS/BLUE SHIELD | Admitting: Orthopaedic Surgery

## 2017-02-24 ENCOUNTER — Ambulatory Visit (INDEPENDENT_AMBULATORY_CARE_PROVIDER_SITE_OTHER): Payer: BLUE CROSS/BLUE SHIELD

## 2017-02-24 VITALS — BP 107/68 | HR 89

## 2017-02-24 DIAGNOSIS — Z981 Arthrodesis status: Secondary | ICD-10-CM

## 2017-02-24 NOTE — Progress Notes (Signed)
Post-Op Visit Note   Patient: Jaclyn Schmidt           Date of Birth: July 30, 1950           MRN: [id number] Visit Date: 02/24/2017 PCP: [name], PA-C   Assessment & Plan: Post 2 level cervical fusion C3-4 C4-5.  Chief Complaint:  Chief Complaint  Patient presents with  . Neck - Routine Post Op   Visit Diagnoses:  1. Status post cervical spinal fusion     Plan: Patient will keep her collar on until a week from tomorrow.  Work slip given for work resumption on 03/08/2015.  Good relief of preop arm pain.  She still has some low back issues and can return at some point in the future if she like to get this further evaluated.  She is happy with the surgical result.  Follow-Up Instructions: No Follow-up on file.   Orders:  Orders Placed This Encounter  Procedures  . XR Cervical Spine 2 or 3 views   No orders of the defined types were placed in this encounter.   Imaging: No results found.  PMFS History: Patient Active Problem List   Diagnosis Date Noted  . Cervical spinal stenosis 01/21/2017  . Anal sphincter incompetence 12/29/2016  . Depression, recurrent ([place]) 12/29/2016  . Other spondylosis with radiculopathy, cervical region 12/02/2016  . Chronic midline thoracic back pain 09/21/2016  . Lumbar pain 09/21/2016  . Gall bladder disease 06/10/2011  . Cholelithiasis 06/03/2011   Past Medical History:  Diagnosis Date  . Anxiety   . Cholelithiasis   . GERD (gastroesophageal reflux disease)   . History of kidney stones   . Hyperlipidemia   . Hypertension   . Hypothyroidism   . Irritable bowel syndrome   . Pneumonia   . Thyroid disease   . Vitamin D deficiency     Family History  Problem Relation Age of Onset  . Cancer Mother        stomach  . Heart disease Father   . Cancer Maternal Aunt        ovarian  . Cancer Paternal Aunt        breast    Past Surgical History:  Procedure Laterality Date  . ABDOMINAL HYSTERECTOMY  [id number]  . ANTERIOR  CERVICAL DECOMP/DISCECTOMY FUSION N/A 01/21/2017   Procedure: C3-4, C4-5 ANTERIOR CERVICAL DISCECTOMY AND FUSION, ALLOGRAFT, PLATE;  Surgeon: [name], MD;  Location: [facility];  Service: Orthopedics;  Laterality: N/A;  . APPENDECTOMY  1996  . BREAST MASS EXCISION  1997   left  . CHOLECYSTECTOMY  07/15/2011   Procedure: LAPAROSCOPIC CHOLECYSTECTOMY WITH INTRAOPERATIVE CHOLANGIOGRAM;  Surgeon: Shann Medal, MD;  Location: [facility];  Service: General;  Laterality: N/A;  Cholecystectomy with IOC  . TOE FUSION    . TUBAL LIGATION  1980   Social History   Occupational History  . Not on file  Tobacco Use  . Smoking status: Current Every Day Smoker    Packs/day: 0.25    Years: 36.00    Pack years: 9.00    Types: Cigarettes    Start date: 09/30/1973    Last attempt to quit: 01/18/2009    Years since quitting: 8.1  . Smokeless tobacco: Never Used  . Tobacco comment: pt started smoking 5-6 cig a day 08/2016  Substance and Sexual Activity  . Alcohol use: No    Alcohol/week: 0.0 oz  . Drug use: No  . Sexual activity: Not on file

## 2017-03-04 ENCOUNTER — Encounter: Payer: BLUE CROSS/BLUE SHIELD | Admitting: Physician Assistant

## 2017-03-09 ENCOUNTER — Encounter: Payer: Self-pay | Admitting: Physician Assistant

## 2017-03-09 ENCOUNTER — Ambulatory Visit (INDEPENDENT_AMBULATORY_CARE_PROVIDER_SITE_OTHER): Payer: BLUE CROSS/BLUE SHIELD

## 2017-03-09 ENCOUNTER — Ambulatory Visit (INDEPENDENT_AMBULATORY_CARE_PROVIDER_SITE_OTHER): Payer: BLUE CROSS/BLUE SHIELD | Admitting: Physician Assistant

## 2017-03-09 VITALS — BP 114/71 | HR 81 | Ht 62.0 in | Wt 137.2 lb

## 2017-03-09 DIAGNOSIS — F339 Major depressive disorder, recurrent, unspecified: Secondary | ICD-10-CM

## 2017-03-09 DIAGNOSIS — M8580 Other specified disorders of bone density and structure, unspecified site: Secondary | ICD-10-CM

## 2017-03-09 DIAGNOSIS — Z0000 Encounter for general adult medical examination without abnormal findings: Secondary | ICD-10-CM

## 2017-03-09 DIAGNOSIS — M8588 Other specified disorders of bone density and structure, other site: Secondary | ICD-10-CM | POA: Diagnosis not present

## 2017-03-09 MED ORDER — SERTRALINE HCL 50 MG PO TABS: 50.0000 mg | ORAL_TABLET | Freq: Every day | ORAL | 3 refills | Status: DC

## 2017-03-09 NOTE — Progress Notes (Signed)
BP 114/71   Pulse 81   Ht 5\' 2"  (1.575 m)   Wt 137 lb 3.2 oz (62.2 kg)   BMI 25.09 kg/m    Subjective:    Patient ID: Jaclyn Schmidt, female    DOB: 04/01/1950, 67 y.o.   MRN: [id number]  Jaclyn Schmidt is a 67 y.o. female presenting on 03/09/2017 for Annual Exam   HPI This patient comes in for annual well physical examination. All medications are reviewed today. There are no reports of any problems with the medications. All of the medical conditions are reviewed and updated.  Lab work is reviewed and will be ordered as medically necessary. There are no new problems reported with today's visit.  Patient reports doing well overall. She needs a new order for DEXA, history of postmenopause state, osteopenia  Past Medical History:  Diagnosis Date  . Anxiety   . Cholelithiasis   . GERD (gastroesophageal reflux disease)   . History of kidney stones   . Hyperlipidemia   . Hypertension   . Hypothyroidism   . Irritable bowel syndrome   . Pneumonia   . Thyroid disease   . Vitamin D deficiency    Relevant past medical, surgical, family and social history reviewed and updated as indicated. Interim medical history since our last visit reviewed. Allergies and medications reviewed and updated.   Data reviewed from any sources in EPIC.  Review of Systems  Constitutional: Negative.  Negative for activity change, fatigue and fever.  HENT: Negative.   Eyes: Negative.   Respiratory: Negative.  Negative for cough.   Cardiovascular: Negative.  Negative for chest pain.  Gastrointestinal: Negative.  Negative for abdominal pain.  Endocrine: Negative.   Genitourinary: Negative.  Negative for dysuria.  Musculoskeletal: Negative.   Skin: Negative.   Neurological: Negative.      Social History   Socioeconomic History  . Marital status: Divorced    Spouse name: Not on file  . Number of children: Not on file  . Years of education: Not on file  . Highest education level: Not on file    Social Needs  . Financial resource strain: Not on file  . Food insecurity - worry: Not on file  . Food insecurity - inability: Not on file  . Transportation needs - medical: Not on file  . Transportation needs - non-medical: Not on file  Occupational History  . Not on file  Tobacco Use  . Smoking status: Current Every Day Smoker    Packs/day: 0.25    Years: 36.00    Pack years: 9.00    Types: Cigarettes    Start date: 09/30/1973    Last attempt to quit: 01/18/2009    Years since quitting: 8.1  . Smokeless tobacco: Never Used  . Tobacco comment: pt started smoking 5-6 cig a day 08/2016  Substance and Sexual Activity  . Alcohol use: No    Alcohol/week: 0.0 oz  . Drug use: No  . Sexual activity: Not on file  Other Topics Concern  . Not on file  Social History Narrative  . Not on file    Past Surgical History:  Procedure Laterality Date  . ABDOMINAL HYSTERECTOMY  [id number]  . ANTERIOR CERVICAL DECOMP/DISCECTOMY FUSION N/A 01/21/2017   Procedure: C3-4, C4-5 ANTERIOR CERVICAL DISCECTOMY AND FUSION, ALLOGRAFT, PLATE;  Surgeon: [name], MD;  Location: [facility];  Service: Orthopedics;  Laterality: N/A;  . APPENDECTOMY  1996  . BREAST MASS EXCISION  1997   left  .  CHOLECYSTECTOMY  07/15/2011   Procedure: LAPAROSCOPIC CHOLECYSTECTOMY WITH INTRAOPERATIVE CHOLANGIOGRAM;  Surgeon: Shann Medal, MD;  Location: [facility];  Service: General;  Laterality: N/A;  Cholecystectomy with IOC  . TOE FUSION    . TUBAL LIGATION  1980    Family History  Problem Relation Age of Onset  . Cancer Mother        stomach  . Heart disease Father   . Cancer Maternal Aunt        ovarian  . Cancer Paternal Aunt        breast    Allergies as of 03/09/2017      Reactions   Penicillins Rash, Other (See Comments)   PATIENT HAS HAD A PCN REACTION WITH IMMEDIATE RASH, FACIAL/TONGUE/THROAT SWELLING, SOB, OR LIGHTHEADEDNESS WITH HYPOTENSION:  #  #  #  YES  #  #  #   Has patient had a PCN reaction causing  severe rash involving mucus membranes or skin necrosis: No Has patient had a PCN reaction that required hospitalization: No Has patient had a PCN reaction occurring within the last 10 years: No If all of the above answers are "NO", then may proceed with Cephalosporin use.      Medication List        Accurate as of 03/09/17  1:46 PM. Always use your most recent med list.          ALPRAZolam 1 MG tablet Commonly known as:  XANAX Take 0.5 tablets (0.5 mg total) by mouth 3 (three) times daily.   BIOTIN PO Take 2 tablets by mouth daily.   cholestyramine 4 GM/DOSE powder Commonly known as:  QUESTRAN Take 4 g by mouth 2 (two) times daily as needed (FOR DIARRHEA.).   fluticasone 50 MCG/ACT nasal spray Commonly known as:  FLONASE Place 2 sprays into both nostrils daily as needed (FOR ALLERGIES.).   glycopyrrolate 2 MG tablet Commonly known as:  ROBINUL Take 2 mg by mouth daily as needed (for diarrhea.).   levothyroxine 88 MCG tablet Commonly known as:  SYNTHROID, LEVOTHROID Take 88 mcg by mouth 3 (three) times a week. Saturday, Sunday, & Monday   levothyroxine 75 MCG tablet Commonly known as:  SYNTHROID, LEVOTHROID Take 75 mcg by mouth 4 (four) times a week. Tuesday, Wednesday, Thursday,  & Friday   meclizine 25 MG tablet Commonly known as:  ANTIVERT Take 1 tablet (25 mg total) by mouth 3 (three) times daily as needed for dizziness.   pravastatin 40 MG tablet Commonly known as:  PRAVACHOL Take 1 tablet (40 mg total) by mouth daily.   sertraline 50 MG tablet Commonly known as:  ZOLOFT Take 1 tablet (50 mg total) by mouth daily.   VITAMIN D3 PO Take 2 tablets by mouth daily.          Objective:    BP 114/71   Pulse 81   Ht 5\' 2"  (1.575 m)   Wt 137 lb 3.2 oz (62.2 kg)   BMI 25.09 kg/m   Allergies  Allergen Reactions  . Penicillins Rash and Other (See Comments)    PATIENT HAS HAD A PCN REACTION WITH IMMEDIATE RASH, FACIAL/TONGUE/THROAT SWELLING, SOB, OR  LIGHTHEADEDNESS WITH HYPOTENSION:  #  #  #  YES  #  #  #   Has patient had a PCN reaction causing severe rash involving mucus membranes or skin necrosis: No Has patient had a PCN reaction that required hospitalization: No Has patient had a PCN reaction occurring within the last  10 years: No If all of the above answers are "NO", then may proceed with Cephalosporin use.    Wt Readings from Last 3 Encounters:  03/09/17 137 lb 3.2 oz (62.2 kg)  02/14/17 131 lb 9.6 oz (59.7 kg)  01/21/17 134 lb 6 oz (61 kg)    Physical Exam  Constitutional: She is oriented to person, place, and time. She appears well-developed and well-nourished.  HENT:  Head: Normocephalic and atraumatic.  Eyes: Conjunctivae and EOM are normal. Pupils are equal, round, and reactive to light.  Neck: Normal range of motion. Neck supple.  Cardiovascular: Normal rate, regular rhythm, normal heart sounds and intact distal pulses.  Pulmonary/Chest: Effort normal and breath sounds normal. Right breast exhibits no mass, no skin change and no tenderness. Left breast exhibits no mass, no skin change and no tenderness. Breasts are symmetrical.  Abdominal: Soft. Bowel sounds are normal.  Genitourinary: Vagina normal and uterus normal. Rectal exam shows no fissure. No breast swelling, tenderness, discharge or bleeding. There is no tenderness or lesion on the right labia. There is no tenderness or lesion on the left labia. Uterus is not deviated, not enlarged and not tender. Cervix exhibits no motion tenderness, no discharge and no friability. Right adnexum displays no mass, no tenderness and no fullness. Left adnexum displays no mass, no tenderness and no fullness. No tenderness or bleeding in the vagina. No vaginal discharge found.  Neurological: She is alert and oriented to person, place, and time. She has normal reflexes.  Skin: Skin is warm and dry. No rash noted.  Psychiatric: She has a normal mood and affect. Her behavior is normal.  Judgment and thought content normal.    Results for orders placed or performed in visit on 02/14/17  Veritor Flu A/B Waived  Result Value Ref Range   Influenza A Negative Negative   Influenza B Negative Negative      Assessment & Plan:   1. Well adult health check - DG [facility] DEXA; Future  2. Depression, recurrent ([place]) - sertraline (ZOLOFT) 50 MG tablet; Take 1 tablet (50 mg total) by mouth daily.  Dispense: 90 tablet; Refill: 3  3. Osteopenia, unspecified location - DG [facility] DEXA; Future   Continue all other maintenance medications as listed above. Educational handout given for survey  Follow up plan: Return in about 6 months (around 09/06/2017).  [name] PA-C [place] [address]  [place], [place] [address] [phone]   03/09/2017, 1:46 PM

## 2017-03-09 NOTE — Patient Instructions (Signed)
Jaclyn Spates MD Address: [place], [place], [place] [address]  Phone: [phone]   [place]

## 2017-03-15 ENCOUNTER — Other Ambulatory Visit: Payer: Self-pay | Admitting: *Deleted

## 2017-03-15 MED ORDER — LEVOTHYROXINE SODIUM 75 MCG PO TABS: 75.0000 ug | ORAL_TABLET | ORAL | 0 refills | Status: DC

## 2017-03-15 MED ORDER — LEVOTHYROXINE SODIUM 88 MCG PO TABS: 88.0000 ug | ORAL_TABLET | ORAL | 0 refills | Status: DC

## 2017-05-02 ENCOUNTER — Ambulatory Visit: Payer: BLUE CROSS/BLUE SHIELD | Admitting: Physician Assistant

## 2017-05-02 ENCOUNTER — Encounter: Payer: Self-pay | Admitting: Physician Assistant

## 2017-05-02 VITALS — BP 135/82 | HR 90 | Temp 97.8°F | Ht 62.0 in | Wt 138.0 lb

## 2017-05-02 DIAGNOSIS — R5383 Other fatigue: Secondary | ICD-10-CM | POA: Diagnosis not present

## 2017-05-02 DIAGNOSIS — G4710 Hypersomnia, unspecified: Secondary | ICD-10-CM | POA: Diagnosis not present

## 2017-05-02 NOTE — Patient Instructions (Signed)
In a few days you may receive a survey in the mail or online from [employer] regarding your visit with us today. Please take a moment to fill this out. Your feedback is very important to our whole office. It can help us better understand your needs as well as improve your experience and satisfaction. Thank you for taking your time to complete it. We care about you.  Satina Jerrell, PA-C  

## 2017-05-02 NOTE — Progress Notes (Signed)
BP 135/82   Pulse 90   Temp 97.8 F (36.6 C) (Oral)   Ht '5\' 2"'  (1.575 m)   Wt 138 lb (62.6 kg)   BMI 25.24 kg/m    Subjective:    Patient ID: Jaclyn Schmidt, female    DOB: 1950/05/05, 67 y.o.   MRN: [id number]  HPI: Jaclyn Schmidt is a 67 y.o. female presenting on 05/02/2017 for Fatigue  Patient complains of significant fatigue.  She states is been going on for several weeks.  Over the weekend she stated that she is just slept almost continuously.  She denies any fever or chills.  She denies any tick bites.  She denies any rash.  She states that she has had significant amount of stress and difficulty at work due to the workload.  She did have surgery on her neck earlier in the year.  She seems to be doing fairly well from it.  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  Past Medical History:  Diagnosis Date  . Anxiety   . Cholelithiasis   . GERD (gastroesophageal reflux disease)   . History of kidney stones   . Hyperlipidemia   . Hypertension   . Hypothyroidism   . Irritable bowel syndrome   . Pneumonia   . Thyroid disease   . Vitamin D deficiency    Relevant past medical, surgical, family and social history reviewed and updated as indicated. Interim medical history since our last visit reviewed. Allergies and medications reviewed and updated. DATA REVIEWED: CHART IN EPIC  Family History reviewed for pertinent findings.  Review of Systems  Constitutional: Positive for activity change and fatigue. Negative for appetite change, chills, fever and unexpected weight change.  HENT: Negative.   Eyes: Negative.   Respiratory: Negative.  Negative for cough.   Cardiovascular: Negative.  Negative for chest pain.  Gastrointestinal: Negative.  Negative for abdominal pain.  Endocrine: Negative.   Genitourinary: Negative.  Negative for dysuria.  Musculoskeletal: Negative.   Skin: Negative.   Neurological: Negative.  Negative for dizziness, weakness,  light-headedness, numbness and headaches.  Psychiatric/Behavioral: Negative.     Allergies as of 05/02/2017      Reactions   Penicillins Rash, Other (See Comments)   PATIENT HAS HAD A PCN REACTION WITH IMMEDIATE RASH, FACIAL/TONGUE/THROAT SWELLING, SOB, OR LIGHTHEADEDNESS WITH HYPOTENSION:  #  #  #  YES  #  #  #   Has patient had a PCN reaction causing severe rash involving mucus membranes or skin necrosis: No Has patient had a PCN reaction that required hospitalization: No Has patient had a PCN reaction occurring within the last 10 years: No If all of the above answers are "NO", then may proceed with Cephalosporin use.      Medication List        Accurate as of 05/02/17  6:36 PM. Always use your most recent med list.          ALPRAZolam 1 MG tablet Commonly known as:  XANAX Take 0.5 tablets (0.5 mg total) by mouth 3 (three) times daily.   BIOTIN PO Take 2 tablets by mouth daily.   cholestyramine 4 GM/DOSE powder Commonly known as:  QUESTRAN Take 4 g by mouth 2 (two) times daily as needed (FOR DIARRHEA.).   fluticasone 50 MCG/ACT nasal spray Commonly known as:  FLONASE Place 2 sprays into both nostrils daily as needed (FOR ALLERGIES.).   levothyroxine 75 MCG tablet Commonly known  as:  SYNTHROID, LEVOTHROID Take 1 tablet (75 mcg total) by mouth 4 (four) times a week. Tuesday, Wednesday, Thursday,  & Friday   levothyroxine 88 MCG tablet Commonly known as:  SYNTHROID, LEVOTHROID Take 1 tablet (88 mcg total) by mouth 3 (three) times a week. Saturday, Sunday, & Monday   meclizine 25 MG tablet Commonly known as:  ANTIVERT Take 1 tablet (25 mg total) by mouth 3 (three) times daily as needed for dizziness.   pravastatin 40 MG tablet Commonly known as:  PRAVACHOL Take 1 tablet (40 mg total) by mouth daily.   sertraline 50 MG tablet Commonly known as:  ZOLOFT Take 1 tablet (50 mg total) by mouth daily.          Objective:    BP 135/82   Pulse 90   Temp 97.8 F  (36.6 C) (Oral)   Ht '5\' 2"'  (1.575 m)   Wt 138 lb (62.6 kg)   BMI 25.24 kg/m   Allergies  Allergen Reactions  . Penicillins Rash and Other (See Comments)    PATIENT HAS HAD A PCN REACTION WITH IMMEDIATE RASH, FACIAL/TONGUE/THROAT SWELLING, SOB, OR LIGHTHEADEDNESS WITH HYPOTENSION:  #  #  #  YES  #  #  #   Has patient had a PCN reaction causing severe rash involving mucus membranes or skin necrosis: No Has patient had a PCN reaction that required hospitalization: No Has patient had a PCN reaction occurring within the last 10 years: No If all of the above answers are "NO", then may proceed with Cephalosporin use.     Wt Readings from Last 3 Encounters:  05/02/17 138 lb (62.6 kg)  03/09/17 137 lb 3.2 oz (62.2 kg)  02/14/17 131 lb 9.6 oz (59.7 kg)    Physical Exam  Constitutional: She is oriented to person, place, and time. She appears well-developed and well-nourished.  HENT:  Head: Normocephalic and atraumatic.  Eyes: Pupils are equal, round, and reactive to light. Conjunctivae and EOM are normal.  Cardiovascular: Normal rate, regular rhythm, normal heart sounds and intact distal pulses.  Pulmonary/Chest: Effort normal and breath sounds normal.  Abdominal: Soft. Bowel sounds are normal.  Neurological: She is alert and oriented to person, place, and time. She has normal reflexes.  Skin: Skin is warm and dry. No rash noted.  Psychiatric: She has a normal mood and affect. Her behavior is normal. Judgment and thought content normal.        Assessment & Plan:   1. Exhaustion - CMP14+EGFR - Thyroid Panel With TSH - Anemia Profile B - Mononucleosis Test, Qual W/ Reflex  2. Hypersomnolence - CMP14+EGFR - Thyroid Panel With TSH - Anemia Profile B - Mononucleosis Test, Qual W/ Reflex   Continue all other maintenance medications as listed above.  Follow up plan: Return if symptoms worsen or fail to improve.  Educational handout given for [place]  PA-C [place] [address]  [place], [place] [address] [phone]   05/02/2017, 6:36 PM

## 2017-05-03 ENCOUNTER — Other Ambulatory Visit: Payer: Self-pay | Admitting: Physician Assistant

## 2017-05-03 ENCOUNTER — Telehealth: Payer: Self-pay | Admitting: Physician Assistant

## 2017-05-03 LAB — CMP14+EGFR
ALT: 10 IU/L (ref 0–32)
AST: 10 IU/L (ref 0–40)
Albumin/Globulin Ratio: 1.6 (ref 1.2–2.2)
Albumin: 3.9 g/dL (ref 3.6–4.8)
Alkaline Phosphatase: 98 IU/L (ref 39–117)
BUN/Creatinine Ratio: 21 (ref 12–28)
BUN: 13 mg/dL (ref 8–27)
Bilirubin Total: 0.3 mg/dL (ref 0.0–1.2)
CO2: 25 mmol/L (ref 20–29)
Calcium: 9.2 mg/dL (ref 8.7–10.3)
Chloride: 104 mmol/L (ref 96–106)
Creatinine, Ser: 0.62 mg/dL (ref 0.57–1.00)
GFR calc Af Amer: 109 mL/min/{1.73_m2} (ref >59)
GFR calc non Af Amer: 94 mL/min/{1.73_m2} (ref >59)
Globulin, Total: 2.4 g/dL (ref 1.5–4.5)
Glucose: 91 mg/dL (ref 65–99)
Potassium: 3.8 mmol/L (ref 3.5–5.2)
Sodium: 143 mmol/L (ref 134–144)
Total Protein: 6.3 g/dL (ref 6.0–8.5)

## 2017-05-03 LAB — ANEMIA PROFILE B
Basophils Absolute: 0 10*3/uL (ref 0.0–0.2)
Basos: 0 %
EOS (ABSOLUTE): 0.1 10*3/uL (ref 0.0–0.4)
Eos: 2 %
Ferritin: 61 ng/mL (ref 15–150)
Folate: 18.1 ng/mL (ref >3.0)
Hematocrit: 33.4 % — ABNORMAL LOW (ref 34.0–46.6)
Hemoglobin: 11.2 g/dL (ref 11.1–15.9)
Immature Grans (Abs): 0 10*3/uL (ref 0.0–0.1)
Immature Granulocytes: 0 %
Iron Saturation: 6 % — CL (ref 15–55)
Iron: 19 ug/dL — ABNORMAL LOW (ref 27–139)
Lymphocytes Absolute: 2 10*3/uL (ref 0.7–3.1)
Lymphs: 37 %
MCH: 29 pg (ref 26.6–33.0)
MCHC: 33.5 g/dL (ref 31.5–35.7)
MCV: 87 fL (ref 79–97)
Monocytes Absolute: 0.8 10*3/uL (ref 0.1–0.9)
Monocytes: 14 %
Neutrophils Absolute: 2.6 10*3/uL (ref 1.4–7.0)
Neutrophils: 47 %
Platelets: 302 10*3/uL (ref 150–379)
RBC: 3.86 x10E6/uL (ref 3.77–5.28)
RDW: 13 % (ref 12.3–15.4)
Retic Ct Pct: 1.4 % (ref 0.6–2.6)
Total Iron Binding Capacity: 293 ug/dL (ref 250–450)
UIBC: 274 ug/dL (ref 118–369)
Vitamin B-12: 220 pg/mL — ABNORMAL LOW (ref 232–1245)
WBC: 5.5 10*3/uL (ref 3.4–10.8)

## 2017-05-03 LAB — THYROID PANEL WITH TSH
Free Thyroxine Index: 2.1 (ref 1.2–4.9)
T3 Uptake Ratio: 28 % (ref 24–39)
T4, Total: 7.4 ug/dL (ref 4.5–12.0)
TSH: 0.544 u[IU]/mL (ref 0.450–4.500)

## 2017-05-03 LAB — MONO QUAL W/RFLX QN: Mono Qual W/Rflx Qn: NEGATIVE

## 2017-05-03 MED ORDER — FERROUS FUMARATE-FOLIC ACID 324-1 MG PO TABS: 1.0000 | ORAL_TABLET | Freq: Every day | ORAL | 11 refills | Status: DC

## 2017-05-03 NOTE — Telephone Encounter (Signed)
Lm [id number]/16-[id number]

## 2017-05-04 NOTE — Telephone Encounter (Signed)
Aware. 

## 2017-05-05 ENCOUNTER — Ambulatory Visit (INDEPENDENT_AMBULATORY_CARE_PROVIDER_SITE_OTHER): Payer: Self-pay

## 2017-05-05 ENCOUNTER — Ambulatory Visit (INDEPENDENT_AMBULATORY_CARE_PROVIDER_SITE_OTHER): Payer: BLUE CROSS/BLUE SHIELD | Admitting: Orthopaedic Surgery

## 2017-05-05 ENCOUNTER — Encounter (INDEPENDENT_AMBULATORY_CARE_PROVIDER_SITE_OTHER): Payer: Self-pay | Admitting: Orthopaedic Surgery

## 2017-05-05 VITALS — BP 109/63 | HR 93 | Ht 62.0 in | Wt 138.0 lb

## 2017-05-05 DIAGNOSIS — M542 Cervicalgia: Secondary | ICD-10-CM | POA: Diagnosis not present

## 2017-05-09 ENCOUNTER — Encounter (INDEPENDENT_AMBULATORY_CARE_PROVIDER_SITE_OTHER): Payer: Self-pay | Admitting: Orthopaedic Surgery

## 2017-05-09 NOTE — Progress Notes (Signed)
Office Visit Note   Patient: Jaclyn Schmidt           Date of Birth: 04/20/1950           MRN: [id number] Visit Date: 05/05/2017              Requested by: [name], PA-C [address] [place], [place] [address] PCP: [name], PA-C   Assessment & Plan: Visit Diagnoses:  1. Neck pain     Plan: X-rays show satisfactory fusion with progressive graft incorporation and no motion.  She is neurologically intact.  Office follow-up PRN.  Follow-Up Instructions: No follow-ups on file.   Orders:  Orders Placed This Encounter  Procedures  . XR Cervical Spine 2 or 3 views   No orders of the defined types were placed in this encounter.     Procedures: No procedures performed   Clinical Data: No additional findings.   Subjective: Chief Complaint  Patient presents with  . Neck - Pain    HPI 67 year old female returns she is had mild discomfort along the left side of her posterior neck near the trapezium.  She had a friend who had a vertebral or carotid blood clot and had problems with the CVA.  She is concerned since she had neck surgery and the may be she has a similar problem.  No numbness or tingling normal function of her arms good relief of preop neck and arm pain with her two-level cervical fusion at C3-4, C4-5 on 01/21/2017.  Denies dysphagia, dysphonia, no photophobia no visual disturbance no lower extremity weakness, no balance issues normal strength.  Review of Systems 14 point review of systems updated unchanged since her surgery other than as mentioned in HPI.   Objective: Vital Signs: BP 109/63   Pulse 93   Ht 5\' 2"  (1.575 m)   Wt 138 lb (62.6 kg)   BMI 25.24 kg/m   Physical Exam  Constitutional: She is oriented to person, place, and time. She appears well-developed.  HENT:  Head: Normocephalic.  Right Ear: External ear normal.  Left Ear: External ear normal.  Eyes: Pupils are equal, round, and reactive to light.  Neck: No tracheal deviation  present. No thyromegaly present.  Cardiovascular: Normal rate.  Pulmonary/Chest: Effort normal.  Abdominal: Soft.  Neurological: She is alert and oriented to person, place, and time.  Skin: Skin is warm and dry.  Psychiatric: She has a normal mood and affect. Her behavior is normal.    Ortho Exam no brachial plexus tenderness.  Upper extremity reflexes are 2+.  Upper and lower extremity strength are normal no lower extremity clonus normal heel toe gait.  Anterior incision is well-healed.  Thyroids normal palpation.  No carotid bruits.  Specialty Comments:  No specialty comments available.  Imaging:P cervical spine and lateral flexion-extension x-rays demonstrate previous fusion C3-4 C4-5 with allograft and plate.  No motion on flexion-extension.  No prevertebral soft tissue swelling.  Impression: Satisfactory to level cervical fusion with progressive incorporation of 3 months.  No evidence of screw loosening.  No graft subsidence or resorption.    PMFS History: Patient Active Problem List   Diagnosis Date Noted  . Osteopenia 03/09/2017  . Cervical spinal stenosis 01/21/2017  . Anal sphincter incompetence 12/29/2016  . Depression, recurrent ([place]) 12/29/2016  . Other spondylosis with radiculopathy, cervical region 12/02/2016  . Chronic midline thoracic back pain 09/21/2016  . Lumbar pain 09/21/2016  . Gall bladder disease 06/10/2011  . Cholelithiasis 06/03/2011  Past Medical History:  Diagnosis Date  . Anxiety   . Cholelithiasis   . GERD (gastroesophageal reflux disease)   . History of kidney stones   . Hyperlipidemia   . Hypertension   . Hypothyroidism   . Irritable bowel syndrome   . Pneumonia   . Thyroid disease   . Vitamin D deficiency     Family History  Problem Relation Age of Onset  . Cancer Mother        stomach  . Heart disease Father   . Cancer Maternal Aunt        ovarian  . Cancer Paternal Aunt        breast    Past Surgical History:  Procedure  Laterality Date  . ABDOMINAL HYSTERECTOMY  [id number]  . ANTERIOR CERVICAL DECOMP/DISCECTOMY FUSION N/A 01/21/2017   Procedure: C3-4, C4-5 ANTERIOR CERVICAL DISCECTOMY AND FUSION, ALLOGRAFT, PLATE;  Surgeon: [name], MD;  Location: [facility];  Service: Orthopedics;  Laterality: N/A;  . APPENDECTOMY  1996  . BREAST MASS EXCISION  1997   left  . CHOLECYSTECTOMY  07/15/2011   Procedure: LAPAROSCOPIC CHOLECYSTECTOMY WITH INTRAOPERATIVE CHOLANGIOGRAM;  Surgeon: Shann Medal, MD;  Location: [facility];  Service: General;  Laterality: N/A;  Cholecystectomy with IOC  . TOE FUSION    . TUBAL LIGATION  1980   Social History   Occupational History  . Not on file  Tobacco Use  . Smoking status: Current Every Day Smoker    Packs/day: 0.25    Years: 36.00    Pack years: 9.00    Types: Cigarettes    Start date: 09/30/1973    Last attempt to quit: 01/18/2009    Years since quitting: 8.3  . Smokeless tobacco: Never Used  . Tobacco comment: pt started smoking 5-6 cig a day 08/2016  Substance and Sexual Activity  . Alcohol use: No    Alcohol/week: 0.0 oz  . Drug use: No  . Sexual activity: Not on file

## 2017-06-22 ENCOUNTER — Other Ambulatory Visit: Payer: Self-pay | Admitting: *Deleted

## 2017-06-22 DIAGNOSIS — Z0000 Encounter for general adult medical examination without abnormal findings: Secondary | ICD-10-CM

## 2017-06-22 MED ORDER — PRAVASTATIN SODIUM 40 MG PO TABS: 40.0000 mg | ORAL_TABLET | Freq: Every day | ORAL | 3 refills | Status: DC

## 2017-06-23 MED ORDER — PRAVASTATIN SODIUM 40 MG PO TABS: 40.0000 mg | ORAL_TABLET | Freq: Every day | ORAL | 0 refills | Status: DC

## 2017-06-23 NOTE — Telephone Encounter (Signed)
LMOVM refill sent to [facility] and to call for Aug appt

## 2017-06-23 NOTE — Addendum Note (Signed)
Addended by: Antonietta Barcelona D on: 06/23/2017 11:47 AM   Modules accepted: Orders

## 2017-06-30 ENCOUNTER — Ambulatory Visit (INDEPENDENT_AMBULATORY_CARE_PROVIDER_SITE_OTHER): Payer: BLUE CROSS/BLUE SHIELD | Admitting: Orthopaedic Surgery

## 2017-07-18 ENCOUNTER — Encounter: Payer: Self-pay | Admitting: Physician Assistant

## 2017-07-18 ENCOUNTER — Ambulatory Visit (INDEPENDENT_AMBULATORY_CARE_PROVIDER_SITE_OTHER): Payer: Medicare HMO | Admitting: Physician Assistant

## 2017-07-18 VITALS — BP 121/82 | HR 83 | Temp 97.0°F | Ht 62.0 in | Wt 138.0 lb

## 2017-07-18 DIAGNOSIS — E039 Hypothyroidism, unspecified: Secondary | ICD-10-CM

## 2017-07-18 DIAGNOSIS — Z0000 Encounter for general adult medical examination without abnormal findings: Secondary | ICD-10-CM

## 2017-07-18 DIAGNOSIS — M4802 Spinal stenosis, cervical region: Secondary | ICD-10-CM

## 2017-07-18 DIAGNOSIS — R21 Rash and other nonspecific skin eruption: Secondary | ICD-10-CM | POA: Diagnosis not present

## 2017-07-18 DIAGNOSIS — M4722 Other spondylosis with radiculopathy, cervical region: Secondary | ICD-10-CM | POA: Diagnosis not present

## 2017-07-18 DIAGNOSIS — R69 Illness, unspecified: Secondary | ICD-10-CM | POA: Diagnosis not present

## 2017-07-18 DIAGNOSIS — F339 Major depressive disorder, recurrent, unspecified: Secondary | ICD-10-CM | POA: Diagnosis not present

## 2017-07-18 MED ORDER — METHYLPREDNISOLONE ACETATE 80 MG/ML IJ SUSP
40.0000 mg | Freq: Once | INTRAMUSCULAR | Status: AC
  Administered 2017-07-18: 40 mg via INTRAMUSCULAR

## 2017-07-18 MED ORDER — METHYLPREDNISOLONE ACETATE 40 MG/ML IJ SUSP: 40.0000 mg | Freq: Once | INTRAMUSCULAR | Status: DC

## 2017-07-18 NOTE — Progress Notes (Signed)
BP 121/82   Pulse 83   Temp (!) 97 F (36.1 C) (Oral)   Ht '5\' 2"'  (1.575 m)   Wt 138 lb (62.6 kg)   BMI 25.24 kg/m    Subjective:    Patient ID: Jaclyn Schmidt, female    DOB: 03-01-1950, 67 y.o.   MRN: [id number]  HPI: Jaclyn Schmidt is a 67 y.o. female presenting on 07/18/2017 for Hypothyroidism (Medication refill) and Rash (right leg )  This patient comes in for recheck on her chronic medical conditions which do include hypothyroidism, hyperlipidemia, depression, degenerative disc disease.  She has retired and is doing well.  She has had a lot of options of taking care of other family members that have been thick.  She does have some fatigue and is concerned about her thyroid level.  We will draw labs for her annual labs.  Refills will be sent when she requests.  They will be sent to local pharmacy now set up the mail in.  She had a lot of sun exposure last week and got burned on her right lower leg. She  Has had red bumps with severe itching. Denies blisters  Past Medical History:  Diagnosis Date  . Anxiety   . Cholelithiasis   . GERD (gastroesophageal reflux disease)   . History of kidney stones   . Hyperlipidemia   . Hypertension   . Hypothyroidism   . Irritable bowel syndrome   . Pneumonia   . Thyroid disease   . Vitamin D deficiency    Relevant past medical, surgical, family and social history reviewed and updated as indicated. Interim medical history since our last visit reviewed. Allergies and medications reviewed and updated. DATA REVIEWED: CHART IN EPIC  Family History reviewed for pertinent findings.  Review of Systems  Constitutional: Negative.   HENT: Negative.   Eyes: Negative.   Respiratory: Negative.   Gastrointestinal: Negative.   Genitourinary: Negative.   Musculoskeletal: Positive for arthralgias and back pain.  Skin: Positive for color change and rash.    Allergies as of 07/18/2017      Reactions   Penicillins Rash, Other (See Comments)   PATIENT HAS HAD A PCN REACTION WITH IMMEDIATE RASH, FACIAL/TONGUE/THROAT SWELLING, SOB, OR LIGHTHEADEDNESS WITH HYPOTENSION:  #  #  #  YES  #  #  #   Has patient had a PCN reaction causing severe rash involving mucus membranes or skin necrosis: No Has patient had a PCN reaction that required hospitalization: No Has patient had a PCN reaction occurring within the last 10 years: No If all of the above answers are "NO", then may proceed with Cephalosporin use.      Medication List        Accurate as of 07/18/17  1:54 PM. Always use your most recent med list.          ALPRAZolam 1 MG tablet Commonly known as:  XANAX Take 0.5 tablets (0.5 mg total) by mouth 3 (three) times daily.   BIOTIN PO Take 2 tablets by mouth daily.   Ferrous Fumarate-Folic Acid 335-4 MG Tabs Take 1 tablet by mouth daily.   fluticasone 50 MCG/ACT nasal spray Commonly known as:  FLONASE Place 2 sprays into both nostrils daily as needed (FOR ALLERGIES.).   levothyroxine 75 MCG tablet Commonly known as:  SYNTHROID, LEVOTHROID Take 1 tablet (75 mcg total) by mouth 4 (four) times a week. Tuesday, Wednesday, Thursday,  & Friday   levothyroxine 88 MCG tablet Commonly  known as:  SYNTHROID, LEVOTHROID Take 1 tablet (88 mcg total) by mouth 3 (three) times a week. Saturday, Sunday, & Monday   meclizine 25 MG tablet Commonly known as:  ANTIVERT Take 1 tablet (25 mg total) by mouth 3 (three) times daily as needed for dizziness.   pravastatin 40 MG tablet Commonly known as:  PRAVACHOL Take 1 tablet (40 mg total) by mouth daily.   sertraline 50 MG tablet Commonly known as:  ZOLOFT Take 1 tablet (50 mg total) by mouth daily.          Objective:    BP 121/82   Pulse 83   Temp (!) 97 F (36.1 C) (Oral)   Ht '5\' 2"'  (1.575 m)   Wt 138 lb (62.6 kg)   BMI 25.24 kg/m   Allergies  Allergen Reactions  . Penicillins Rash and Other (See Comments)    PATIENT HAS HAD A PCN REACTION WITH IMMEDIATE RASH,  FACIAL/TONGUE/THROAT SWELLING, SOB, OR LIGHTHEADEDNESS WITH HYPOTENSION:  #  #  #  YES  #  #  #   Has patient had a PCN reaction causing severe rash involving mucus membranes or skin necrosis: No Has patient had a PCN reaction that required hospitalization: No Has patient had a PCN reaction occurring within the last 10 years: No If all of the above answers are "NO", then may proceed with Cephalosporin use.     Wt Readings from Last 3 Encounters:  07/18/17 138 lb (62.6 kg)  05/05/17 138 lb (62.6 kg)  05/02/17 138 lb (62.6 kg)    Physical Exam  Constitutional: She is oriented to person, place, and time. She appears well-developed and well-nourished.  HENT:  Head: Normocephalic and atraumatic.  Right Ear: Tympanic membrane, external ear and ear canal normal.  Left Ear: Tympanic membrane, external ear and ear canal normal.  Nose: Nose normal. No rhinorrhea.  Mouth/Throat: Oropharynx is clear and moist and mucous membranes are normal. No oropharyngeal exudate or posterior oropharyngeal erythema.  Eyes: Pupils are equal, round, and reactive to light. Conjunctivae and EOM are normal.  Neck: Normal range of motion. Neck supple.  Cardiovascular: Normal rate, regular rhythm, normal heart sounds and intact distal pulses.  Pulmonary/Chest: Effort normal and breath sounds normal.  Abdominal: Soft. Bowel sounds are normal.  Neurological: She is alert and oriented to person, place, and time. She has normal reflexes.  Skin: Skin is warm and dry. Lesion and rash noted. Rash is papular. There is erythema.     Psychiatric: She has a normal mood and affect. Her behavior is normal. Judgment and thought content normal.    Results for orders placed or performed in visit on 05/02/17  CMP14+EGFR  Result Value Ref Range   Glucose 91 65 - 99 mg/dL   BUN 13 8 - 27 mg/dL   Creatinine, Ser 0.62 0.57 - 1.00 mg/dL   GFR calc non Af Amer 94 >59 mL/min/1.73   GFR calc Af Amer 109 >59 mL/min/1.73    BUN/Creatinine Ratio 21 12 - 28   Sodium 143 134 - 144 mmol/L   Potassium 3.8 3.5 - 5.2 mmol/L   Chloride 104 96 - 106 mmol/L   CO2 25 20 - 29 mmol/L   Calcium 9.2 8.7 - 10.3 mg/dL   Total Protein 6.3 6.0 - 8.5 g/dL   Albumin 3.9 3.6 - 4.8 g/dL   Globulin, Total 2.4 1.5 - 4.5 g/dL   Albumin/Globulin Ratio 1.6 1.2 - 2.2   Bilirubin Total 0.3 0.0 -  1.2 mg/dL   Alkaline Phosphatase 98 39 - 117 IU/L   AST 10 0 - 40 IU/L   ALT 10 0 - 32 IU/L  Thyroid Panel With TSH  Result Value Ref Range   TSH 0.544 0.450 - 4.500 uIU/mL   T4, Total 7.4 4.5 - 12.0 ug/dL   T3 Uptake Ratio 28 24 - 39 %   Free Thyroxine Index 2.1 1.2 - 4.9  Anemia Profile B  Result Value Ref Range   Total Iron Binding Capacity 293 250 - 450 ug/dL   UIBC 274 118 - 369 ug/dL   Iron 19 (L) 27 - 139 ug/dL   Iron Saturation 6 (LL) 15 - 55 %   Ferritin 61 15 - 150 ng/mL   Vitamin B-12 220 (L) 232 - 1,245 pg/mL   Folate 18.1 >3.0 ng/mL   WBC 5.5 3.4 - 10.8 x10E3/uL   RBC 3.86 3.77 - 5.28 x10E6/uL   Hemoglobin 11.2 11.1 - 15.9 g/dL   Hematocrit 33.4 (L) 34.0 - 46.6 %   MCV 87 79 - 97 fL   MCH 29.0 26.6 - 33.0 pg   MCHC 33.5 31.5 - 35.7 g/dL   RDW 13.0 12.3 - 15.4 %   Platelets 302 150 - 379 x10E3/uL   Neutrophils 47 Not Estab. %   Lymphs 37 Not Estab. %   Monocytes 14 Not Estab. %   Eos 2 Not Estab. %   Basos 0 Not Estab. %   Neutrophils Absolute 2.6 1.4 - 7.0 x10E3/uL   Lymphocytes Absolute 2.0 0.7 - 3.1 x10E3/uL   Monocytes Absolute 0.8 0.1 - 0.9 x10E3/uL   EOS (ABSOLUTE) 0.1 0.0 - 0.4 x10E3/uL   Basophils Absolute 0.0 0.0 - 0.2 x10E3/uL   Immature Granulocytes 0 Not Estab. %   Immature Grans (Abs) 0.0 0.0 - 0.1 x10E3/uL   Retic Ct Pct 1.4 0.6 - 2.6 %  Mononucleosis Test, Qual W/ Reflex  Result Value Ref Range   Mono Qual W/Rflx Qn Negative Negative      Assessment & Plan:   1. Other spondylosis with radiculopathy, cervical region - CMP14+EGFR - CBC with Differential/Platelet - Lipid panel - TSH  2.  Depression, recurrent ([place])  3. Cervical spinal stenosis  4. Hypothyroidism, unspecified type - CMP14+EGFR - CBC with Differential/Platelet - Lipid panel - TSH  5. Rash - methylPREDNISolone acetate (DEPO-MEDROL) injection 40 mg  6. Well adult exam - CMP14+EGFR - CBC with Differential/Platelet - Lipid panel    Continue all other maintenance medications as listed above.  Follow up plan: Return in about 6 months (around 01/18/2018) for recheck.  Educational handout given for [place] PA-C [place] [address]  [place], [place] [address] [phone]   07/18/2017, 1:54 PM

## 2017-07-19 ENCOUNTER — Telehealth: Payer: Self-pay | Admitting: Physician Assistant

## 2017-07-19 LAB — CMP14+EGFR
ALT: 12 IU/L (ref 0–32)
AST: 17 IU/L (ref 0–40)
Albumin/Globulin Ratio: 1.9 (ref 1.2–2.2)
Albumin: 4.1 g/dL (ref 3.6–4.8)
Alkaline Phosphatase: 79 IU/L (ref 39–117)
BUN/Creatinine Ratio: 16 (ref 12–28)
BUN: 12 mg/dL (ref 8–27)
Bilirubin Total: 0.3 mg/dL (ref 0.0–1.2)
CO2: 23 mmol/L (ref 20–29)
Calcium: 9.2 mg/dL (ref 8.7–10.3)
Chloride: 109 mmol/L — ABNORMAL HIGH (ref 96–106)
Creatinine, Ser: 0.76 mg/dL (ref 0.57–1.00)
GFR calc Af Amer: 95 mL/min/{1.73_m2} (ref >59)
GFR calc non Af Amer: 82 mL/min/{1.73_m2} (ref >59)
Globulin, Total: 2.2 g/dL (ref 1.5–4.5)
Glucose: 85 mg/dL (ref 65–99)
Potassium: 4.6 mmol/L (ref 3.5–5.2)
Sodium: 144 mmol/L (ref 134–144)
Total Protein: 6.3 g/dL (ref 6.0–8.5)

## 2017-07-19 LAB — CBC WITH DIFFERENTIAL/PLATELET
Basophils Absolute: 0 10*3/uL (ref 0.0–0.2)
Basos: 0 %
EOS (ABSOLUTE): 0.1 10*3/uL (ref 0.0–0.4)
Eos: 3 %
Hematocrit: 37.9 % (ref 34.0–46.6)
Hemoglobin: 12.7 g/dL (ref 11.1–15.9)
Immature Grans (Abs): 0 10*3/uL (ref 0.0–0.1)
Immature Granulocytes: 0 %
Lymphocytes Absolute: 1.8 10*3/uL (ref 0.7–3.1)
Lymphs: 53 %
MCH: 28.9 pg (ref 26.6–33.0)
MCHC: 33.5 g/dL (ref 31.5–35.7)
MCV: 86 fL (ref 79–97)
Monocytes Absolute: 0.5 10*3/uL (ref 0.1–0.9)
Monocytes: 14 %
Neutrophils Absolute: 1 10*3/uL — ABNORMAL LOW (ref 1.4–7.0)
Neutrophils: 30 %
Platelets: 250 10*3/uL (ref 150–450)
RBC: 4.39 x10E6/uL (ref 3.77–5.28)
RDW: 13.5 % (ref 12.3–15.4)
WBC: 3.5 10*3/uL (ref 3.4–10.8)

## 2017-07-19 LAB — LIPID PANEL
Chol/HDL Ratio: 3 ratio (ref 0.0–4.4)
Cholesterol, Total: 143 mg/dL (ref 100–199)
HDL: 47 mg/dL (ref >39)
LDL Calculated: 81 mg/dL (ref 0–99)
Triglycerides: 73 mg/dL (ref 0–149)
VLDL Cholesterol Cal: 15 mg/dL (ref 5–40)

## 2017-07-19 LAB — TSH: TSH: 0.26 u[IU]/mL — ABNORMAL LOW (ref 0.450–4.500)

## 2017-07-19 NOTE — Telephone Encounter (Signed)
Aware of results. 

## 2017-07-28 ENCOUNTER — Encounter (INDEPENDENT_AMBULATORY_CARE_PROVIDER_SITE_OTHER): Payer: Self-pay | Admitting: Orthopaedic Surgery

## 2017-07-28 ENCOUNTER — Ambulatory Visit (INDEPENDENT_AMBULATORY_CARE_PROVIDER_SITE_OTHER): Payer: Medicare HMO | Admitting: Orthopaedic Surgery

## 2017-07-28 VITALS — BP 110/69 | HR 75 | Ht 63.0 in | Wt 138.0 lb

## 2017-07-28 DIAGNOSIS — Z981 Arthrodesis status: Secondary | ICD-10-CM

## 2017-07-28 NOTE — Progress Notes (Signed)
Office Visit Note   Patient: Jaclyn Schmidt           Date of Birth: [birth year]           MRN: [id number] Visit Date: 07/28/2017              Requested by: [name], PA-C [address] [place], [place] [address] PCP: [name], PA-C   Assessment & Plan: Visit Diagnoses:  1. Status post cervical spinal fusion     Plan: Patient is gotten gradual resolution of her symptoms she only has some discomfort with rotation of the left.  Still has a little bit of low back aching but not significant enough for further evaluation at this time.  If she has increased symptoms she can return.  She is happy with the surgical result.  Follow-Up Instructions: Return if symptoms worsen or fail to improve.   Orders:  No orders of the defined types were placed in this encounter.  No orders of the defined types were placed in this encounter.     Procedures: No procedures performed   Clinical Data: No additional findings.   Subjective: Chief Complaint  Patient presents with  . Neck - Follow-up    HPI [age]-year-old female returns post C3-4, C4-5 ACDF on 01/21/2017.  She has no pain when she rotates to the left none when she rotates to the right.  Hand sensation and arm pain is resolved.  She is retired from her [employer] job where she worked for 35 years.  He needs to have some discomfort in her lower back not significant enough to consider further evaluation at this time.  She is active with yard work part-time taking care of her grandchildren and active in the community.  She denies bowel bladder symptoms.  No weakness in lower extremities.  Review of Systems point update unchanged since her surgery last year other than as mentioned in HPI.   Objective: Vital Signs: BP 110/69   Pulse 75   Ht 5\' 3"  (1.6 m)   Wt 138 lb (62.6 kg)   BMI 24.45 kg/m   Physical Exam  Constitutional: She is oriented to person, place, and time. She appears well-developed.  HENT:  Head: Normocephalic.  Right  Ear: External ear normal.  Left Ear: External ear normal.  Eyes: Pupils are equal, round, and reactive to light.  Neck: No tracheal deviation present. No thyromegaly present.  Cardiovascular: Normal rate.  Pulmonary/Chest: Effort normal.  Abdominal: Soft.  Neurological: She is alert and oriented to person, place, and time.  Skin: Skin is warm and dry.  Psychiatric: She has a normal mood and affect. Her behavior is normal.    Ortho Exam well-healed anterior cervical incision.  Mild discomfort with rotation of the left no brachial plexus tenderness right or left upper extremity reflexes are 2+ to 3+ and symmetrical.  No isolated motor weakness no atrophy normal heel toe gait no lower extremity clonus.  Specialty Comments:  No specialty comments available.  Imaging: No results found.   PMFS History: Patient Active Problem List   Diagnosis Date Noted  . Hypothyroidism 07/18/2017  . Osteopenia 03/09/2017  . Anal sphincter incompetence 12/29/2016  . Depression, recurrent ([place]) 12/29/2016  . Chronic midline thoracic back pain 09/21/2016  . Lumbar pain 09/21/2016  . Gall bladder disease 06/10/2011  . Cholelithiasis 06/03/2011   Past Medical History:  Diagnosis Date  . Anxiety   . Cholelithiasis   . GERD (gastroesophageal reflux disease)   .  History of kidney stones   . Hyperlipidemia   . Hypertension   . Hypothyroidism   . Irritable bowel syndrome   . Pneumonia   . Thyroid disease   . Vitamin D deficiency     Family History  Problem Relation Age of Onset  . Cancer Mother        stomach  . Heart disease Father   . Cancer Maternal Aunt        ovarian  . Cancer Paternal Aunt        breast    Past Surgical History:  Procedure Laterality Date  . ABDOMINAL HYSTERECTOMY  [id number]  . ANTERIOR CERVICAL DECOMP/DISCECTOMY FUSION N/A 01/21/2017   Procedure: C3-4, C4-5 ANTERIOR CERVICAL DISCECTOMY AND FUSION, ALLOGRAFT, PLATE;  Surgeon: [name], MD;  Location: [facility];   Service: Orthopedics;  Laterality: N/A;  . APPENDECTOMY  1996  . BREAST MASS EXCISION  1997   left  . CHOLECYSTECTOMY  07/15/2011   Procedure: LAPAROSCOPIC CHOLECYSTECTOMY WITH INTRAOPERATIVE CHOLANGIOGRAM;  Surgeon: Shann Medal, MD;  Location: [facility];  Service: General;  Laterality: N/A;  Cholecystectomy with IOC  . TOE FUSION    . TUBAL LIGATION  1980   Social History   Occupational History  . Not on file  Tobacco Use  . Smoking status: Current Every Day Smoker    Packs/day: 0.25    Years: 36.00    Pack years: 9.00    Types: Cigarettes    Start date: 09/30/1973    Last attempt to quit: 01/18/2009    Years since quitting: 8.5  . Smokeless tobacco: Never Used  . Tobacco comment: pt started smoking 5-6 cig a day 08/2016  Substance and Sexual Activity  . Alcohol use: No    Alcohol/week: 0.0 oz  . Drug use: No  . Sexual activity: Not on file

## 2017-09-05 ENCOUNTER — Encounter: Payer: Self-pay | Admitting: Physician Assistant

## 2017-09-05 ENCOUNTER — Ambulatory Visit (INDEPENDENT_AMBULATORY_CARE_PROVIDER_SITE_OTHER): Payer: Medicare HMO | Admitting: Physician Assistant

## 2017-09-05 VITALS — BP 124/81 | HR 82 | Temp 97.7°F | Ht 63.0 in | Wt 139.8 lb

## 2017-09-05 DIAGNOSIS — R300 Dysuria: Secondary | ICD-10-CM

## 2017-09-05 DIAGNOSIS — E039 Hypothyroidism, unspecified: Secondary | ICD-10-CM

## 2017-09-05 DIAGNOSIS — R42 Dizziness and giddiness: Secondary | ICD-10-CM | POA: Diagnosis not present

## 2017-09-05 LAB — URINALYSIS, COMPLETE
Bilirubin, UA: NEGATIVE
Glucose, UA: NEGATIVE
Ketones, UA: NEGATIVE
Leukocytes, UA: NEGATIVE
Nitrite, UA: NEGATIVE
Protein, UA: NEGATIVE
Specific Gravity, UA: >1.03 — ABNORMAL HIGH (ref 1.005–1.030)
Urobilinogen, Ur: 0.2 mg/dL (ref 0.2–1.0)
pH, UA: 5.5 (ref 5.0–7.5)

## 2017-09-05 LAB — MICROSCOPIC EXAMINATION

## 2017-09-05 NOTE — Progress Notes (Signed)
BP 124/81   Pulse 82   Temp 97.7 F (36.5 C) (Oral)   Ht '5\' 3"'  (1.6 m)   Wt 139 lb 12.8 oz (63.4 kg)   BMI 24.76 kg/m    Subjective:    Patient ID: Jaclyn Schmidt, female    DOB: [birth year], 67 y.o.   MRN: [id number]  HPI: Jaclyn Schmidt is a 67 y.o. female presenting on 09/05/2017 for Passing out  This patient comes in after having dizziness passing out one day last week.  She was helping take care of her sister move her around in her bed.  She is invalid.  She passed out very briefly at that point she has had persistent dizziness especially when she turns her head to the left.  She denies any low blood sugars or low blood pressures.  She has had a history of inner ear problems.  She did have some old meclizine to take she states that it did help.  She also has had some dysuria and burning across her lower abdomen.  She denies any fever or chills.  She also needs her thyroid labs updated today.   Past Medical History:  Diagnosis Date  . Anxiety   . Cholelithiasis   . GERD (gastroesophageal reflux disease)   . History of kidney stones   . Hyperlipidemia   . Hypertension   . Hypothyroidism   . Irritable bowel syndrome   . Pneumonia   . Thyroid disease   . Vitamin D deficiency    Relevant past medical, surgical, family and social history reviewed and updated as indicated. Interim medical history since our last visit reviewed. Allergies and medications reviewed and updated. DATA REVIEWED: CHART IN EPIC  Family History reviewed for pertinent findings.  Review of Systems  Constitutional: Negative.   HENT: Positive for congestion and postnasal drip.   Eyes: Negative.   Respiratory: Negative.   Gastrointestinal: Negative.   Genitourinary: Positive for dysuria. Negative for difficulty urinating.  Neurological: Positive for dizziness. Negative for weakness.    Allergies as of 09/05/2017      Reactions   Penicillins Rash, Other (See Comments)   PATIENT HAS HAD A PCN  REACTION WITH IMMEDIATE RASH, FACIAL/TONGUE/THROAT SWELLING, SOB, OR LIGHTHEADEDNESS WITH HYPOTENSION:  #  #  #  YES  #  #  #   Has patient had a PCN reaction causing severe rash involving mucus membranes or skin necrosis: No Has patient had a PCN reaction that required hospitalization: No Has patient had a PCN reaction occurring within the last 10 years: No If all of the above answers are "NO", then may proceed with Cephalosporin use.      Medication List        Accurate as of 09/05/17  8:59 AM. Always use your most recent med list.          ALPRAZolam 1 MG tablet Commonly known as:  XANAX Take 0.5 tablets (0.5 mg total) by mouth 3 (three) times daily.   BIOTIN PO Take 2 tablets by mouth daily.   Ferrous Fumarate-Folic Acid 782-9 MG Tabs Take 1 tablet by mouth daily.   fluticasone 50 MCG/ACT nasal spray Commonly known as:  FLONASE Place 2 sprays into both nostrils daily as needed (FOR ALLERGIES.).   levothyroxine 75 MCG tablet Commonly known as:  SYNTHROID, LEVOTHROID Take 1 tablet (75 mcg total) by mouth 4 (four) times a week. Tuesday, Wednesday, Thursday,  & Friday   meclizine 25 MG tablet Commonly known  as:  ANTIVERT Take 1 tablet (25 mg total) by mouth 3 (three) times daily as needed for dizziness.   pravastatin 40 MG tablet Commonly known as:  PRAVACHOL Take 1 tablet (40 mg total) by mouth daily.   sertraline 50 MG tablet Commonly known as:  ZOLOFT Take 1 tablet (50 mg total) by mouth daily.          Objective:    BP 124/81   Pulse 82   Temp 97.7 F (36.5 C) (Oral)   Ht '5\' 3"'  (1.6 m)   Wt 139 lb 12.8 oz (63.4 kg)   BMI 24.76 kg/m   Allergies  Allergen Reactions  . Penicillins Rash and Other (See Comments)    PATIENT HAS HAD A PCN REACTION WITH IMMEDIATE RASH, FACIAL/TONGUE/THROAT SWELLING, SOB, OR LIGHTHEADEDNESS WITH HYPOTENSION:  #  #  #  YES  #  #  #   Has patient had a PCN reaction causing severe rash involving mucus membranes or skin necrosis:  No Has patient had a PCN reaction that required hospitalization: No Has patient had a PCN reaction occurring within the last 10 years: No If all of the above answers are "NO", then may proceed with Cephalosporin use.     Wt Readings from Last 3 Encounters:  09/05/17 139 lb 12.8 oz (63.4 kg)  07/28/17 138 lb (62.6 kg)  07/18/17 138 lb (62.6 kg)    Physical Exam  Constitutional: She is oriented to person, place, and time. She appears well-developed and well-nourished.  HENT:  Head: Normocephalic and atraumatic.  Eyes: Pupils are equal, round, and reactive to light. Conjunctivae and EOM are normal.  Cardiovascular: Normal rate, regular rhythm, normal heart sounds and intact distal pulses.  Pulmonary/Chest: Effort normal and breath sounds normal.  Abdominal: Soft. Bowel sounds are normal. There is tenderness in the suprapubic area.    Neurological: She is alert and oriented to person, place, and time. She has normal reflexes.  Positive Rhomberg  Skin: Skin is warm and dry. No rash noted.  Psychiatric: She has a normal mood and affect. Her behavior is normal. Judgment and thought content normal.    Results for orders placed or performed in visit on 07/18/17  CMP14+EGFR  Result Value Ref Range   Glucose 85 65 - 99 mg/dL   BUN 12 8 - 27 mg/dL   Creatinine, Ser 0.76 0.57 - 1.00 mg/dL   GFR calc non Af Amer 82 >59 mL/min/1.73   GFR calc Af Amer 95 >59 mL/min/1.73   BUN/Creatinine Ratio 16 12 - 28   Sodium 144 134 - 144 mmol/L   Potassium 4.6 3.5 - 5.2 mmol/L   Chloride 109 (H) 96 - 106 mmol/L   CO2 23 20 - 29 mmol/L   Calcium 9.2 8.7 - 10.3 mg/dL   Total Protein 6.3 6.0 - 8.5 g/dL   Albumin 4.1 3.6 - 4.8 g/dL   Globulin, Total 2.2 1.5 - 4.5 g/dL   Albumin/Globulin Ratio 1.9 1.2 - 2.2   Bilirubin Total 0.3 0.0 - 1.2 mg/dL   Alkaline Phosphatase 79 39 - 117 IU/L   AST 17 0 - 40 IU/L   ALT 12 0 - 32 IU/L  CBC with Differential/Platelet  Result Value Ref Range   WBC 3.5 3.4 -  10.8 x10E3/uL   RBC 4.39 3.77 - 5.28 x10E6/uL   Hemoglobin 12.7 11.1 - 15.9 g/dL   Hematocrit 37.9 34.0 - 46.6 %   MCV 86 79 - 97 fL   MCH  28.9 26.6 - 33.0 pg   MCHC 33.5 31.5 - 35.7 g/dL   RDW 13.5 12.3 - 15.4 %   Platelets 250 150 - 450 x10E3/uL   Neutrophils 30 Not Estab. %   Lymphs 53 Not Estab. %   Monocytes 14 Not Estab. %   Eos 3 Not Estab. %   Basos 0 Not Estab. %   Neutrophils Absolute 1.0 (L) 1.4 - 7.0 x10E3/uL   Lymphocytes Absolute 1.8 0.7 - 3.1 x10E3/uL   Monocytes Absolute 0.5 0.1 - 0.9 x10E3/uL   EOS (ABSOLUTE) 0.1 0.0 - 0.4 x10E3/uL   Basophils Absolute 0.0 0.0 - 0.2 x10E3/uL   Immature Granulocytes 0 Not Estab. %   Immature Grans (Abs) 0.0 0.0 - 0.1 x10E3/uL   Hematology Comments: Note:   Lipid panel  Result Value Ref Range   Cholesterol, Total 143 100 - 199 mg/dL   Triglycerides 73 0 - 149 mg/dL   HDL 47 >39 mg/dL   VLDL Cholesterol Cal 15 5 - 40 mg/dL   LDL Calculated 81 0 - 99 mg/dL   Chol/HDL Ratio 3.0 0.0 - 4.4 ratio  TSH  Result Value Ref Range   TSH 0.260 (L) 0.450 - 4.500 uIU/mL      Assessment & Plan:   1. Vertigo meclizine 25 mg 1 QID for dizziness Continue flonase  2. Hypothyroidism, unspecified type - TSH  3. Dysuria - Urine Culture - Urinalysis, Complete   Continue all other maintenance medications as listed above.  Follow up plan: No follow-ups on file.  Educational handout given for [place] PA-C [place] [address]  [place], [place] [address] [phone]   09/05/2017, 8:59 AM

## 2017-09-06 ENCOUNTER — Other Ambulatory Visit: Payer: Self-pay | Admitting: Physician Assistant

## 2017-09-06 ENCOUNTER — Other Ambulatory Visit: Payer: Self-pay | Admitting: *Deleted

## 2017-09-06 DIAGNOSIS — Z0000 Encounter for general adult medical examination without abnormal findings: Secondary | ICD-10-CM

## 2017-09-06 LAB — URINE CULTURE: Organism ID, Bacteria: NO GROWTH

## 2017-09-06 LAB — TSH: TSH: 3 u[IU]/mL (ref 0.450–4.500)

## 2017-09-06 MED ORDER — LEVOTHYROXINE SODIUM 75 MCG PO TABS: 75.0000 ug | ORAL_TABLET | Freq: Every day | ORAL | 3 refills | Status: DC

## 2017-09-06 NOTE — Telephone Encounter (Signed)
Patient is requesting a 90 day supply of meclizine

## 2017-09-07 MED ORDER — MECLIZINE HCL 25 MG PO TABS: 25.0000 mg | ORAL_TABLET | Freq: Three times a day (TID) | ORAL | 1 refills | Status: DC | PRN

## 2017-10-05 ENCOUNTER — Other Ambulatory Visit: Payer: Self-pay | Admitting: Physician Assistant

## 2017-10-05 DIAGNOSIS — F339 Major depressive disorder, recurrent, unspecified: Secondary | ICD-10-CM

## 2017-10-05 DIAGNOSIS — Z0000 Encounter for general adult medical examination without abnormal findings: Secondary | ICD-10-CM

## 2017-10-05 MED ORDER — PRAVASTATIN SODIUM 40 MG PO TABS: 40.0000 mg | ORAL_TABLET | Freq: Every day | ORAL | 0 refills | Status: DC

## 2017-10-05 MED ORDER — SERTRALINE HCL 50 MG PO TABS: 50.0000 mg | ORAL_TABLET | Freq: Every day | ORAL | 0 refills | Status: DC

## 2017-10-05 NOTE — Telephone Encounter (Signed)
Patient aware that rx sent to pharmacy. 

## 2017-10-05 NOTE — Telephone Encounter (Signed)
What is the name of the medication? Pravastatin, zolfot  Have you contacted your pharmacy to request a refill? yes  Which pharmacy would you like this sent to? [facility]    Patient notified that their request is being sent to the clinical staff for review and that they should receive a call once it is complete. If they do not receive a call within 24 hours they can check with their pharmacy or our office.

## 2017-11-07 ENCOUNTER — Ambulatory Visit (INDEPENDENT_AMBULATORY_CARE_PROVIDER_SITE_OTHER): Payer: Medicare HMO

## 2017-11-07 DIAGNOSIS — Z23 Encounter for immunization: Secondary | ICD-10-CM | POA: Diagnosis not present

## 2017-11-07 NOTE — Addendum Note (Signed)
Addended by: Torrie Mayers on: 11/07/2017 08:42 AM   Modules accepted: Orders

## 2017-11-14 ENCOUNTER — Ambulatory Visit (INDEPENDENT_AMBULATORY_CARE_PROVIDER_SITE_OTHER): Payer: Medicare HMO | Admitting: Physician Assistant

## 2017-11-14 ENCOUNTER — Encounter: Payer: Self-pay | Admitting: Physician Assistant

## 2017-11-14 VITALS — BP 130/83 | HR 82 | Temp 96.7°F | Ht 63.0 in | Wt 146.0 lb

## 2017-11-14 DIAGNOSIS — B029 Zoster without complications: Secondary | ICD-10-CM | POA: Diagnosis not present

## 2017-11-14 MED ORDER — ACETAMINOPHEN-CODEINE #3 300-30 MG PO TABS: 1.0000 | ORAL_TABLET | ORAL | 0 refills | Status: DC | PRN

## 2017-11-14 MED ORDER — VALACYCLOVIR HCL 1 G PO TABS: 1000.0000 mg | ORAL_TABLET | Freq: Two times a day (BID) | ORAL | 0 refills | Status: DC

## 2017-11-14 NOTE — Patient Instructions (Signed)

## 2017-11-14 NOTE — Progress Notes (Signed)
BP 130/83   Pulse 82   Temp (!) 96.7 F (35.9 C) (Oral)   Ht 5\' 3"  (1.6 m)   Wt 146 lb (66.2 kg)   BMI 25.86 kg/m    Subjective:    Patient ID: Jaclyn Schmidt, female    DOB: 08-Jun-1950, 67 y.o.   MRN: [id number]  HPI: Jaclyn Schmidt is a 67 y.o. female presenting on 11/14/2017 for Herpes Zoster  Jaclyn Schmidt comes in today for a blistery rash of this, and the last 2 days.  Is been on the left side of her face from just behind the ear to inform of the ear across her cheek and up and through the midline and above her eye.  She does not have any vision difficulties.  She denies any fever or chills.  She states that she did get shingles vaccines few years ago at her work.  She will try to get that records sent over to [place].  She denies any fever or chills.  She denies any new products.  The pain involved with the rash has become more severe.  We had a long discussion about the infectious state of this and that she should avoid some of the people in her life right now that are very ill when she is dried up she can return to seeing them.  Past Medical History:  Diagnosis Date  . Anxiety   . Cholelithiasis   . GERD (gastroesophageal reflux disease)   . History of kidney stones   . Hyperlipidemia   . Hypertension   . Hypothyroidism   . Irritable bowel syndrome   . Pneumonia   . Thyroid disease   . Vitamin D deficiency    Relevant past medical, surgical, family and social history reviewed and updated as indicated. Interim medical history since our last visit reviewed. Allergies and medications reviewed and updated. DATA REVIEWED: CHART IN EPIC  Family History reviewed for pertinent findings.  Review of Systems  Constitutional: Negative.   HENT: Negative.   Eyes: Negative.   Respiratory: Negative.   Gastrointestinal: Negative.   Genitourinary: Negative.   Musculoskeletal: Positive for myalgias.  Skin: Positive for color change and rash.    Allergies as of 11/14/2017    Reactions   Penicillins Rash, Other (See Comments)   PATIENT HAS HAD A PCN REACTION WITH IMMEDIATE RASH, FACIAL/TONGUE/THROAT SWELLING, SOB, OR LIGHTHEADEDNESS WITH HYPOTENSION:  #  #  #  YES  #  #  #   Has patient had a PCN reaction causing severe rash involving mucus membranes or skin necrosis: No Has patient had a PCN reaction that required hospitalization: No Has patient had a PCN reaction occurring within the last 10 years: No If all of the above answers are "NO", then may proceed with Cephalosporin use.      Medication List        Accurate as of 11/14/17  9:51 AM. Always use your most recent med list.          acetaminophen-codeine 300-30 MG tablet Commonly known as:  TYLENOL #3 Take 1 tablet by mouth every 4 (four) hours as needed for moderate pain.   ALPRAZolam 1 MG tablet Commonly known as:  XANAX Take 0.5 tablets (0.5 mg total) by mouth 3 (three) times daily.   BIOTIN PO Take 2 tablets by mouth daily.   Ferrous Fumarate-Folic Acid 947-6 MG Tabs Take 1 tablet by mouth daily.   fluticasone 50 MCG/ACT nasal spray Commonly known as:  FLONASE Place 2 sprays into both nostrils daily as needed (FOR ALLERGIES.).   levothyroxine 75 MCG tablet Commonly known as:  SYNTHROID, LEVOTHROID Take 1 tablet (75 mcg total) by mouth daily.   meclizine 25 MG tablet Commonly known as:  ANTIVERT Take 1 tablet (25 mg total) by mouth 3 (three) times daily as needed for dizziness.   pravastatin 40 MG tablet Commonly known as:  PRAVACHOL Take 1 tablet (40 mg total) by mouth daily.   sertraline 50 MG tablet Commonly known as:  ZOLOFT Take 1 tablet (50 mg total) by mouth daily.   valACYclovir 1000 MG tablet Commonly known as:  VALTREX Take 1 tablet (1,000 mg total) by mouth 2 (two) times daily.          Objective:    BP 130/83   Pulse 82   Temp (!) 96.7 F (35.9 C) (Oral)   Ht 5\' 3"  (1.6 m)   Wt 146 lb (66.2 kg)   BMI 25.86 kg/m   Allergies  Allergen Reactions  .  Penicillins Rash and Other (See Comments)    PATIENT HAS HAD A PCN REACTION WITH IMMEDIATE RASH, FACIAL/TONGUE/THROAT SWELLING, SOB, OR LIGHTHEADEDNESS WITH HYPOTENSION:  #  #  #  YES  #  #  #   Has patient had a PCN reaction causing severe rash involving mucus membranes or skin necrosis: No Has patient had a PCN reaction that required hospitalization: No Has patient had a PCN reaction occurring within the last 10 years: No If all of the above answers are "NO", then may proceed with Cephalosporin use.     Wt Readings from Last 3 Encounters:  11/14/17 146 lb (66.2 kg)  09/05/17 139 lb 12.8 oz (63.4 kg)  07/28/17 138 lb (62.6 kg)    Physical Exam  Constitutional: She is oriented to person, place, and time. She appears well-developed and well-nourished.  HENT:  Head: Normocephalic and atraumatic.    Mild blisters on a pink base, no disruption of the blisters  Eyes: Pupils are equal, round, and reactive to light. Conjunctivae and EOM are normal.  Cardiovascular: Normal rate, regular rhythm, normal heart sounds and intact distal pulses.  Pulmonary/Chest: Effort normal and breath sounds normal.  Abdominal: Soft. Bowel sounds are normal.  Neurological: She is alert and oriented to person, place, and time. She has normal reflexes.  Skin: Skin is warm and dry. No rash noted.  Psychiatric: She has a normal mood and affect. Her behavior is normal. Judgment and thought content normal.    Results for orders placed or performed in visit on 09/05/17  Urine Culture  Result Value Ref Range   Urine Culture, Routine Final report    Organism ID, Bacteria No growth   Microscopic Examination  Result Value Ref Range   WBC, UA 0-5 0 - 5 /hpf   RBC, UA 0-2 0 - 2 /hpf   Epithelial Cells (non renal) 0-10 0 - 10 /hpf   Bacteria, UA Few None seen/Few  TSH  Result Value Ref Range   TSH 3.000 0.450 - 4.500 uIU/mL  Urinalysis, Complete  Result Value Ref Range   Specific Gravity, UA >1.030 (H) 1.005 -  1.030   pH, UA 5.5 5.0 - 7.5   Color, UA Yellow Yellow   Appearance Ur Clear Clear   Leukocytes, UA Negative Negative   Protein, UA Negative Negative/Trace   Glucose, UA Negative Negative   Ketones, UA Negative Negative   RBC, UA 1+ (A) Negative   Bilirubin,  UA Negative Negative   Urobilinogen, Ur 0.2 0.2 - 1.0 mg/dL   Nitrite, UA Negative Negative   Microscopic Examination See below:       Assessment & Plan:   1. Herpes zoster without complication - valACYclovir (VALTREX) 1000 MG tablet; Take 1 tablet (1,000 mg total) by mouth 2 (two) times daily.  Dispense: 20 tablet; Refill: 0 - acetaminophen-codeine (TYLENOL #3) 300-30 MG tablet; Take 1 tablet by mouth every 4 (four) hours as needed for moderate pain.  Dispense: 30 tablet; Refill: 0  Call your eye doctor about getting the exam in the next week or so.  Continue all other maintenance medications as listed above.  Follow up plan: Return if symptoms worsen or fail to improve.  Educational handout given for [place] PA-C [place] [address]  [place], [place] [address] [phone]   11/14/2017, 9:51 AM

## 2017-12-05 ENCOUNTER — Encounter: Payer: Self-pay | Admitting: Physician Assistant

## 2017-12-05 ENCOUNTER — Ambulatory Visit (INDEPENDENT_AMBULATORY_CARE_PROVIDER_SITE_OTHER): Payer: Medicare HMO | Admitting: Physician Assistant

## 2017-12-05 VITALS — BP 126/59 | HR 81 | Temp 98.0°F | Ht 63.0 in | Wt 149.2 lb

## 2017-12-05 DIAGNOSIS — J069 Acute upper respiratory infection, unspecified: Secondary | ICD-10-CM | POA: Diagnosis not present

## 2017-12-05 DIAGNOSIS — E039 Hypothyroidism, unspecified: Secondary | ICD-10-CM

## 2017-12-05 DIAGNOSIS — J209 Acute bronchitis, unspecified: Secondary | ICD-10-CM | POA: Diagnosis not present

## 2017-12-05 MED ORDER — AZITHROMYCIN 250 MG PO TABS: ORAL_TABLET | ORAL | 0 refills | Status: DC

## 2017-12-05 NOTE — Progress Notes (Signed)
Established Patient Office Visit  Subjective:  Patient ID: Jaclyn Schmidt, female    DOB: October 04, 1950  Age: 67 y.o. MRN: [id number]  CC:  Chief Complaint  Patient presents with  . Hypothyroidism    3 month follow up  . Cough    HPI DAWSYN ZURN presents for hypothyroidism and regular maintenance medications.  She does need labs performed today.  In the past few days she has gotten sicker and having severe fever and chills.  She has a significant amount of cough and wheezing.  She does have an inhaler at home and will start using it at this time.  Past Medical History:  Diagnosis Date  . Anxiety   . Cholelithiasis   . GERD (gastroesophageal reflux disease)   . History of kidney stones   . Hyperlipidemia   . Hypertension   . Hypothyroidism   . Irritable bowel syndrome   . Pneumonia   . Thyroid disease   . Vitamin D deficiency     Past Surgical History:  Procedure Laterality Date  . ABDOMINAL HYSTERECTOMY  [id number]  . ANTERIOR CERVICAL DECOMP/DISCECTOMY FUSION N/A 01/21/2017   Procedure: C3-4, C4-5 ANTERIOR CERVICAL DISCECTOMY AND FUSION, ALLOGRAFT, PLATE;  Surgeon: [name], MD;  Location: [facility];  Service: Orthopedics;  Laterality: N/A;  . APPENDECTOMY  1996  . BREAST MASS EXCISION  1997   left  . CHOLECYSTECTOMY  07/15/2011   Procedure: LAPAROSCOPIC CHOLECYSTECTOMY WITH INTRAOPERATIVE CHOLANGIOGRAM;  Surgeon: Shann Medal, MD;  Location: [facility];  Service: General;  Laterality: N/A;  Cholecystectomy with IOC  . TOE FUSION    . TUBAL LIGATION  1980    Family History  Problem Relation Age of Onset  . Cancer Mother        stomach  . Heart disease Father   . Cancer Maternal Aunt        ovarian  . Cancer Paternal Aunt        breast    Social History   Socioeconomic History  . Marital status: Divorced    Spouse name: Not on file  . Number of children: Not on file  . Years of education: Not on file  . Highest education level: Not on file  Occupational  History  . Not on file  Social Needs  . Financial resource strain: Not on file  . Food insecurity:    Worry: Not on file    Inability: Not on file  . Transportation needs:    Medical: Not on file    Non-medical: Not on file  Tobacco Use  . Smoking status: Current Every Day Smoker    Packs/day: 0.25    Years: 36.00    Pack years: 9.00    Types: Cigarettes    Start date: 09/30/1973    Last attempt to quit: 01/18/2009    Years since quitting: 8.8  . Smokeless tobacco: Never Used  . Tobacco comment: pt started smoking 5-6 cig a day 08/2016  Substance and Sexual Activity  . Alcohol use: No    Alcohol/week: 0.0 standard drinks  . Drug use: No  . Sexual activity: Not on file  Lifestyle  . Physical activity:    Days per week: Not on file    Minutes per session: Not on file  . Stress: Not on file  Relationships  . Social connections:    Talks on phone: Not on file    Gets together: Not on file    Attends religious service:  Not on file    Active member of club or organization: Not on file    Attends meetings of clubs or organizations: Not on file    Relationship status: Not on file  . Intimate partner violence:    Fear of current or ex partner: Not on file    Emotionally abused: Not on file    Physically abused: Not on file    Forced sexual activity: Not on file  Other Topics Concern  . Not on file  Social History Narrative  . Not on file    Outpatient Medications Prior to Visit  Medication Sig Dispense Refill  . acetaminophen-codeine (TYLENOL #3) 300-30 MG tablet Take 1 tablet by mouth every 4 (four) hours as needed for moderate pain. 30 tablet 0  . ALPRAZolam (XANAX) 1 MG tablet Take 0.5 tablets (0.5 mg total) by mouth 3 (three) times daily. (Patient taking differently: Take 1 mg by mouth 3 (three) times daily as needed (FOR ANXIETY). ) 120 tablet 1  . BIOTIN PO Take 2 tablets by mouth daily.    . Ferrous Fumarate-Folic Acid 664-4 MG TABS Take 1 tablet by mouth daily. 30  each 11  . fluticasone (FLONASE) 50 MCG/ACT nasal spray Place 2 sprays into both nostrils daily as needed (FOR ALLERGIES.).   2  . levothyroxine (SYNTHROID, LEVOTHROID) 75 MCG tablet Take 1 tablet (75 mcg total) by mouth daily. 90 tablet 3  . meclizine (ANTIVERT) 25 MG tablet Take 1 tablet (25 mg total) by mouth 3 (three) times daily as needed for dizziness. 90 tablet 1  . pravastatin (PRAVACHOL) 40 MG tablet Take 1 tablet (40 mg total) by mouth daily. 90 tablet 0  . sertraline (ZOLOFT) 50 MG tablet Take 1 tablet (50 mg total) by mouth daily. 90 tablet 0  . valACYclovir (VALTREX) 1000 MG tablet Take 1 tablet (1,000 mg total) by mouth 2 (two) times daily. 20 tablet 0   No facility-administered medications prior to visit.     Allergies  Allergen Reactions  . Penicillins Rash and Other (See Comments)    PATIENT HAS HAD A PCN REACTION WITH IMMEDIATE RASH, FACIAL/TONGUE/THROAT SWELLING, SOB, OR LIGHTHEADEDNESS WITH HYPOTENSION:  #  #  #  YES  #  #  #   Has patient had a PCN reaction causing severe rash involving mucus membranes or skin necrosis: No Has patient had a PCN reaction that required hospitalization: No Has patient had a PCN reaction occurring within the last 10 years: No If all of the above answers are "NO", then may proceed with Cephalosporin use.     ROS Review of Systems  Constitutional: Positive for chills, fatigue and fever. Negative for activity change and appetite change.  HENT: Positive for congestion, postnasal drip and sore throat.   Eyes: Negative.   Respiratory: Positive for cough and wheezing. Negative for shortness of breath.   Cardiovascular: Negative.  Negative for chest pain, palpitations and leg swelling.  Gastrointestinal: Negative.   Genitourinary: Negative.   Musculoskeletal: Negative.   Skin: Negative.   Neurological: Positive for headaches.      Objective:    Physical Exam  Constitutional: She is oriented to person, place, and time. She appears  well-developed and well-nourished.  HENT:  Head: Normocephalic and atraumatic.  Right Ear: A middle ear effusion is present.  Left Ear: A middle ear effusion is present.  Nose: Mucosal edema present. Right sinus exhibits no frontal sinus tenderness. Left sinus exhibits no frontal sinus tenderness.  Mouth/Throat: Posterior  oropharyngeal erythema present. No oropharyngeal exudate or tonsillar abscesses.  Eyes: Pupils are equal, round, and reactive to light. Conjunctivae and EOM are normal.  Neck: Normal range of motion.  Cardiovascular: Normal rate, regular rhythm, normal heart sounds and intact distal pulses.  Pulmonary/Chest: Effort normal and breath sounds normal.  Abdominal: Soft. Bowel sounds are normal.  Neurological: She is alert and oriented to person, place, and time. She has normal reflexes.  Skin: Skin is warm and dry. No rash noted.  Psychiatric: She has a normal mood and affect. Her behavior is normal. Judgment and thought content normal.  Nursing note and vitals reviewed.   BP (!) 126/59   Pulse 81   Temp 98 F (36.7 C) (Oral)   Ht 5\' 3"  (1.6 m)   Wt 149 lb 3.2 oz (67.7 kg)   BMI 26.43 kg/m  Wt Readings from Last 3 Encounters:  12/05/17 149 lb 3.2 oz (67.7 kg)  11/14/17 146 lb (66.2 kg)  09/05/17 139 lb 12.8 oz (63.4 kg)     Health Maintenance Due  Topic Date Due  . Hepatitis C Screening  [birth year]  . TETANUS/TDAP  09/30/1969  . COLONOSCOPY  05/26/2016    There are no preventive care reminders to display for this patient.  Lab Results  Component Value Date   TSH 3.000 09/05/2017      Assessment & Plan:   Problem List Items Addressed This Visit      Endocrine   Hypothyroidism - Primary    Other Visit Diagnoses    Upper respiratory tract infection, unspecified type          1. Hypothyroidism, unspecified type - Thyroid Panel With TSH  2. Upper respiratory tract infection, unspecified type - azithromycin (ZITHROMAX) 250 MG tablet; Take as  directed  Dispense: 6 tablet; Refill: 0  3. Bronchitis with bronchospasm - azithromycin (ZITHROMAX) 250 MG tablet; Take as directed  Dispense: 6 tablet; Refill: 0   Follow-up: recheck 6 months    [name], PA-C

## 2017-12-05 NOTE — Patient Instructions (Signed)
2 puffs 4 times a day

## 2017-12-06 LAB — THYROID PANEL WITH TSH
Free Thyroxine Index: 2.3 (ref 1.2–4.9)
T3 Uptake Ratio: 31 % (ref 24–39)
T4, Total: 7.4 ug/dL (ref 4.5–12.0)
TSH: 0.829 u[IU]/mL (ref 0.450–4.500)

## 2017-12-14 ENCOUNTER — Telehealth: Payer: Self-pay | Admitting: Physician Assistant

## 2017-12-14 ENCOUNTER — Other Ambulatory Visit: Payer: Self-pay | Admitting: Physician Assistant

## 2017-12-14 MED ORDER — HYDROCODONE-HOMATROPINE 5-1.5 MG/5ML PO SYRP: 5.0000 mL | ORAL_SOLUTION | Freq: Four times a day (QID) | ORAL | 0 refills | Status: DC | PRN

## 2017-12-14 MED ORDER — PREDNISONE 10 MG (48) PO TBPK: ORAL_TABLET | ORAL | 0 refills | Status: DC

## 2017-12-14 MED ORDER — LEVOFLOXACIN 500 MG PO TABS: 500.0000 mg | ORAL_TABLET | Freq: Every day | ORAL | 0 refills | Status: DC

## 2017-12-14 NOTE — Telephone Encounter (Signed)
Detailed message left for patient with recommendations. 

## 2017-12-14 NOTE — Telephone Encounter (Signed)
sent 

## 2017-12-28 ENCOUNTER — Other Ambulatory Visit: Payer: Self-pay | Admitting: Physician Assistant

## 2017-12-28 DIAGNOSIS — Z0000 Encounter for general adult medical examination without abnormal findings: Secondary | ICD-10-CM

## 2017-12-28 DIAGNOSIS — F339 Major depressive disorder, recurrent, unspecified: Secondary | ICD-10-CM

## 2018-01-02 DIAGNOSIS — R69 Illness, unspecified: Secondary | ICD-10-CM | POA: Diagnosis not present

## 2018-01-18 DIAGNOSIS — Z923 Personal history of irradiation: Secondary | ICD-10-CM

## 2018-01-18 HISTORY — DX: Personal history of irradiation: Z92.3

## 2018-01-26 ENCOUNTER — Ambulatory Visit (INDEPENDENT_AMBULATORY_CARE_PROVIDER_SITE_OTHER): Payer: Medicare HMO

## 2018-01-26 ENCOUNTER — Encounter (INDEPENDENT_AMBULATORY_CARE_PROVIDER_SITE_OTHER): Payer: Self-pay | Admitting: Orthopaedic Surgery

## 2018-01-26 ENCOUNTER — Ambulatory Visit (INDEPENDENT_AMBULATORY_CARE_PROVIDER_SITE_OTHER): Payer: Medicare HMO | Admitting: Orthopaedic Surgery

## 2018-01-26 VITALS — BP 115/76 | HR 79 | Ht 63.0 in | Wt 149.0 lb

## 2018-01-26 DIAGNOSIS — M545 Low back pain, unspecified: Secondary | ICD-10-CM

## 2018-01-26 DIAGNOSIS — M25552 Pain in left hip: Secondary | ICD-10-CM

## 2018-01-26 MED ORDER — GABAPENTIN 300 MG PO CAPS: 300.0000 mg | ORAL_CAPSULE | Freq: Every day | ORAL | 1 refills | Status: DC

## 2018-01-26 NOTE — Addendum Note (Signed)
Addended by: Meyer Cory on: 01/26/2018 09:50 AM   Modules accepted: Orders

## 2018-01-26 NOTE — Progress Notes (Signed)
Office Visit Note   Patient: Jaclyn Schmidt           Date of Birth: May 22, 1950           MRN: [id number] Visit Date: 01/26/2018              Requested by: [name], PA-C [address] [place], [place] [address] PCP: [name], PA-C   Assessment & Plan: Visit Diagnoses:  1. Acute left-sided low back pain, unspecified whether sciatica present   2. Pain in left hip     Plan: We will set up for some physical therapy in [place].  Home exercise program.  She is having problems principally at night sleeping we will start some gabapentin 300 mg nightly.  Recheck 4 weeks.  Follow-Up Instructions: Return in about 4 weeks (around 02/23/2018).   Orders:  Orders Placed This Encounter  Procedures  . XR Lumbar Spine 2-3 Views  . XR Pelvis 1-2 Views   Meds ordered this encounter  Medications  . gabapentin (NEURONTIN) 300 MG capsule    Sig: Take 1 capsule (300 mg total) by mouth at bedtime.    Dispense:  30 capsule    Refill:  1      Procedures: No procedures performed   Clinical Data: No additional findings.   Subjective: Chief Complaint  Patient presents with  . Lower Back - Pain  . Left Leg - Pain    HPI 68 year old female returns post cervical fusion states her neck is doing well but she continues to have problems with back pain left leg pain that radiates in her groin down her leg lateral calf to her ankle.  She has foot cramping.  Review of Systems reviewed updated unchanged from last year cervical fusion other than as mentioned above.   Objective: Vital Signs: BP 115/76   Pulse 79   Ht 5\' 3"  (1.6 m)   Wt 149 lb (67.6 kg)   BMI 26.39 kg/m   Physical Exam Constitutional:      Appearance: She is well-developed.  HENT:     Head: Normocephalic.     Right Ear: External ear normal.     Left Ear: External ear normal.  Eyes:     Pupils: Pupils are equal, round, and reactive to light.  Neck:     Thyroid: No thyromegaly.     Trachea: No tracheal  deviation.  Cardiovascular:     Rate and Rhythm: Normal rate.  Pulmonary:     Effort: Pulmonary effort is normal.  Abdominal:     Palpations: Abdomen is soft.  Skin:    General: Skin is warm and dry.  Neurological:     Mental Status: She is alert and oriented to person, place, and time.  Psychiatric:        Behavior: Behavior normal.     Ortho Exam femoral popliteal dorsalis pedis posterior tib pulses are intact.  No pain with hip range of motion.  No hip flexion contracture.  Negative straight leg raising 90 degrees.  Knee and ankle jerk are 1+ and symmetrical.  No clonus.  Anterior tib gastrocsoleus is normal.  No lower extremity atrophy no pedal edema.  She has some tenderness sciatic notch on the left.  No trochanteric bursal tenderness.  Specialty Comments:  No specialty comments available.  Imaging: Xr Lumbar Spine 2-3 Views  Result Date: 01/26/2018 AP lateral lumbar x-rays spot L5-S1 x-ray lateral are obtained and reviewed.  This shows a few millimeters retrolisthesis  at L2-3.  This space narrowing at L1-2 with anterior spurring.  Minimal degenerative facet changes lower lumbar region.  Negative for acute changes. Impression: Degenerative changes L1 to with disc space narrowing L1-L2 3 with retrolisthesis L2-3.  Xr Pelvis 1-2 Views  Result Date: 01/26/2018 AP pelvis x-rays obtained and reviewed.  This shows previous appendectomy clips.  No hip osteoarthritis.  Pelvis is level negative for acute changes. Impression: Normal pelvis x-rays with normal hip joints.    PMFS History: Patient Active Problem List   Diagnosis Date Noted  . Upper respiratory tract infection 12/05/2017  . Vertigo 09/05/2017  . Hypothyroidism 07/18/2017  . Osteopenia 03/09/2017  . Anal sphincter incompetence 12/29/2016  . Depression, recurrent ([place]) 12/29/2016  . Chronic midline thoracic back pain 09/21/2016  . Lumbar pain 09/21/2016  . Gall bladder disease 06/10/2011  . Cholelithiasis 06/03/2011     Past Medical History:  Diagnosis Date  . Anxiety   . Cholelithiasis   . GERD (gastroesophageal reflux disease)   . History of kidney stones   . Hyperlipidemia   . Hypertension   . Hypothyroidism   . Irritable bowel syndrome   . Pneumonia   . Thyroid disease   . Vitamin D deficiency     Family History  Problem Relation Age of Onset  . Cancer Mother        stomach  . Heart disease Father   . Cancer Maternal Aunt        ovarian  . Cancer Paternal Aunt        breast    Past Surgical History:  Procedure Laterality Date  . ABDOMINAL HYSTERECTOMY  [id number]  . ANTERIOR CERVICAL DECOMP/DISCECTOMY FUSION N/A 01/21/2017   Procedure: C3-4, C4-5 ANTERIOR CERVICAL DISCECTOMY AND FUSION, ALLOGRAFT, PLATE;  Surgeon: [name], MD;  Location: [facility];  Service: Orthopedics;  Laterality: N/A;  . APPENDECTOMY  1996  . BREAST MASS EXCISION  1997   left  . CHOLECYSTECTOMY  07/15/2011   Procedure: LAPAROSCOPIC CHOLECYSTECTOMY WITH INTRAOPERATIVE CHOLANGIOGRAM;  Surgeon: Shann Medal, MD;  Location: [facility];  Service: General;  Laterality: N/A;  Cholecystectomy with IOC  . TOE FUSION    . TUBAL LIGATION  1980   Social History   Occupational History  . Not on file  Tobacco Use  . Smoking status: Current Every Day Smoker    Packs/day: 0.25    Years: 36.00    Pack years: 9.00    Types: Cigarettes    Start date: 09/30/1973    Last attempt to quit: 01/18/2009    Years since quitting: 9.0  . Smokeless tobacco: Never Used  . Tobacco comment: pt started smoking 5-6 cig a day 08/2016  Substance and Sexual Activity  . Alcohol use: No    Alcohol/week: 0.0 standard drinks  . Drug use: No  . Sexual activity: Not on file

## 2018-01-31 ENCOUNTER — Ambulatory Visit (INDEPENDENT_AMBULATORY_CARE_PROVIDER_SITE_OTHER): Payer: Medicare HMO

## 2018-01-31 ENCOUNTER — Other Ambulatory Visit: Payer: Self-pay | Admitting: Family

## 2018-01-31 ENCOUNTER — Ambulatory Visit (INDEPENDENT_AMBULATORY_CARE_PROVIDER_SITE_OTHER): Payer: Medicare HMO | Admitting: Family

## 2018-01-31 ENCOUNTER — Telehealth: Payer: Self-pay | Admitting: Physician Assistant

## 2018-01-31 ENCOUNTER — Encounter: Payer: Self-pay | Admitting: Family

## 2018-01-31 VITALS — BP 121/78 | HR 91 | Temp 97.3°F | Ht 63.0 in | Wt 150.0 lb

## 2018-01-31 DIAGNOSIS — R6889 Other general symptoms and signs: Secondary | ICD-10-CM

## 2018-01-31 DIAGNOSIS — R0989 Other specified symptoms and signs involving the circulatory and respiratory systems: Secondary | ICD-10-CM | POA: Diagnosis not present

## 2018-01-31 DIAGNOSIS — R05 Cough: Secondary | ICD-10-CM

## 2018-01-31 DIAGNOSIS — R059 Cough, unspecified: Secondary | ICD-10-CM

## 2018-01-31 DIAGNOSIS — J208 Acute bronchitis due to other specified organisms: Secondary | ICD-10-CM | POA: Diagnosis not present

## 2018-01-31 DIAGNOSIS — B9689 Other specified bacterial agents as the cause of diseases classified elsewhere: Secondary | ICD-10-CM | POA: Diagnosis not present

## 2018-01-31 LAB — VERITOR FLU A/B WAIVED
Influenza A: NEGATIVE
Influenza B: NEGATIVE

## 2018-01-31 MED ORDER — DOXYCYCLINE HYCLATE 100 MG PO TABS: 100.0000 mg | ORAL_TABLET | Freq: Two times a day (BID) | ORAL | 0 refills | Status: DC

## 2018-01-31 MED ORDER — BUDESONIDE-FORMOTEROL FUMARATE 80-4.5 MCG/ACT IN AERO: 2.0000 | INHALATION_SPRAY | Freq: Two times a day (BID) | RESPIRATORY_TRACT | 3 refills | Status: DC

## 2018-01-31 NOTE — Telephone Encounter (Signed)
PT was seen today and the inhaler that was sent in was to expensive and she is wanting to know if something else can be done because she can't afford the inhaler   Pharmacy: [facility]

## 2018-01-31 NOTE — Patient Instructions (Signed)

## 2018-01-31 NOTE — Progress Notes (Signed)
Subjective:    Patient ID: Jaclyn Schmidt, female    DOB: January 19, 1950, 68 y.o.   MRN: [id number]  Chief Complaint  Patient presents with  . Cough    chest congestion   Pt presents to the office today with recurrent cough. She states since November she has had a cough, because she is sitting with an elderly man and the house smells strong of urine. She is scared the strong anomia smell has caused her to have bronchitis.   However, starting yesterday she states she has started feeling worse with fatigue and weakness.   Cough  This is a new problem. The current episode started 1 to 4 weeks ago. The problem has been gradually worsening. The problem occurs every few minutes. The cough is productive of sputum. Associated symptoms include chills, headaches, nasal congestion, shortness of breath and wheezing. Pertinent negatives include no ear congestion, ear pain, fever, myalgias, rhinorrhea or sore throat. The symptoms are aggravated by lying down. Risk factors for lung disease include smoking/tobacco exposure. She has tried rest and OTC cough suppressant for the symptoms. The treatment provided mild relief. There is no history of asthma or COPD.      Review of Systems  Constitutional: Positive for chills. Negative for fever.  HENT: Negative for ear pain, rhinorrhea and sore throat.   Respiratory: Positive for cough, shortness of breath and wheezing.   Musculoskeletal: Negative for myalgias.  Neurological: Positive for headaches.  All other systems reviewed and are negative.      Objective:   Physical Exam Vitals signs reviewed.  Constitutional:      General: She is not in acute distress.    Appearance: She is well-developed. She is ill-appearing.  HENT:     Head: Normocephalic and atraumatic.  Eyes:     Pupils: Pupils are equal, round, and reactive to light.  Neck:     Musculoskeletal: Normal range of motion and neck supple.     Thyroid: No thyromegaly.  Cardiovascular:     Rate  and Rhythm: Normal rate and regular rhythm.     Heart sounds: Normal heart sounds. No murmur.  Pulmonary:     Effort: Pulmonary effort is normal. No respiratory distress.     Breath sounds: Normal breath sounds. No wheezing.     Comments: Intermittent productive cough Abdominal:     General: Bowel sounds are normal. There is no distension.     Palpations: Abdomen is soft.     Tenderness: There is no abdominal tenderness.  Musculoskeletal: Normal range of motion.        General: No tenderness.  Skin:    General: Skin is warm and dry.  Neurological:     Mental Status: She is alert and oriented to person, place, and time.     Cranial Nerves: No cranial nerve deficit.     Deep Tendon Reflexes: Reflexes are normal and symmetric.  Psychiatric:        Behavior: Behavior normal.        Thought Content: Thought content normal.        Judgment: Judgment normal.       BP 121/78   Pulse 91   Temp (!) 97.3 F (36.3 C) (Oral)   Ht 5\' 3"  (1.6 m)   Wt 150 lb (68 kg)   BMI 26.57 kg/m      Assessment & Plan:  Jaclyn Schmidt comes in today with chief complaint of Cough (chest congestion)   Diagnosis and orders  addressed:  1. Cough - DG Chest 2 View; Future  2. Flu-like symptoms - Veritor Flu A/B Waived  3. Acute bacterial bronchitis - Take meds as prescribed - Use a cool mist humidifier  -Use saline nose sprays frequently -Force fluids -For any cough or congestion  Use plain Mucinex- regular strength or max strength is fine -For fever or aces or pains- take tylenol or ibuprofen. -Throat lozenges if help -RTO in 1 month with PCP for follow up - doxycycline (VIBRA-TABS) 100 MG tablet; Take 1 tablet (100 mg total) by mouth 2 (two) times daily.  Dispense: 20 tablet; Refill: 0 - budesonide-formoterol (SYMBICORT) 80-4.5 MCG/ACT inhaler; Inhale 2 puffs into the lungs 2 (two) times daily.  Dispense: 1 Inhaler; Refill: Young, FNP

## 2018-01-31 NOTE — Telephone Encounter (Signed)
Did she get the doxycycline? We will hold off on the inhaler at this time as this is more than likely being caused by pneumonia

## 2018-01-31 NOTE — Telephone Encounter (Signed)
Pt notified of recommendation Verbalizes understanding 

## 2018-02-01 ENCOUNTER — Other Ambulatory Visit: Payer: Self-pay

## 2018-02-01 ENCOUNTER — Encounter: Payer: Self-pay | Admitting: Physical Therapy

## 2018-02-01 ENCOUNTER — Ambulatory Visit: Payer: Medicare HMO | Admitting: *Deleted

## 2018-02-01 ENCOUNTER — Ambulatory Visit: Payer: Medicare HMO | Attending: Orthopaedic Surgery | Admitting: Physical Therapy

## 2018-02-01 DIAGNOSIS — M545 Low back pain, unspecified: Secondary | ICD-10-CM

## 2018-02-01 NOTE — Therapy (Signed)
[facility] [place], [place], [address] Phone: [phone]   Fax:  [phone]  Physical Therapy Evaluation/Discharge  Patient Details  Name: Jaclyn Schmidt MRN: [id number] Date of Birth: 1950/09/22 Referring Provider (PT): Rodell Perna, MD   Encounter Date: 02/01/2018  PT End of Session - 02/01/18 1454    Visit Number  1    Number of Visits  1    Date for PT Re-Evaluation  02/01/18    PT Start Time  1324    PT Stop Time  1430    PT Time Calculation (min)  42 min    Activity Tolerance  Patient tolerated treatment well    Behavior During Therapy  [facility] for tasks assessed/performed       Past Medical History:  Diagnosis Date  . Anxiety   . Cholelithiasis   . GERD (gastroesophageal reflux disease)   . History of kidney stones   . Hyperlipidemia   . Hypertension   . Hypothyroidism   . Irritable bowel syndrome   . Pneumonia   . Thyroid disease   . Vitamin D deficiency     Past Surgical History:  Procedure Laterality Date  . ABDOMINAL HYSTERECTOMY  [id number]  . ANTERIOR CERVICAL DECOMP/DISCECTOMY FUSION N/A 01/21/2017   Procedure: C3-4, C4-5 ANTERIOR CERVICAL DISCECTOMY AND FUSION, ALLOGRAFT, PLATE;  Surgeon: [name], MD;  Location: [facility];  Service: Orthopedics;  Laterality: N/A;  . APPENDECTOMY  1996  . BREAST MASS EXCISION  1997   left  . CHOLECYSTECTOMY  07/15/2011   Procedure: LAPAROSCOPIC CHOLECYSTECTOMY WITH INTRAOPERATIVE CHOLANGIOGRAM;  Surgeon: Shann Medal, MD;  Location: [facility];  Service: General;  Laterality: N/A;  Cholecystectomy with IOC  . TOE FUSION    . TUBAL LIGATION  1980    There were no vitals filed for this visit.   Subjective Assessment - 02/01/18 1450    Subjective  Patient arrives to physical therapy with reports of low back pain and radiating neurological symptoms to left foot that began in about December 2019. Patient reports she has difficulties are requires increased time to  perform ADLs and home activities due to pain. Patient reports neurological symptoms and pain wakes her up at night. Patient's pain at worst is 9/10 and pain at best is 3/10. Patient's goals are to learn exercises to perform at home and decrease pain.     Limitations  Sitting;Standing;Walking;House hold activities    Patient Stated Goals  home exercise program, reduce pain    Currently in Pain?  Yes    Pain Score  6     Pain Location  Back    Pain Orientation  Lower;Left    Pain Descriptors / Indicators  Shooting;Sore;Aching;Pins and needles    Pain Type  Acute pain    Pain Radiating Towards  left foot    Pain Onset  More than a month ago    Pain Frequency  Constant    Aggravating Factors   moving    Pain Relieving Factors  tylenol    Effect of Pain on Daily Activities  pain with everything I do         [facility] PT Assessment - 02/01/18 0001      Assessment   Medical Diagnosis  Low back pain    Referring Provider (PT)  Rodell Perna, MD    Onset Date/Surgical Date  --   December 2019   Next MD Visit  February    Prior Therapy  no  Precautions   Precautions  None      Restrictions   Weight Bearing Restrictions  No      Balance Screen   Has the patient fallen in the past 6 months  No    Has the patient had a decrease in activity level because of a fear of falling?   No    Is the patient reluctant to leave their home because of a fear of falling?   No      Home Social worker  Private residence    Living Arrangements  Alone      ROM / Strength   AROM / PROM / Strength  Strength      Strength   Strength Assessment Site  Hip;Knee    Right/Left Hip  Right;Left    Right Hip Flexion  4-/5    Left Hip Flexion  4-/5    Right/Left Knee  Right;Left    Right Knee Flexion  4-/5    Right Knee Extension  4/5    Left Knee Flexion  4-/5    Left Knee Extension  4/5      Palpation   Palpation comment  very tender to lower lumbar paraspinals, bilateral SIJ, left  gluteal,      Special Tests    Special Tests  Lumbar    Lumbar Tests  Slump Test      Slump test   Findings  Positive    Side  Left      Transfers   Comments  slow to transfer due to pain      Ambulation/Gait   Gait Pattern  Step-through pattern;Antalgic                Objective measurements completed on examination: See above findings.              PT Education - 02/01/18 1501    Education Details  draw ins, LTR, single knee to chest, bridges, clam shells, log rolling technique    Person(s) Educated  Patient    Methods  Explanation;Handout    Comprehension  Verbalized understanding;Returned demonstration          PT Long Term Goals - 02/01/18 1456      PT LONG TERM GOAL #1   Title  independence with HEP    Time  1    Period  Days    Status  New             Plan - 02/01/18 1455    Clinical Impression Statement  Patient is a 68 year old female who presents to physical therapy with low back pain, left sided neurological symptoms, and decreased LE strength. Patient (+) with left slump test reproducing symptoms. Patient noted with tenderness to palpation along bilateral SIJ, lower lumbar paraspinals and L glute. Per referral, patient provided with HEP consisting of low level strengthening and stretching as well as proper log rolling technique. Patient educated on importance of compliance of HEP to which patient reported understanding. Patient instructed to call she has any further questions. Patient reported understanding.     Clinical Presentation  Stable    Clinical Decision Making  Low    Rehab Potential  Good    PT Frequency  One time visit    PT Treatment/Interventions  Therapeutic exercise;Patient/family education    PT Next Visit Plan  DC    PT Home Exercise Plan  see patient education section    Consulted and Agree with  Plan of Care  Patient       Patient will benefit from skilled therapeutic intervention in order to improve the  following deficits and impairments:  Pain, Decreased activity tolerance, Decreased endurance, Decreased strength  Visit Diagnosis: Acute low back pain, unspecified back pain laterality, unspecified whether sciatica present - Plan: PT plan of care cert/re-cert     Problem List Patient Active Problem List   Diagnosis Date Noted  . Upper respiratory tract infection 12/05/2017  . Vertigo 09/05/2017  . Hypothyroidism 07/18/2017  . Osteopenia 03/09/2017  . Anal sphincter incompetence 12/29/2016  . Depression, recurrent ([place]) 12/29/2016  . Chronic midline thoracic back pain 09/21/2016  . Lumbar pain 09/21/2016  . Gall bladder disease 06/10/2011  . Cholelithiasis 06/03/2011   Gabriela Eves, PT, DPT 02/01/2018, 3:01 PM  [facility] [address] [place], [place], [address] Phone: [phone]   Fax:  [phone]  Name: Jaclyn Schmidt MRN: [id number] Date of Birth: Oct 25, 1950   PHYSICAL THERAPY DISCHARGE SUMMARY  Visits from Start of Care: 1  Current functional level related to goals / functional outcomes: eval only   Remaining deficits: eval only   Education / Equipment: HEP red theraband Plan: Patient agrees to discharge.  Patient goals were not met. Patient is being discharged due to the physician's request.  ?????   Establish HEP only per referral.

## 2018-02-07 ENCOUNTER — Ambulatory Visit (INDEPENDENT_AMBULATORY_CARE_PROVIDER_SITE_OTHER): Payer: Medicare HMO | Admitting: *Deleted

## 2018-02-07 ENCOUNTER — Encounter: Payer: Self-pay | Admitting: *Deleted

## 2018-02-07 VITALS — BP 118/75 | HR 80 | Ht 63.0 in | Wt 150.0 lb

## 2018-02-07 DIAGNOSIS — Z0000 Encounter for general adult medical examination without abnormal findings: Secondary | ICD-10-CM | POA: Diagnosis not present

## 2018-02-07 DIAGNOSIS — Z1211 Encounter for screening for malignant neoplasm of colon: Secondary | ICD-10-CM

## 2018-02-07 NOTE — Patient Instructions (Addendum)
Please work on your goal:  Goals    . Exercise 3x per week (30 min per time)      Remember to go for your mammogram in March.    Call Dr. Oletta Lamas office to schedule your colonoscopy at [phone].   Please follow up with Jaclyn Nearing, PA as scheduled.  Thank you for coming in for your Annual Wellness Visit today!!   Preventive Care 65 Years and Older, Female Preventive care refers to lifestyle choices and visits with your health care provider that can promote health and wellness. What does preventive care include?  A yearly physical exam. This is also called an annual well check.  Dental exams once or twice a year.  Routine eye exams. Ask your health care provider how often you should have your eyes checked.  Personal lifestyle choices, including: ? Daily care of your teeth and gums. ? Regular physical activity. ? Eating a healthy diet. ? Avoiding tobacco and drug use. ? Limiting alcohol use. ? Practicing safe sex. ? Taking low-dose aspirin every day. ? Taking vitamin and mineral supplements as recommended by your health care provider. What happens during an annual well check? The services and screenings done by your health care provider during your annual well check will depend on your age, overall health, lifestyle risk factors, and family history of disease. Counseling Your health care provider may ask you questions about your:  Alcohol use.  Tobacco use.  Drug use.  Emotional well-being.  Home and relationship well-being.  Sexual activity.  Eating habits.  History of falls.  Memory and ability to understand (cognition).  Work and work Statistician.  Reproductive health.  Screening You may have the following tests or measurements:  Height, weight, and BMI.  Blood pressure.  Lipid and cholesterol levels. These may be checked every 5 years, or more frequently if you are over 22 years old.  Skin check.  Lung cancer screening. You may have this  screening every year starting at age 78 if you have a 30-pack-year history of smoking and currently smoke or have quit within the past 15 years.  Colorectal cancer screening. All adults should have this screening starting at age 15 and continuing until age 64. You will have tests every 1-10 years, depending on your results and the type of screening test. People at increased risk should start screening at an earlier age. Screening tests may include: ? Guaiac-based fecal occult blood testing. ? Fecal immunochemical test (FIT). ? Stool DNA test. ? Virtual colonoscopy. ? Sigmoidoscopy. During this test, a flexible tube with a tiny camera (sigmoidoscope) is used to examine your rectum and lower colon. The sigmoidoscope is inserted through your anus into your rectum and lower colon. ? Colonoscopy. During this test, a long, thin, flexible tube with a tiny camera (colonoscope) is used to examine your entire colon and rectum.  Hepatitis C blood test.  Hepatitis B blood test.  Sexually transmitted disease (STD) testing.  Diabetes screening. This is done by checking your blood sugar (glucose) after you have not eaten for a while (fasting). You may have this done every 1-3 years.  Bone density scan. This is done to screen for osteoporosis. You may have this done starting at age 14.  Mammogram. This may be done every 1-2 years. Talk to your health care provider about how often you should have regular mammograms. Talk with your health care provider about your test results, treatment options, and if necessary, the need for more tests. Vaccines Your  health care provider may recommend certain vaccines, such as:  Influenza vaccine. This is recommended every year.  Tetanus, diphtheria, and acellular pertussis (Tdap, Td) vaccine. You may need a Td booster every 10 years.  Varicella vaccine. You may need this if you have not been vaccinated.  Zoster vaccine. You may need this after age 1.  Measles,  mumps, and rubella (MMR) vaccine. You may need at least one dose of MMR if you were born in 1957 or later. You may also need a second dose.  Pneumococcal 13-valent conjugate (PCV13) vaccine. One dose is recommended after age 31.  Pneumococcal polysaccharide (PPSV23) vaccine. One dose is recommended after age 81.  Meningococcal vaccine. You may need this if you have certain conditions.  Hepatitis A vaccine. You may need this if you have certain conditions or if you travel or work in places where you may be exposed to hepatitis A.  Hepatitis B vaccine. You may need this if you have certain conditions or if you travel or work in places where you may be exposed to hepatitis B.  Haemophilus influenzae type b (Hib) vaccine. You may need this if you have certain conditions. Talk to your health care provider about which screenings and vaccines you need and how often you need them. This information is not intended to replace advice given to you by your health care provider. Make sure you discuss any questions you have with your health care provider. Document Released: 01/31/2015 Document Revised: 02/24/2017 Document Reviewed: 11/05/2014 Elsevier Interactive Patient Education  2019 [employer].

## 2018-02-07 NOTE — Progress Notes (Addendum)
Subjective:   Jaclyn Schmidt is a 68 y.o. female who presents for an Initial Medicare Annual Wellness Visit.  Jaclyn Schmidt retired from [employer] within the last year.  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  She enjoys going to church, traveling, dancing, taking care of her grandchildren and animals at her home.   She lives alone, but has 2 horses, 2 dogs and a cat.  Jaclyn Schmidt has 1 son and 3 grandchildren.  She picks up her grandchildren 2-3 days per week from school.  She feels her health has declined slightly in the past year because she has been having problems with lower back pain with sciatica and left hip pain.  She is being followed by orthopedics for her back and leg pain.  She reports no ER visits, hospitalizations, or surgeries in the past year.     Review of Systems     Musculoskeletal - positive for left leg pain Respiratory - positive for cough  Cardiac Risk Factors include: advanced age (>20men, >59 women)     Objective:    Today's Vitals   02/07/18 1504  BP: 118/75  Pulse: 80  Weight: 150 lb (68 kg)  Height: 5\' 3"  (1.6 m)  PainSc: 4   PainLoc: Leg   Body mass index is 26.57 kg/m.  Advanced Directives 02/07/2018 02/01/2018 01/12/2017 07/07/2011  Does Patient Have a Medical Advance Directive? No No No Patient has advance directive, copy not in chart  Type of Advance Directive - - - [place];Living will  Would patient like information on creating a medical advance directive? No - Patient declined - No - Patient declined -  Pre-existing out of facility DNR order (yellow form or pink MOST form) - - - No    Current Medications (verified) Outpatient Encounter Medications as of 02/07/2018  Medication Sig  . ALPRAZolam (XANAX) 1 MG tablet Take 0.5 tablets (0.5 mg total) by mouth 3 (three) times daily. (Patient taking differently: Take 1 mg by mouth 3 (three) times daily as needed (FOR ANXIETY). )  . BIOTIN PO Take 2 tablets  by mouth daily.  . Cholecalciferol (VITAMIN D3 PO) Take 4,000 Int'l Units by mouth daily.  Marland Kitchen doxycycline (VIBRA-TABS) 100 MG tablet Take 1 tablet (100 mg total) by mouth 2 (two) times daily.  . fluticasone (FLONASE) 50 MCG/ACT nasal spray Place 2 sprays into both nostrils daily as needed (FOR ALLERGIES.).   Marland Kitchen gabapentin (NEURONTIN) 300 MG capsule Take 1 capsule (300 mg total) by mouth at bedtime.  Marland Kitchen levothyroxine (SYNTHROID, LEVOTHROID) 75 MCG tablet Take 1 tablet (75 mcg total) by mouth daily.  . pravastatin (PRAVACHOL) 40 MG tablet TAKE 1 TABLET BY MOUTH EVERY DAY  . sertraline (ZOLOFT) 50 MG tablet TAKE 1 TABLET BY MOUTH EVERY DAY  . acetaminophen-codeine (TYLENOL #3) 300-30 MG tablet Take 1 tablet by mouth every 4 (four) hours as needed for moderate pain. (Patient not taking: Reported on 02/07/2018)  . Ferrous Fumarate-Folic Acid 979-4 MG TABS Take 1 tablet by mouth daily. (Patient not taking: Reported on 02/07/2018)  . meclizine (ANTIVERT) 25 MG tablet Take 1 tablet (25 mg total) by mouth 3 (three) times daily as needed for dizziness. (Patient not taking: Reported on 02/07/2018)  . valACYclovir (VALTREX) 1000 MG tablet Take 1 tablet (1,000 mg total) by mouth 2 (two) times daily. (Patient not taking: Reported on 02/07/2018)   No facility-administered encounter medications on file as of 02/07/2018.  Allergies (verified) Penicillins   History: Past Medical History:  Diagnosis Date  . Anxiety   . Cholelithiasis   . GERD (gastroesophageal reflux disease)   . History of kidney stones   . Hyperlipidemia   . Hypertension   . Hypothyroidism   . Irritable bowel syndrome   . Pneumonia   . Thyroid disease   . Vitamin D deficiency    Past Surgical History:  Procedure Laterality Date  . ABDOMINAL HYSTERECTOMY  [id number]  . ANTERIOR CERVICAL DECOMP/DISCECTOMY FUSION N/A 01/21/2017   Procedure: C3-4, C4-5 ANTERIOR CERVICAL DISCECTOMY AND FUSION, ALLOGRAFT, PLATE;  Surgeon: [name], MD;   Location: [facility];  Service: Orthopedics;  Laterality: N/A;  . APPENDECTOMY  1996  . BREAST MASS EXCISION  1997   left  . CHOLECYSTECTOMY  07/15/2011   Procedure: LAPAROSCOPIC CHOLECYSTECTOMY WITH INTRAOPERATIVE CHOLANGIOGRAM;  Surgeon: Shann Medal, MD;  Location: [facility];  Service: General;  Laterality: N/A;  Cholecystectomy with IOC  . TOE FUSION    . TUBAL LIGATION  1980   Family History  Problem Relation Age of Onset  . Cancer Mother        stomach  . Heart disease Father   . Cancer Maternal Aunt        ovarian  . Cancer Paternal Aunt        breast   Social History   Socioeconomic History  . Marital status: Divorced    Spouse name: Not on file  . Number of children: 1  . Years of education: 31  . Highest education level: High school graduate  Occupational History  . Occupation: Retired     Fish farm manager: [employer]  Social Needs  . Financial resource strain: Not hard at all  . Food insecurity:    Worry: Never true    Inability: Never true  . Transportation needs:    Medical: No    Non-medical: No  Tobacco Use  . Smoking status: Former Smoker    Packs/day: 0.25    Years: 36.00    Pack years: 9.00    Types: Cigarettes    Start date: 09/30/1973    Last attempt to quit: 01/19/2016    Years since quitting: 2.0  . Smokeless tobacco: Never Used  Substance and Sexual Activity  . Alcohol use: No    Alcohol/week: 0.0 standard drinks  . Drug use: No  . Sexual activity: Not on file  Lifestyle  . Physical activity:    Days per week: 0 days    Minutes per session: 0 min  . Stress: Not at all  Relationships  . Social connections:    Talks on phone: More than three times a week    Gets together: More than three times a week    Attends religious service: More than 4 times per year    Active member of club or organization: No    Attends meetings of clubs or organizations: Never    Relationship status: Divorced  Other Topics Concern  . Not on file  Social History Narrative    . Not on file      Clinical Intake:     Pain Score: 4                   Activities of Daily Living In your present state of health, do you have any difficulty performing the following activities: 02/07/2018  Hearing? N  Vision? N  Difficulty concentrating or making decisions? Y  Comment Trouble concentrating  and remembering at times  Walking or climbing stairs? N  Dressing or bathing? N  Doing errands, shopping? N  Preparing Food and eating ? N  Using the Toilet? N  In the past six months, have you accidently leaked urine? N  Do you have problems with loss of bowel control? Y  Comment Has bowel incontinence and constipation occassionally, has seen GI for this problem  Managing your Medications? N  Managing your Finances? N  Housekeeping or managing your Housekeeping? N  Some recent data might be hidden     Immunizations and Health Maintenance Immunization History  Administered Date(s) Administered  . Influenza Inj Mdck Quad Pf 10/19/2016  . Influenza, High Dose Seasonal PF 11/07/2017  . Influenza-Unspecified 10/30/2015  . Pneumococcal Conjugate-13 11/07/2017  . Zoster 10/29/2014   Health Maintenance Due  Topic Date Due  . Hepatitis C Screening  Jan 06, 1951  . COLONOSCOPY  05/26/2016  . MAMMOGRAM  01/19/2018    Recommend Hepatitis C screening at next visit with Particia Nearing, PA.  Patient states she does not have time to have test drawn today. Referral sent to Dr. Oletta Lamas for colonoscopy per patient's request.  Mammogram scheduled in March per patient.  Patient Care Team: [name] as PCP - General (General Practice) [name] as Referring Physician (General Practice) [name], MD as Consulting Physician (Gastroenterology) [name], MD as Consulting Physician (Orthopedic Surgery)  Indicate any recent Medical Services you may have received from other than [facility] providers in the past year (date may be approximate).      Assessment:   This is a routine wellness examination for Nailea.  Hearing/Vision screen No deficits noted  Patient goes to [facility] for eye exams  Dietary issues and exercise activities discussed:  Ms. Michels states she eats 2 meals per day and snacks as needed.  She usually has cereal, peanut butter crackers, or fruit for breakfast, and meat and various sides for supper.  She states she has access to all the food she needs.  Recommended a diet of mostly non-starchy vegetables, fruits, lean proteins, and whole grains.   Current Exercise Habits: The patient does not participate in regular exercise at present  Ms. Marcum stays active working around her home.  She has 13 acres of land horses, dogs, and a cat that she takes care of, but she does not participate in regular planned exercise.   Goals    . Exercise 3x per week (30 min per time)      Depression Screen PHQ 2/9 Scores 02/07/2018 12/05/2017 11/14/2017 09/05/2017 07/18/2017 05/02/2017 03/09/2017  PHQ - 2 Score 0 0 0 0 1 1 1   PHQ- 9 Score - - - - - - -    Fall Risk Fall Risk  02/07/2018 12/05/2017 11/14/2017 09/05/2017 07/18/2017  Falls in the past year? 0 0 No Yes No  Number falls in past yr: - - - 1 -  Injury with Fall? - - - No -    Is the patient's home free of loose throw rugs in walkways, pet beds, electrical cords, etc?   yes      Grab bars in the bathroom? yes      Handrails on the stairs?   no stairs in home      Adequate lighting?   yes    Cognitive Function: MMSE - Mini Mental State Exam 02/07/2018  Orientation to time 5  Orientation to Place 5  Registration 3  Attention/  Calculation 5  Recall 2  Language- name 2 objects 2  Language- repeat 1  Language- follow 3 step command 3  Language- read & follow direction 1  Write a sentence 1  Copy design 1  Total score 29        Screening Tests Health Maintenance  Topic Date Due  . Hepatitis C Screening  1950/04/24  . COLONOSCOPY  05/26/2016  . MAMMOGRAM   01/19/2018  . TETANUS/TDAP  02/08/2019 (Originally 09/30/1969)  . PNA vac Low Risk Adult (2 of 2 - PPSV23) 11/08/2018  . DEXA SCAN  03/11/2019  . INFLUENZA VACCINE  Completed    Qualifies for Shingles Vaccine? Yes, declined today due to price  Cancer Screenings: Lung: Low Dose CT Chest recommended if Age 61-80 years, 30 pack-year currently smoking OR have quit w/in 15years. Patient does qualify. Breast: Up to date on Mammogram? No, mammogram scheduled for March at [facility] Up to date of Bone Density/Dexa? Yes Colorectal: referral to Dr. Oletta Lamas sent today  Additional Screenings:  Hepatitis C Screening: Recommend at next visit with Particia Nearing, PA.      Plan:       . Please work on your goal of exercising 3x per week (30 min per time)     Remember to go for your mammogram in March.   Call Dr. Oletta Lamas office to schedule your colonoscopy at [phone].  Please follow up with Particia Nearing, PA as scheduled.   I have personally reviewed and noted the following in the patient's chart:   . Medical and social history . Use of alcohol, tobacco or illicit drugs  . Current medications and supplements . Functional ability and status . Nutritional status . Physical activity . Advanced directives . List of other physicians . Hospitalizations, surgeries, and ER visits in previous 12 months . Vitals . Screenings to include cognitive, depression, and falls . Referrals and appointments  In addition, I have reviewed and discussed with patient certain preventive protocols, quality metrics, and best practice recommendations. A written personalized care plan for preventive services as well as general preventive health recommendations were provided to patient.     Stefanee Mckell M, RN   02/07/2018   I have reviewed and agree with the above AWV documentation.   [name] PA-C [facility] [address]  [place], [place]  [address] [phone]

## 2018-02-13 ENCOUNTER — Ambulatory Visit: Payer: Medicare HMO | Admitting: *Deleted

## 2018-02-13 NOTE — Progress Notes (Signed)
Erroneous encounter Cost of Tdap $67 Pt declined due to cost

## 2018-02-19 ENCOUNTER — Other Ambulatory Visit (INDEPENDENT_AMBULATORY_CARE_PROVIDER_SITE_OTHER): Payer: Self-pay | Admitting: Orthopaedic Surgery

## 2018-02-20 NOTE — Telephone Encounter (Signed)
[place] for 90 day rx refill?

## 2018-02-23 ENCOUNTER — Ambulatory Visit (INDEPENDENT_AMBULATORY_CARE_PROVIDER_SITE_OTHER): Payer: Medicare HMO | Admitting: Orthopaedic Surgery

## 2018-02-23 ENCOUNTER — Encounter (INDEPENDENT_AMBULATORY_CARE_PROVIDER_SITE_OTHER): Payer: Self-pay | Admitting: Orthopaedic Surgery

## 2018-02-23 VITALS — BP 121/85 | HR 67 | Ht 63.0 in | Wt 150.0 lb

## 2018-02-23 DIAGNOSIS — M47816 Spondylosis without myelopathy or radiculopathy, lumbar region: Secondary | ICD-10-CM | POA: Diagnosis not present

## 2018-02-23 NOTE — Progress Notes (Signed)
Office Visit Note   Patient: Jaclyn Schmidt           Date of Birth: [birth year]           MRN: [id number] Visit Date: 02/23/2018              Requested by: [name], PA-C [address] [place], [place] [address] PCP: [name], PA-C   Assessment & Plan: Visit Diagnoses:  1. Facet degeneration of lumbar region     Plan: Patient has some upper lumbar facet degeneration with trace listhesis L1-L2 3.  Her radicular symptoms have improved with some Neurontin.  Should continue core strengthening exercises.  Symptoms are not resting and I will check her again in 2 months.  She is having persistent problems will consider diagnostic MRI imaging.  If her red flags.  Follow-Up Instructions: Return in about 2 months (around 04/24/2018).   Orders:  No orders of the defined types were placed in this encounter.  No orders of the defined types were placed in this encounter.     Procedures: No procedures performed   Clinical Data: No additional findings.   Subjective: Chief Complaint  Patient presents with  . Lower Back - Pain  . Left Hip - Pain    HPI 25 68-year-old female returns with ongoing problems with back pain.  States the Neurontin has helped the tingling and stinging that she had in her left leg.  She still has discomfort in her back with turning and twisting primarily upper lumbar.  She has been on a home exercise program and does them every morning it takes about 20 minutes to complete all exercises instructed from physical therapy.  Denies bowel bladder symptoms no chills or fever.  Review of Systems 14 point update unchanged from 01/26/2018 other than as mentioned in HPI.   Objective: Vital Signs: BP 121/85   Pulse 67   Ht 5\' 3"  (1.6 m)   Wt 150 lb (68 kg)   BMI 26.57 kg/m   Physical Exam Constitutional:      Appearance: She is well-developed.  HENT:     Head: Normocephalic.     Right Ear: External ear normal.     Left Ear: External ear normal.  Eyes:    Pupils: Pupils are equal, round, and reactive to light.  Neck:     Thyroid: No thyromegaly.     Trachea: No tracheal deviation.  Cardiovascular:     Rate and Rhythm: Normal rate.  Pulmonary:     Effort: Pulmonary effort is normal.  Abdominal:     Palpations: Abdomen is soft.  Skin:    General: Skin is warm and dry.  Neurological:     Mental Status: She is alert and oriented to person, place, and time.  Psychiatric:        Behavior: Behavior normal.     Ortho Exam patient gets from sitting to standing easily ambulates heel toe walking normal no anterior tib gastrocsoleus hip flexion weakness negative logroll to the hips mild tenderness on the greater trochanteric bursa region both right and left fairly symmetrical.  Mild sciatic notch to tenderness on the left negative on the right.  No calf atrophy no rash over exposed skin distal pulses are intact.  Reflexes are trace and symmetrical.  Specialty Comments:  No specialty comments available.  Imaging: No results found.   PMFS History: Patient Active Problem List   Diagnosis Date Noted  . Upper respiratory tract infection 12/05/2017  .  Vertigo 09/05/2017  . Hypothyroidism 07/18/2017  . Osteopenia 03/09/2017  . Anal sphincter incompetence 12/29/2016  . Depression, recurrent ([place]) 12/29/2016  . Chronic midline thoracic back pain 09/21/2016  . Lumbar pain 09/21/2016  . Gall bladder disease 06/10/2011  . Cholelithiasis 06/03/2011   Past Medical History:  Diagnosis Date  . Anxiety   . Cholelithiasis   . GERD (gastroesophageal reflux disease)   . History of kidney stones   . Hyperlipidemia   . Hypertension   . Hypothyroidism   . Irritable bowel syndrome   . Pneumonia   . Thyroid disease   . Vitamin D deficiency     Family History  Problem Relation Age of Onset  . Cancer Mother        stomach  . Heart disease Father   . Cancer Maternal Aunt        ovarian  . Cancer Paternal Aunt        breast    Past Surgical  History:  Procedure Laterality Date  . ABDOMINAL HYSTERECTOMY  [id number]  . ANTERIOR CERVICAL DECOMP/DISCECTOMY FUSION N/A 01/21/2017   Procedure: C3-4, C4-5 ANTERIOR CERVICAL DISCECTOMY AND FUSION, ALLOGRAFT, PLATE;  Surgeon: [name], MD;  Location: [facility];  Service: Orthopedics;  Laterality: N/A;  . APPENDECTOMY  1996  . BREAST MASS EXCISION  1997   left  . CHOLECYSTECTOMY  07/15/2011   Procedure: LAPAROSCOPIC CHOLECYSTECTOMY WITH INTRAOPERATIVE CHOLANGIOGRAM;  Surgeon: Shann Medal, MD;  Location: [facility];  Service: General;  Laterality: N/A;  Cholecystectomy with IOC  . TOE FUSION    . TUBAL LIGATION  1980   Social History   Occupational History  . Occupation: Retired     Fish farm manager: [employer]  Tobacco Use  . Smoking status: Former Smoker    Packs/day: 0.25    Years: 36.00    Pack years: 9.00    Types: Cigarettes    Start date: 09/30/1973    Last attempt to quit: 01/19/2016    Years since quitting: 2.0  . Smokeless tobacco: Never Used  Substance and Sexual Activity  . Alcohol use: No    Alcohol/week: 0.0 standard drinks  . Drug use: No  . Sexual activity: Not on file

## 2018-03-20 ENCOUNTER — Other Ambulatory Visit: Payer: Self-pay | Admitting: Physician Assistant

## 2018-03-20 DIAGNOSIS — F339 Major depressive disorder, recurrent, unspecified: Secondary | ICD-10-CM

## 2018-03-20 DIAGNOSIS — Z1231 Encounter for screening mammogram for malignant neoplasm of breast: Secondary | ICD-10-CM | POA: Diagnosis not present

## 2018-03-20 LAB — HM MAMMOGRAPHY: HM Mammogram: ABNORMAL — AB (ref 0–4)

## 2018-03-21 ENCOUNTER — Other Ambulatory Visit: Payer: Self-pay | Admitting: Physician Assistant

## 2018-03-21 DIAGNOSIS — Z0000 Encounter for general adult medical examination without abnormal findings: Secondary | ICD-10-CM

## 2018-03-21 NOTE — Telephone Encounter (Signed)
Last lipid 07/18/17

## 2018-04-05 DIAGNOSIS — N6314 Unspecified lump in the right breast, lower inner quadrant: Secondary | ICD-10-CM | POA: Diagnosis not present

## 2018-04-05 DIAGNOSIS — N6315 Unspecified lump in the right breast, overlapping quadrants: Secondary | ICD-10-CM | POA: Diagnosis not present

## 2018-04-05 DIAGNOSIS — N6313 Unspecified lump in the right breast, lower outer quadrant: Secondary | ICD-10-CM | POA: Diagnosis not present

## 2018-04-12 ENCOUNTER — Other Ambulatory Visit: Payer: Self-pay | Admitting: Physician Assistant

## 2018-04-12 DIAGNOSIS — N6314 Unspecified lump in the right breast, lower inner quadrant: Secondary | ICD-10-CM | POA: Diagnosis not present

## 2018-04-12 DIAGNOSIS — Z170 Estrogen receptor positive status [ER+]: Secondary | ICD-10-CM | POA: Diagnosis not present

## 2018-04-12 DIAGNOSIS — C50811 Malignant neoplasm of overlapping sites of right female breast: Secondary | ICD-10-CM | POA: Diagnosis not present

## 2018-04-12 DIAGNOSIS — N6313 Unspecified lump in the right breast, lower outer quadrant: Secondary | ICD-10-CM | POA: Diagnosis not present

## 2018-04-12 DIAGNOSIS — F339 Major depressive disorder, recurrent, unspecified: Secondary | ICD-10-CM

## 2018-04-14 ENCOUNTER — Other Ambulatory Visit: Payer: Self-pay | Admitting: *Deleted

## 2018-04-14 DIAGNOSIS — C50311 Malignant neoplasm of lower-inner quadrant of right female breast: Secondary | ICD-10-CM

## 2018-04-17 ENCOUNTER — Telehealth: Payer: Self-pay | Admitting: Physician Assistant

## 2018-04-17 NOTE — Telephone Encounter (Signed)
Pt aware results from path report are not back yet but as soon as I get them I will give her a call with the additional information

## 2018-04-19 DIAGNOSIS — C801 Malignant (primary) neoplasm, unspecified: Secondary | ICD-10-CM

## 2018-04-19 HISTORY — DX: Malignant (primary) neoplasm, unspecified: C80.1

## 2018-04-21 ENCOUNTER — Telehealth (INDEPENDENT_AMBULATORY_CARE_PROVIDER_SITE_OTHER): Payer: Self-pay | Admitting: Radiology

## 2018-04-21 MED ORDER — GABAPENTIN 300 MG PO CAPS: 300.0000 mg | ORAL_CAPSULE | Freq: Two times a day (BID) | ORAL | 3 refills | Status: DC

## 2018-04-21 NOTE — Addendum Note (Signed)
Addended by: Meyer Cory on: 04/21/2018 02:47 PM   Modules accepted: Orders

## 2018-04-21 NOTE — Telephone Encounter (Signed)
Patient called [place] office in regards to refill on Gabapentin. She states that she was originally taking one Gabapentin daily, but at her last office visit, she was told by Dr. Lorin Mercy that she could take two daily. She is going to run out of this medication and requests refill with directions of 2 daily given.  Please advise.  CB for patient is [phone]

## 2018-04-21 NOTE — Telephone Encounter (Signed)
Med entered- I called patient and advised.

## 2018-04-21 NOTE — Telephone Encounter (Signed)
Ok to send in for 2 daily thanks # 60 refill times 3 thanks

## 2018-04-26 ENCOUNTER — Telehealth (INDEPENDENT_AMBULATORY_CARE_PROVIDER_SITE_OTHER): Payer: Self-pay | Admitting: Radiology

## 2018-04-26 ENCOUNTER — Encounter: Payer: Self-pay | Admitting: Adult Health

## 2018-04-26 DIAGNOSIS — R69 Illness, unspecified: Secondary | ICD-10-CM | POA: Diagnosis not present

## 2018-04-26 DIAGNOSIS — C50311 Malignant neoplasm of lower-inner quadrant of right female breast: Secondary | ICD-10-CM | POA: Insufficient documentation

## 2018-04-26 DIAGNOSIS — C50911 Malignant neoplasm of unspecified site of right female breast: Secondary | ICD-10-CM | POA: Diagnosis not present

## 2018-04-26 DIAGNOSIS — Z170 Estrogen receptor positive status [ER+]: Secondary | ICD-10-CM | POA: Diagnosis not present

## 2018-04-26 NOTE — Telephone Encounter (Signed)
I called patient to confirm appointment on 04/27/2018. Patient cancelled and will call if she needs anything.

## 2018-04-27 ENCOUNTER — Ambulatory Visit (INDEPENDENT_AMBULATORY_CARE_PROVIDER_SITE_OTHER): Payer: Medicare HMO | Admitting: Orthopaedic Surgery

## 2018-04-27 ENCOUNTER — Encounter: Payer: Self-pay | Admitting: *Deleted

## 2018-05-01 DIAGNOSIS — Z170 Estrogen receptor positive status [ER+]: Secondary | ICD-10-CM | POA: Diagnosis not present

## 2018-05-01 DIAGNOSIS — C50511 Malignant neoplasm of lower-outer quadrant of right female breast: Secondary | ICD-10-CM | POA: Diagnosis not present

## 2018-05-03 DIAGNOSIS — C50911 Malignant neoplasm of unspecified site of right female breast: Secondary | ICD-10-CM | POA: Diagnosis not present

## 2018-05-10 DIAGNOSIS — C50412 Malignant neoplasm of upper-outer quadrant of left female breast: Secondary | ICD-10-CM | POA: Diagnosis not present

## 2018-05-10 DIAGNOSIS — Z170 Estrogen receptor positive status [ER+]: Secondary | ICD-10-CM | POA: Diagnosis not present

## 2018-05-19 ENCOUNTER — Other Ambulatory Visit: Payer: Self-pay | Admitting: Physician Assistant

## 2018-05-19 DIAGNOSIS — C50919 Malignant neoplasm of unspecified site of unspecified female breast: Secondary | ICD-10-CM

## 2018-05-19 DIAGNOSIS — F339 Major depressive disorder, recurrent, unspecified: Secondary | ICD-10-CM

## 2018-05-19 HISTORY — DX: Malignant neoplasm of unspecified site of unspecified female breast: C50.919

## 2018-05-19 NOTE — Telephone Encounter (Signed)
OV 06/06/18

## 2018-05-23 ENCOUNTER — Other Ambulatory Visit: Payer: Self-pay | Admitting: Surgery

## 2018-05-23 DIAGNOSIS — C50911 Malignant neoplasm of unspecified site of right female breast: Secondary | ICD-10-CM

## 2018-05-23 DIAGNOSIS — Z170 Estrogen receptor positive status [ER+]: Principal | ICD-10-CM

## 2018-05-24 ENCOUNTER — Other Ambulatory Visit: Payer: Self-pay | Admitting: General Surgery

## 2018-05-24 ENCOUNTER — Encounter (HOSPITAL_BASED_OUTPATIENT_CLINIC_OR_DEPARTMENT_OTHER): Payer: Self-pay | Admitting: *Deleted

## 2018-05-24 ENCOUNTER — Other Ambulatory Visit: Payer: Self-pay | Admitting: Surgery

## 2018-05-24 ENCOUNTER — Other Ambulatory Visit: Payer: Self-pay

## 2018-05-24 DIAGNOSIS — C50911 Malignant neoplasm of unspecified site of right female breast: Secondary | ICD-10-CM

## 2018-05-24 DIAGNOSIS — Z170 Estrogen receptor positive status [ER+]: Principal | ICD-10-CM

## 2018-05-26 ENCOUNTER — Ambulatory Visit
Admission: RE | Admit: 2018-05-26 | Discharge: 2018-05-26 | Disposition: A | Discharge status: Home / Self Care | Payer: Medicare HMO | Source: Ambulatory Visit | Attending: Surgery | Admitting: Surgery

## 2018-05-26 ENCOUNTER — Other Ambulatory Visit (HOSPITAL_COMMUNITY)
Admission: RE | Admit: 2018-05-26 | Discharge: 2018-05-26 | Disposition: A | Discharge status: Home / Self Care | Payer: Medicare HMO | Source: Ambulatory Visit | Attending: Surgery | Admitting: Surgery

## 2018-05-26 ENCOUNTER — Encounter (HOSPITAL_BASED_OUTPATIENT_CLINIC_OR_DEPARTMENT_OTHER): Payer: Medicare HMO

## 2018-05-26 ENCOUNTER — Other Ambulatory Visit: Payer: Self-pay

## 2018-05-26 ENCOUNTER — Other Ambulatory Visit: Payer: Self-pay | Admitting: Surgery

## 2018-05-26 DIAGNOSIS — Z170 Estrogen receptor positive status [ER+]: Secondary | ICD-10-CM

## 2018-05-26 DIAGNOSIS — Z1159 Encounter for screening for other viral diseases: Secondary | ICD-10-CM | POA: Insufficient documentation

## 2018-05-26 DIAGNOSIS — C50911 Malignant neoplasm of unspecified site of right female breast: Secondary | ICD-10-CM

## 2018-05-26 NOTE — Progress Notes (Signed)
Pt given and instructed to drink Ensure day of surgery by 0900 with teach back method.

## 2018-05-28 LAB — NOVEL CORONAVIRUS, NAA (HOSP ORDER, SEND-OUT TO REF LAB; TAT 18-24 HRS): SARS-CoV-2, NAA: NOT DETECTED

## 2018-05-29 ENCOUNTER — Other Ambulatory Visit: Payer: Self-pay | Admitting: Surgery

## 2018-05-29 NOTE — H&P (Signed)
Jaclyn Schmidt  Location: [facility] Surgery Patient #: [id number] DOB: 03-11-1950 Divorced / Language: Cleophus Molt / Race: White Female  History of Present Illness   The patient is a 68 year old female who presents with a complaint of breast cancer.  The PCP is April Jones, [place]. [facility])  The patient was referred by April Jones, [place].  She comes with her sister, Jaclyn Schmidt.  [Corona virus days]  She had a left breast biopsy in the 1990s which was benign. She underwent a recent mammogram in [place] [facility]). The mammogram done on 05 April 2018 showed a hypoechoic mass in the right breast at 6 o'clock position measuring 0.8 x 0.8 x 0.9 cm. She had no axillary adenopathy. Her last mammogram was about 1 year ago. She is not on hormone med. Her last menstrual period was around 2003. She has no family history of breast cancer. She had a right breast biopsy - 04/12/2018 ([id number]) - IDC, grade II, ER/PR positive, Her2Neu - neg, Ki67 - 15%  I discussed the options for breast cancer treatment with the patient. I discussed a multidisciplinary approach to the treatment of breast cancer, which includes medical oncology and radiation oncology. I discussed the surgical options of lumpectomy vs. mastectomy. If mastectomy, there is the possibility of reconstruction. I discussed the options of lymph node biopsy. The treatment plan depends on the pathologic staging of the tumor and the patient's personal wishes. The risks of surgery include, but are not limited to, bleeding, infection, the need for further surgery, and nerve injury. The patient has been given literature on the treatment of breast cancer.  Plan: 1) referral to medical oncolgy (Dr. Tressie Stalker - I spoke to Dr. Tressie Stalker on the phone) and radiation oncolgy [facility]), 2) Right breast lumpectomy (seed localization) and right axillary sentinel lymph node biopsy - when Corona virus restrictions lift  Review of  Systems as stated in this history (HPI) or in the review of systems. Otherwise all other 12 point ROS are negative  Past Medical History: 1. Smokes We talked about stopping smoking - she has in the past. Her sister smokes. 2. Has been off BP meds about 3 years 3. Has loose stools vs incontinence - sees Dr. Alycia Rossetti She is up to date on her colonoscopy We talked about seeing one our colo rectal surgeons once the Covid problems improve. 4. Cholecystectomy - 2013 - [facility] Roszak 5. History of appendectomy 6. Partial hysterectomy (Dr. Virginia Crews tack/foot surgery - 2013 7. Back issues Seeing Dr. Lorin Mercy - to get an MRI of back Pain improved on Gabapentin  Social History: Divorced - lives by self. (her first husband was Junior Nicki Reaper - died of melanoma in the 1990's) Has one son - [place] and 3 grandchildren She is retired as of May 2019. She now sits for an elder person.  Past Surgical History (Tanisha A. Owens Shark, [place]; 04/26/2018 9:27 AM) Breast Biopsy  Right. Foot Surgery  Right. Gallbladder Surgery - Laparoscopic  Hysterectomy (not due to cancer) - Partial  Oral Surgery  Spinal Surgery - Neck  Tonsillectomy   Diagnostic Studies History (Tanisha A. Owens Shark, [place]; 04/26/2018 9:27 AM) Colonoscopy  1-5 years ago Mammogram  within last year Pap Smear  1-5 years ago  Allergies (Tanisha A. Owens Shark, [place]; 04/26/2018 9:28 AM) Penicillins  Allergies Reconciled   Medication History (Tanisha A. Owens Shark, [place]; 04/26/2018 9:29 AM) Gabapentin (300MG Capsule, Oral) Active. Sertraline HCl (50MG Tablet, Oral) Active. Pravastatin Sodium (40MG Tablet, Oral) Active. Biotin (10000MCG Tablet, Oral)  Active. ALPRAZolam (1MG Tablet, Oral) Active. Vitamin D3 (50 MCG(2000 UT) Tablet Chewable, Oral) Active. Levothyroxine Sodium (75MCG Tablet, Oral) Active. Medications Reconciled  Social History (Tanisha A. Owens Shark, [place]; 04/26/2018 9:27 AM) Alcohol use  Remotely  quit alcohol use. Caffeine use  Carbonated beverages, Tea. No drug use  Tobacco use  Current some day smoker.  Family History (Tanisha A. Owens Shark, [place]; 04/26/2018 9:27 AM) Breast Cancer  Family Members In [place]  Mother. Cerebrovascular Accident  Family Members In General, Father. Diabetes Mellitus  Mother. Heart Disease  Family Members In General, Father. Hypertension  Brother, Family Members In [place], Father. Kidney Disease  Family Members In General, Father. Melanoma  Family Members In General. Respiratory Condition  Father. Seizure disorder  Father. Thyroid problems  Mother.  Pregnancy / Birth History (Tanisha A. Owens Shark, [place]; 04/26/2018 9:27 AM) Age at menarche  73 years. Age of menopause  18-50 Gravida  3 Maternal age  52-25 Para  1  Other Problems (Tanisha A. Owens Shark, [place]; 04/26/2018 9:27 AM) Anxiety Disorder  Back Pain  Bladder Problems  Cholelithiasis  Depression  Gastroesophageal Reflux Disease  Hemorrhoids  High blood pressure  Hypercholesterolemia  Kidney Stone  Lump In Breast  Thyroid Disease     Review of Systems (Tanisha A. Brown RMA; 04/26/2018 9:27 AM) General Present- Fatigue. Not Present- Appetite Loss, Chills, Fever, Night Sweats, Weight Gain and Weight Loss. Skin Not Present- Change in Wart/Mole, Dryness, Hives, Jaundice, New Lesions, Non-Healing Wounds, Rash and Ulcer. HEENT Present- Seasonal Allergies and Wears glasses/contact lenses. Not Present- Earache, Hearing Loss, Hoarseness, Nose Bleed, Oral Ulcers, Ringing in the Ears, Sinus Pain, Sore Throat, Visual Disturbances and Yellow Eyes. Respiratory Not Present- Bloody sputum, Chronic Cough, Difficulty Breathing, Snoring and Wheezing. Breast Present- Breast Mass. Not Present- Breast Pain, Nipple Discharge and Skin Changes. Cardiovascular Not Present- Chest Pain, Difficulty Breathing Lying Down, Leg Cramps, Palpitations, Rapid Heart Rate, Shortness of Breath and Swelling  of Extremities. Gastrointestinal Present- Bloating, Change in Bowel Habits, Chronic diarrhea, Excessive gas and Hemorrhoids. Not Present- Abdominal Pain, Bloody Stool, Constipation, Difficulty Swallowing, Gets full quickly at meals, Indigestion, Nausea, Rectal Pain and Vomiting. Female Genitourinary Not Present- Frequency, Nocturia, Painful Urination, Pelvic Pain and Urgency. Musculoskeletal Present- Back Pain and Joint Stiffness. Not Present- Joint Pain, Muscle Pain, Muscle Weakness and Swelling of Extremities. Neurological Present- Headaches. Not Present- Decreased Memory, Fainting, Numbness, Seizures, Tingling, Tremor, Trouble walking and Weakness. Psychiatric Present- Anxiety. Not Present- Bipolar, Change in Sleep Pattern, Depression, Fearful and Frequent crying. Hematology Not Present- Blood Thinners, Easy Bruising, Excessive bleeding, Gland problems, HIV and Persistent Infections.  Vitals (Tanisha A. Brown RMA; 04/26/2018 9:28 AM) 04/26/2018 9:28 AM Weight: 147.2 lb Height: 63in Body Surface Area: 1.7 m Body Mass Index: 26.08 kg/m  Temp.: 98.35F  Pulse: 66 (Regular)  BP: 128/74 (Sitting, Left Arm, Standard)   Physical Exam  General: WN older WF who is alert and generally healthy appearing. HEENT: Normal. Pupils equal.  Neck: Supple. No mass. No thyroid mass. Lymph Nodes: No supraclavicular, cervical, or axillary nodes. Particular attention paid to right axilla.  Lungs: Clear to auscultation and symmetric breath sounds. Heart: RRR. No murmur or rub.  Breasts: Right: I do not see a biopsy site or feel a mass  Left: No mass or nodule  Abdomen: Soft. No mass. No tenderness. No hernia. Normal bowel sounds. No abdominal scars. Rectal: Not done.  Extremities: Good strength and ROM in upper and lower extremities.  Neurologic: Grossly intact to motor and sensory function.  Psychiatric: Has normal mood and affect. Behavior is normal.   Assessment & Plan  1.  MALIGNANT  NEOPLASM OF RIGHT BREAST, STAGE 1, ESTROGEN RECEPTOR POSITIVE (C50.911)  Story: She had a right breast biopsy - 04/12/2018 ([id number]) - IDC, grade II, ER/PR positive, Her2Neu - neg, Ki67 - 15%  Plan:   1) referral to medical oncolgy ([place] - 05/01/2018) and radiation oncolgy [facility] - saw Dr. Leeanne Rio - 05/03/2018)   Will plan anti estrogen therapy until Corona virus situation better - I discussed this with Dr. Tressie Stalker   2) Right breast lumpectomy (seed localization) and right axillary sentinel lymph node biopsy - when Corona virus restrictions lift  2.  SMOKES (F17.200)  She knows that she needs to quit  3. Has been off BP meds about 3 years 4. Has loose stools vs incontinence - sees Dr. Alycia Rossetti  She is up to date on her colonoscopy  We talked about seeing one our colo rectal surgeons once the Covid problems improve. 5. Back issues  Seeing Dr. Lorin Mercy - to get an MRI of back  Pain improved on Gabapentin   Alphonsa Overall, MD, [facility] Pager: [phone] Office phone:  [phone]

## 2018-05-30 ENCOUNTER — Ambulatory Visit (HOSPITAL_BASED_OUTPATIENT_CLINIC_OR_DEPARTMENT_OTHER)
Admission: RE | Admit: 2018-05-30 | Discharge: 2018-05-30 | Disposition: A | Discharge status: Home / Self Care | Payer: Medicare HMO | Attending: Surgery | Admitting: Surgery

## 2018-05-30 ENCOUNTER — Encounter (HOSPITAL_COMMUNITY)
Admission: RE | Admit: 2018-05-30 | Discharge: 2018-05-30 | Disposition: A | Discharge status: Home / Self Care | Payer: Medicare HMO | Source: Ambulatory Visit | Attending: Surgery | Admitting: Surgery

## 2018-05-30 ENCOUNTER — Ambulatory Visit
Admission: RE | Admit: 2018-05-30 | Discharge: 2018-05-30 | Disposition: A | Discharge status: Home / Self Care | Payer: Medicare HMO | Source: Ambulatory Visit | Attending: Surgery | Admitting: Surgery

## 2018-05-30 ENCOUNTER — Other Ambulatory Visit: Payer: Self-pay

## 2018-05-30 ENCOUNTER — Encounter (HOSPITAL_BASED_OUTPATIENT_CLINIC_OR_DEPARTMENT_OTHER): Payer: Self-pay

## 2018-05-30 ENCOUNTER — Ambulatory Visit (HOSPITAL_BASED_OUTPATIENT_CLINIC_OR_DEPARTMENT_OTHER): Payer: Medicare HMO | Admitting: Anesthesiology

## 2018-05-30 ENCOUNTER — Encounter (HOSPITAL_BASED_OUTPATIENT_CLINIC_OR_DEPARTMENT_OTHER)
Admission: RE | Disposition: A | Discharge status: Home / Self Care | Payer: Self-pay | Source: Home / Self Care | Attending: Surgery

## 2018-05-30 DIAGNOSIS — Z170 Estrogen receptor positive status [ER+]: Secondary | ICD-10-CM | POA: Diagnosis not present

## 2018-05-30 DIAGNOSIS — F329 Major depressive disorder, single episode, unspecified: Secondary | ICD-10-CM | POA: Diagnosis not present

## 2018-05-30 DIAGNOSIS — K219 Gastro-esophageal reflux disease without esophagitis: Secondary | ICD-10-CM | POA: Insufficient documentation

## 2018-05-30 DIAGNOSIS — Z79899 Other long term (current) drug therapy: Secondary | ICD-10-CM | POA: Insufficient documentation

## 2018-05-30 DIAGNOSIS — C50911 Malignant neoplasm of unspecified site of right female breast: Secondary | ICD-10-CM | POA: Diagnosis not present

## 2018-05-30 DIAGNOSIS — F17200 Nicotine dependence, unspecified, uncomplicated: Secondary | ICD-10-CM | POA: Insufficient documentation

## 2018-05-30 DIAGNOSIS — E7800 Pure hypercholesterolemia, unspecified: Secondary | ICD-10-CM | POA: Insufficient documentation

## 2018-05-30 DIAGNOSIS — C50311 Malignant neoplasm of lower-inner quadrant of right female breast: Secondary | ICD-10-CM | POA: Diagnosis not present

## 2018-05-30 DIAGNOSIS — R69 Illness, unspecified: Secondary | ICD-10-CM | POA: Diagnosis not present

## 2018-05-30 DIAGNOSIS — D0511 Intraductal carcinoma in situ of right breast: Secondary | ICD-10-CM | POA: Insufficient documentation

## 2018-05-30 DIAGNOSIS — C50811 Malignant neoplasm of overlapping sites of right female breast: Secondary | ICD-10-CM | POA: Insufficient documentation

## 2018-05-30 DIAGNOSIS — F419 Anxiety disorder, unspecified: Secondary | ICD-10-CM | POA: Insufficient documentation

## 2018-05-30 DIAGNOSIS — Z79890 Hormone replacement therapy (postmenopausal): Secondary | ICD-10-CM | POA: Insufficient documentation

## 2018-05-30 DIAGNOSIS — E039 Hypothyroidism, unspecified: Secondary | ICD-10-CM | POA: Insufficient documentation

## 2018-05-30 DIAGNOSIS — G8918 Other acute postprocedural pain: Secondary | ICD-10-CM | POA: Diagnosis not present

## 2018-05-30 HISTORY — PX: BREAST LUMPECTOMY: SHX2

## 2018-05-30 HISTORY — DX: Depression, unspecified: F32.A

## 2018-05-30 HISTORY — PX: BREAST LUMPECTOMY WITH RADIOACTIVE SEED AND SENTINEL LYMPH NODE BIOPSY: SHX6550

## 2018-05-30 HISTORY — DX: Malignant (primary) neoplasm, unspecified: C80.1

## 2018-05-30 HISTORY — DX: Major depressive disorder, single episode, unspecified: F32.9

## 2018-05-30 SURGERY — BREAST LUMPECTOMY WITH RADIOACTIVE SEED AND SENTINEL LYMPH NODE BIOPSY
Anesthesia: General | Site: Breast | Laterality: Right

## 2018-05-30 MED ORDER — LIDOCAINE 2% (20 MG/ML) 5 ML SYRINGE
INTRAMUSCULAR | Status: AC
  Filled 2018-05-30: qty 5

## 2018-05-30 MED ORDER — SCOPOLAMINE 1 MG/3DAYS TD PT72: 1.0000 | MEDICATED_PATCH | Freq: Once | TRANSDERMAL | Status: DC | PRN

## 2018-05-30 MED ORDER — MIDAZOLAM HCL 2 MG/2ML IJ SOLN
INTRAMUSCULAR | Status: AC
  Filled 2018-05-30: qty 2

## 2018-05-30 MED ORDER — CEFAZOLIN SODIUM-DEXTROSE 2-4 GM/100ML-% IV SOLN
INTRAVENOUS | Status: AC
  Filled 2018-05-30: qty 100

## 2018-05-30 MED ORDER — ACETAMINOPHEN 500 MG PO TABS
1000.0000 mg | ORAL_TABLET | ORAL | Status: AC
  Administered 2018-05-30: 1000 mg via ORAL

## 2018-05-30 MED ORDER — EPHEDRINE SULFATE 50 MG/ML IJ SOLN
INTRAMUSCULAR | Status: DC | PRN
  Administered 2018-05-30 (×2): 15 mg via INTRAVENOUS

## 2018-05-30 MED ORDER — OXYCODONE HCL 5 MG PO TABS
5.0000 mg | ORAL_TABLET | Freq: Once | ORAL | Status: AC | PRN
  Administered 2018-05-30: 5 mg via ORAL

## 2018-05-30 MED ORDER — CHLORHEXIDINE GLUCONATE CLOTH 2 % EX PADS: 6.0000 | MEDICATED_PAD | Freq: Once | CUTANEOUS | Status: DC

## 2018-05-30 MED ORDER — OXYCODONE HCL 5 MG/5ML PO SOLN: 5.0000 mg | Freq: Once | ORAL | Status: AC | PRN

## 2018-05-30 MED ORDER — OXYCODONE HCL 5 MG PO TABS
ORAL_TABLET | ORAL | Status: AC
  Filled 2018-05-30: qty 1

## 2018-05-30 MED ORDER — DEXAMETHASONE SODIUM PHOSPHATE 4 MG/ML IJ SOLN
INTRAMUSCULAR | Status: DC | PRN
  Administered 2018-05-30: 5 mg via INTRAVENOUS

## 2018-05-30 MED ORDER — FENTANYL CITRATE (PF) 100 MCG/2ML IJ SOLN
25.0000 ug | INTRAMUSCULAR | Status: DC | PRN
  Administered 2018-05-30: 25 ug via INTRAVENOUS
  Administered 2018-05-30: 50 ug via INTRAVENOUS
  Administered 2018-05-30: 25 ug via INTRAVENOUS

## 2018-05-30 MED ORDER — ONDANSETRON HCL 4 MG/2ML IJ SOLN: 4.0000 mg | Freq: Once | INTRAMUSCULAR | Status: DC | PRN

## 2018-05-30 MED ORDER — PHENYLEPHRINE 40 MCG/ML (10ML) SYRINGE FOR IV PUSH (FOR BLOOD PRESSURE SUPPORT)
PREFILLED_SYRINGE | INTRAVENOUS | Status: AC
  Filled 2018-05-30: qty 10

## 2018-05-30 MED ORDER — MIDAZOLAM HCL 2 MG/2ML IJ SOLN
1.0000 mg | INTRAMUSCULAR | Status: DC | PRN
  Administered 2018-05-30: 2 mg via INTRAVENOUS

## 2018-05-30 MED ORDER — FENTANYL CITRATE (PF) 100 MCG/2ML IJ SOLN
INTRAMUSCULAR | Status: AC
  Filled 2018-05-30: qty 2

## 2018-05-30 MED ORDER — FENTANYL CITRATE (PF) 100 MCG/2ML IJ SOLN
50.0000 ug | INTRAMUSCULAR | Status: DC | PRN
  Administered 2018-05-30: 50 ug via INTRAVENOUS
  Administered 2018-05-30: 100 ug via INTRAVENOUS

## 2018-05-30 MED ORDER — DEXAMETHASONE SODIUM PHOSPHATE 10 MG/ML IJ SOLN
INTRAMUSCULAR | Status: AC
  Filled 2018-05-30: qty 1

## 2018-05-30 MED ORDER — BUPIVACAINE-EPINEPHRINE (PF) 0.25% -1:200000 IJ SOLN
INTRAMUSCULAR | Status: DC | PRN
  Administered 2018-05-30: 7 mL

## 2018-05-30 MED ORDER — CEFAZOLIN SODIUM-DEXTROSE 2-4 GM/100ML-% IV SOLN
2.0000 g | INTRAVENOUS | Status: AC
  Administered 2018-05-30: 2 g via INTRAVENOUS

## 2018-05-30 MED ORDER — GABAPENTIN 300 MG PO CAPS
ORAL_CAPSULE | ORAL | Status: AC
  Filled 2018-05-30: qty 1

## 2018-05-30 MED ORDER — TRAMADOL HCL 50 MG PO TABS: 50.0000 mg | ORAL_TABLET | Freq: Four times a day (QID) | ORAL | 1 refills | Status: DC | PRN

## 2018-05-30 MED ORDER — GABAPENTIN 300 MG PO CAPS
300.0000 mg | ORAL_CAPSULE | ORAL | Status: AC
  Administered 2018-05-30: 300 mg via ORAL

## 2018-05-30 MED ORDER — LACTATED RINGERS IV SOLN
INTRAVENOUS | Status: DC
  Administered 2018-05-30 (×2): via INTRAVENOUS

## 2018-05-30 MED ORDER — SUCCINYLCHOLINE CHLORIDE 200 MG/10ML IV SOSY
PREFILLED_SYRINGE | INTRAVENOUS | Status: AC
  Filled 2018-05-30: qty 10

## 2018-05-30 MED ORDER — TECHNETIUM TC 99M SULFUR COLLOID FILTERED
1.0000 | Freq: Once | INTRAVENOUS | Status: AC | PRN
  Administered 2018-05-30: 1 via INTRADERMAL

## 2018-05-30 MED ORDER — ROPIVACAINE HCL 5 MG/ML IJ SOLN
INTRAMUSCULAR | Status: DC | PRN
  Administered 2018-05-30: 30 mL via PERINEURAL

## 2018-05-30 MED ORDER — ONDANSETRON HCL 4 MG/2ML IJ SOLN
INTRAMUSCULAR | Status: AC
  Filled 2018-05-30: qty 2

## 2018-05-30 MED ORDER — LIDOCAINE HCL (CARDIAC) PF 100 MG/5ML IV SOSY
PREFILLED_SYRINGE | INTRAVENOUS | Status: DC | PRN
  Administered 2018-05-30: 50 mg via INTRAVENOUS

## 2018-05-30 MED ORDER — ACETAMINOPHEN 500 MG PO TABS
ORAL_TABLET | ORAL | Status: AC
  Filled 2018-05-30: qty 2

## 2018-05-30 MED ORDER — EPHEDRINE 5 MG/ML INJ
INTRAVENOUS | Status: AC
  Filled 2018-05-30: qty 10

## 2018-05-30 MED ORDER — 0.9 % SODIUM CHLORIDE (POUR BTL) OPTIME
TOPICAL | Status: DC | PRN
  Administered 2018-05-30: 1000 mL

## 2018-05-30 MED ORDER — PROPOFOL 10 MG/ML IV BOLUS
INTRAVENOUS | Status: DC | PRN
  Administered 2018-05-30: 150 mg via INTRAVENOUS

## 2018-05-30 SURGICAL SUPPLY — 52 items
ADH SKN CLS APL DERMABOND .7 (GAUZE/BANDAGES/DRESSINGS) ×1
APL PRP STRL LF DISP 70% ISPRP (MISCELLANEOUS) ×1
APL SKNCLS STERI-STRIP NONHPOA (GAUZE/BANDAGES/DRESSINGS)
BENZOIN TINCTURE PRP APPL 2/3 (GAUZE/BANDAGES/DRESSINGS) IMPLANT
BINDER BREAST LRG (GAUZE/BANDAGES/DRESSINGS) ×1 IMPLANT
BINDER BREAST MEDIUM (GAUZE/BANDAGES/DRESSINGS) IMPLANT
BINDER BREAST XLRG (GAUZE/BANDAGES/DRESSINGS) IMPLANT
BINDER BREAST XXLRG (GAUZE/BANDAGES/DRESSINGS) IMPLANT
BLADE SURG 15 STRL LF DISP TIS (BLADE) ×1 IMPLANT
BLADE SURG 15 STRL SS (BLADE) ×2
CANISTER SUC SOCK COL 7IN (MISCELLANEOUS) IMPLANT
CANISTER SUCT 1200ML W/VALVE (MISCELLANEOUS) ×2 IMPLANT
CHLORAPREP W/TINT 26 (MISCELLANEOUS) ×2 IMPLANT
CLIP VESOCCLUDE SM WIDE 6/CT (CLIP) ×2 IMPLANT
COVER BACK TABLE REUSABLE LG (DRAPES) ×2 IMPLANT
COVER MAYO STAND REUSABLE (DRAPES) ×2 IMPLANT
COVER PROBE W GEL 5X96 (DRAPES) ×2 IMPLANT
COVER WAND RF STERILE (DRAPES) IMPLANT
DECANTER SPIKE VIAL GLASS SM (MISCELLANEOUS) IMPLANT
DERMABOND ADVANCED (GAUZE/BANDAGES/DRESSINGS) ×1
DERMABOND ADVANCED .7 DNX12 (GAUZE/BANDAGES/DRESSINGS) ×1 IMPLANT
DRAPE LAPAROSCOPIC ABDOMINAL (DRAPES) ×2 IMPLANT
DRAPE UTILITY XL STRL (DRAPES) ×2 IMPLANT
DRSG PAD ABDOMINAL 8X10 ST (GAUZE/BANDAGES/DRESSINGS) ×1 IMPLANT
ELECT COATED BLADE 2.86 ST (ELECTRODE) ×2 IMPLANT
ELECT REM PT RETURN 9FT ADLT (ELECTROSURGICAL) ×2
ELECTRODE REM PT RTRN 9FT ADLT (ELECTROSURGICAL) ×1 IMPLANT
GAUZE SPONGE 4X4 12PLY STRL (GAUZE/BANDAGES/DRESSINGS) ×3 IMPLANT
GLOVE SURG SIGNA 7.5 PF LTX (GLOVE) ×4 IMPLANT
GOWN STRL REUS W/ TWL LRG LVL3 (GOWN DISPOSABLE) ×1 IMPLANT
GOWN STRL REUS W/ TWL XL LVL3 (GOWN DISPOSABLE) ×1 IMPLANT
GOWN STRL REUS W/TWL LRG LVL3 (GOWN DISPOSABLE)
GOWN STRL REUS W/TWL XL LVL3 (GOWN DISPOSABLE) ×4
KIT MARKER MARGIN INK (KITS) ×2 IMPLANT
NDL HYPO 25X1 1.5 SAFETY (NEEDLE) ×1 IMPLANT
NDL SAFETY ECLIPSE 18X1.5 (NEEDLE) IMPLANT
NEEDLE HYPO 18GX1.5 SHARP (NEEDLE)
NEEDLE HYPO 25X1 1.5 SAFETY (NEEDLE) ×2 IMPLANT
NS IRRIG 1000ML POUR BTL (IV SOLUTION) ×2 IMPLANT
PACK BASIN DAY SURGERY FS (CUSTOM PROCEDURE TRAY) ×2 IMPLANT
PENCIL BUTTON HOLSTER BLD 10FT (ELECTRODE) ×2 IMPLANT
SHEET MEDIUM DRAPE 40X70 STRL (DRAPES) ×2 IMPLANT
SLEEVE SCD COMPRESS KNEE MED (MISCELLANEOUS) ×2 IMPLANT
SPONGE LAP 18X18 RF (DISPOSABLE) ×2 IMPLANT
STRIP CLOSURE SKIN 1/2X4 (GAUZE/BANDAGES/DRESSINGS) IMPLANT
SUT MNCRL AB 4-0 PS2 18 (SUTURE) ×2 IMPLANT
SUT VICRYL 3-0 CR8 SH (SUTURE) ×2 IMPLANT
SYR CONTROL 10ML LL (SYRINGE) ×2 IMPLANT
TOWEL GREEN STERILE FF (TOWEL DISPOSABLE) ×2 IMPLANT
TRAY FAXITRON CT DISP (TRAY / TRAY PROCEDURE) ×2 IMPLANT
TUBE CONNECTING 20X1/4 (TUBING) ×2 IMPLANT
YANKAUER SUCT BULB TIP NO VENT (SUCTIONS) ×2 IMPLANT

## 2018-05-30 NOTE — Transfer of Care (Signed)
Immediate Anesthesia Transfer of Care Note  Patient: Jaclyn Schmidt  Procedure(s) Performed: RIGHT BREAST LUMPECTOMY WITH RADIOACTIVE SEED AND RIGHT AXILLARY SENTINEL LYMPH NODE BIOPSY (Right Breast)  Patient Location: PACU  Anesthesia Type:GA combined with regional for post-op pain  Level of Consciousness: awake, alert  and oriented  Airway & Oxygen Therapy: Patient Spontanous Breathing and Patient connected to nasal cannula oxygen  Post-op Assessment: Report given to RN and Post -op Vital signs reviewed and stable  Post vital signs: Reviewed and stable  Last Vitals:  Vitals Value Taken Time  BP 129/74 05/30/2018  2:27 PM  Temp    Pulse 88 05/30/2018  2:28 PM  Resp 12 05/30/2018  2:28 PM  SpO2 100 % 05/30/2018  2:28 PM  Vitals shown include unvalidated device data.  Last Pain:  Vitals:   05/30/18 1052  TempSrc: Oral  PainSc: 0-No pain         Complications: No apparent anesthesia complications

## 2018-05-30 NOTE — Progress Notes (Signed)
Assisted Dr. Witman with right, ultrasound guided, pectoralis block. Side rails up, monitors on throughout procedure. See vital signs in flow sheet. Tolerated Procedure well. 

## 2018-05-30 NOTE — Discharge Instructions (Signed)
[facility] [place] [facility] - DISCHARGE INSTRUCTIONS TO PATIENT  Activity:  Driving - May drive in 2 to 3 days, if doing well and off pain meds   Lifting - No lifting more than 15 pounds  Wound Care:   Leave the incision dry for 2 days, then you may shower  Diet:  As tolerated  Follow up appointment:  Call Dr. Pollie Friar office [facility]) at [phone] for an appointment in 2 to 3 weeks..         We are doing "e" visits post op during this Covid-19 virus epidemic, our office will contact you about this arrangement.  If you have not heard from our office, call our office the day before your scheduled visit to make plans for your visit.  Medications and dosages:  Resume your home medications.  You have a prescription for:  Ultram  Call Dr. Lucia Gaskins or his office  [phone]) if you have:  Temperature greater than 100.4,  Persistent nausea and vomiting,  Severe uncontrolled pain,  Redness, tenderness, or signs of infection (pain, swelling, redness, odor or green/yellow discharge around the site),  Difficulty breathing, headache or visual disturbances,  Any other questions or concerns you may have after discharge.  In an emergency, call 911 or go to an Emergency Department at a nearby hospital.    Regional Anesthesia Blocks  1. Numbness or the inability to move the "blocked" extremity may last from 3-48 hours after placement. The length of time depends on the medication injected and your individual response to the medication. If the numbness is not going away after 48 hours, call your surgeon.  2. The extremity that is blocked will need to be protected until the numbness is gone and the  Strength has returned. Because you cannot feel it, you will need to take extra care to avoid injury. Because it may be weak, you may have difficulty moving it or using it. You may not know what position it is in without looking at it while the block is in effect.  3. For blocks in  the legs and feet, returning to weight bearing and walking needs to be done carefully. You will need to wait until the numbness is entirely gone and the strength has returned. You should be able to move your leg and foot normally before you try and bear weight or walk. You will need someone to be with you when you first try to ensure you do not fall and possibly risk injury.  4. Bruising and tenderness at the needle site are common side effects and will resolve in a few days.  5. Persistent numbness or new problems with movement should be communicated to the surgeon or the [place] [phone] [place] [phone]).   Post Anesthesia Home Care Instructions  Activity: Get plenty of rest for the remainder of the day. A responsible individual must stay with you for 24 hours following the procedure.  For the next 24 hours, DO NOT: -Drive a car -Paediatric nurse -Drink alcoholic beverages -Take any medication unless instructed by your physician -Make any legal decisions or sign important papers.  Meals: Start with liquid foods such as gelatin or soup. Progress to regular foods as tolerated. Avoid greasy, spicy, heavy foods. If nausea and/or vomiting occur, drink only clear liquids until the nausea and/or vomiting subsides. Call your physician if vomiting continues.  Special Instructions/Symptoms: Your throat may feel dry or sore from the anesthesia or the breathing tube placed  in your throat during surgery. If this causes discomfort, gargle with warm salt water. The discomfort should disappear within 24 hours.  If you had a scopolamine patch placed behind your ear for the management of post- operative nausea and/or vomiting:  1. The medication in the patch is effective for 72 hours, after which it should be removed.  Wrap patch in a tissue and discard in the trash. Wash hands thoroughly with soap and water. 2. You may remove the patch earlier than 72 hours  if you experience unpleasant side effects which may include dry mouth, dizziness or visual disturbances. 3. Avoid touching the patch. Wash your hands with soap and water after contact with the patch.

## 2018-05-30 NOTE — Anesthesia Postprocedure Evaluation (Signed)
Anesthesia Post Note  Patient: Jaclyn Schmidt  Procedure(s) Performed: RIGHT BREAST LUMPECTOMY WITH RADIOACTIVE SEED AND RIGHT AXILLARY SENTINEL LYMPH NODE BIOPSY (Right Breast)     Patient location during evaluation: PACU Anesthesia Type: General Level of consciousness: awake and alert Pain management: pain level controlled Vital Signs Assessment: post-procedure vital signs reviewed and stable Respiratory status: spontaneous breathing, nonlabored ventilation and respiratory function stable Cardiovascular status: blood pressure returned to baseline and stable Postop Assessment: no apparent nausea or vomiting Anesthetic complications: no    Last Vitals:  Vitals:   05/30/18 1500 05/30/18 1515  BP: 127/73   Pulse: 85 78  Resp: 14 (!) 8  Temp:    SpO2: 98% 95%    Last Pain:  Vitals:   05/30/18 1515  TempSrc:   PainSc: [place] Brock

## 2018-05-30 NOTE — Interval H&P Note (Signed)
History and Physical Interval Note:  05/30/2018 12:44 PM  Jaclyn Schmidt  has presented today for surgery, with the diagnosis of RIGHT BREAST CANCER.  The various methods of treatment have been discussed with the patient and family.  Sister and daughter in law, Clyde Canterbury, with patient today.  After consideration of risks, benefits and other options for treatment, the patient has consented to  Procedure(s): RIGHT BREAST LUMPECTOMY WITH RADIOACTIVE SEED AND RIGHT AXILLARY SENTINEL LYMPH NODE BIOPSY (Right) as a surgical intervention.  The patient's history has been reviewed, patient examined, no change in status, stable for surgery.  I have reviewed the patient's chart and labs.  Questions were answered to the patient's satisfaction.     Shann Medal

## 2018-05-30 NOTE — Anesthesia Procedure Notes (Signed)
Procedure Name: LMA Insertion Date/Time: 05/30/2018 1:01 PM Performed by: Willa Frater, CRNA Pre-anesthesia Checklist: Patient identified, Emergency Drugs available, Suction available and Patient being monitored Patient Re-evaluated:Patient Re-evaluated prior to induction Oxygen Delivery Method: Circle system utilized Preoxygenation: Pre-oxygenation with 100% oxygen Induction Type: IV induction Ventilation: Mask ventilation without difficulty LMA: LMA inserted LMA Size: 3.0 Number of attempts: 1 Airway Equipment and Method: Bite block Placement Confirmation: positive ETCO2 Tube secured with: Tape Dental Injury: Teeth and Oropharynx as per pre-operative assessment

## 2018-05-30 NOTE — Anesthesia Procedure Notes (Signed)
Anesthesia Regional Block: Pectoralis block   Pre-Anesthetic Checklist: ,, timeout performed, Correct Patient, Correct Site, Correct Laterality, Correct Procedure, Correct Position, site marked, Risks and benefits discussed,  Surgical consent,  Pre-op evaluation,  At surgeon's request and post-op pain management  Laterality: Right  Prep: chloraprep       Needles:  Injection technique: Single-shot  Needle Type: Echogenic Stimulator Needle     Needle Length: 9cm  Needle Gauge: 21     Additional Needles:   Procedures:,,,, ultrasound used (permanent image in chart),,,,  Narrative:  Start time: 05/30/2018 12:00 PM End time: 05/30/2018 12:05 PM Injection made incrementally with aspirations every 5 mL.  Performed by: Personally  Anesthesiologist: Lidia Collum, MD  Additional Notes: Monitors applied. Injection made in 5cc increments. No resistance to injection. Good needle visualization. Patient tolerated procedure well.

## 2018-05-30 NOTE — Progress Notes (Signed)
Assisted Pamala Hurry, nuc med tech, with nuc med injections. Side rails up, monitors on throughout procedure. See vital signs in flow sheet. Tolerated Procedure well.

## 2018-05-30 NOTE — Op Note (Signed)
05/30/2018  2:21 PM  PATIENT:  Jaclyn Schmidt DOB: 10/04/1950 MRN: [id number]  PREOP DIAGNOSIS:   RIGHT BREAST CANCER  POSTOP DIAGNOSIS:    Right breast cancer, 6 o'clock position (T1, N0)  PROCEDURE:   Procedure(s):  RIGHT BREAST LUMPECTOMY WITH RADIOACTIVE SEED AND RIGHT AXILLARY SENTINEL LYMPH NODE BIOPSY, deep sentinel lymph node biopsy  SURGEON:   Alphonsa Overall, M.D.  ANESTHESIA:   General  Anesthesiologist: Audry Pili, MD; Lidia Collum, MD CRNA: Willa Frater, CRNA  General  EBL:  Minimal  ml  DRAINS:  none   LOCAL MEDICATIONS USED:   20 cc 1/4% marcaine, she had a right pectoral block by anesthesia  SPECIMEN:   Right breast lumpectomy (6 color paint), medial margin, sentinel lymph node (counts - 600, background - 5)  COUNTS CORRECT:  YES  INDICATIONS FOR PROCEDURE:  Jaclyn PANGILINAN is a 68 y.o. (DOB: 11/01/1950) white female whose primary care physician is [name], PA-C and comes for right breast lumpectomy and right axillary sentinel lymph node biopsy.    She has seen Dr. London Pepper for oncology and was placed on an anithormone med.  Her surgery was delayed because of the coronavirus pandemic.  The options for breast cancer treatment have been discussed with the patient. She elected to proceed with lumpectomy and axillary sentinel lymph node.     The indications and potential complications of surgery were explained to the patient. Potential complications include, but are not limited to, bleeding, infection, the need for further surgery, and nerve injury.     She had a I131 seed placed on 05/29/2018 in her right breast at [facility].  The seed is in the 6 o'clock position of the right breast.   In the holding area, her right areola was injected with 1 millicurie of Technitium Sulfur Colloid.  OPERATIVE NOTE:   The patient was taken to operating room # 2 at [facility] where she underwent a general anesthesia  supervised by Anesthesiologist:  Audry Pili, MD; Lidia Collum, MD CRNA: Willa Frater, CRNA. Her right breast and axilla were prepped with  ChloraPrep and sterilely draped.    A time-out and the surgical check list was reviewed.    I turned attention to the cancer which was about at the 6 o'clock position of the right breast.   I used the Neoprobe to identify the I131 seed.  I made an inferiorly based right circumareolar incision.  I tried to excise an area around the tumor of at least 1 cm.    I excised this block of breast tissue approximately 4 cm by 4 cm  in diameter.   I painted the lumpectomy specimen with the 6 color paint kit and did a specimen mammogram which confirmed the mass, clip, and the seed were all in the right position in the specimen.  The specimen was sent to pathology who called back to confirm that they have the seed and the specimen.   I excised an additional medial margin and painted this margin.   I then started the right deep axillary sentinel lymph node biopsy. I made an incision in the right axilla.  I found a hot area at the junction of the breast and the pectoralis major muscle, deep in the axilla. I cut down and  identified a hot node that had counts of 600 and the background has 5 counts.    I checked her internal mammary nodes and  supraclavicular nodes with the neoprobe and found no other hot area. The axillary node was then sent to pathology.    I then irrigated the wound with saline. I infiltrated approximately 20 mL of 1/4% Marcaine between the incisions. I placed 4 clips to mark biopsy cavity, at 12, 3, 6, and 9 o'clock.  I then closed all the wounds in layers using 3-0 Vicryl sutures for the deep layer. At the skin, I closed the incisions with a 4-0 Monocryl suture. The incisions were then painted with Dermabond.  She had gauze place over the wounds and placed in a breast binder.   The patient tolerated the procedure well, was transported to the recovery room in good condition. Sponge  and needle count were correct at the end of the case.   Final pathology is pending.   Alphonsa Overall, MD, [facility] Pager: [phone] Office phone:  [phone]

## 2018-05-30 NOTE — Anesthesia Preprocedure Evaluation (Addendum)
Anesthesia Evaluation  Patient identified by MRN, date of birth, ID band Patient awake    Reviewed: Allergy & Precautions, NPO status , Patient's Chart, lab work & pertinent test results  History of Anesthesia Complications Negative for: history of anesthetic complications  Airway Mallampati: II  TM Distance: >3 FB Neck ROM: Limited    Dental no notable dental hx. (+) Teeth Intact   Pulmonary neg pulmonary ROS, former smoker,    Pulmonary exam normal        Cardiovascular negative cardio ROS Normal cardiovascular exam     Neuro/Psych PSYCHIATRIC DISORDERS Anxiety Depression    GI/Hepatic Neg liver ROS, GERD  ,  Endo/Other  Hypothyroidism   Renal/GU negative Renal ROS  negative genitourinary   Musculoskeletal negative musculoskeletal ROS (+)   Abdominal   Peds  Hematology negative hematology ROS (+)   Anesthesia Other Findings   Reproductive/Obstetrics                            Anesthesia Physical Anesthesia Plan  ASA: II  Anesthesia Plan: General   Post-op Pain Management: GA combined w/ Regional for post-op pain   Induction:   PONV Risk Score and Plan: 3 and Ondansetron, Dexamethasone, Midazolam and Treatment may vary due to age or medical condition  Airway Management Planned: LMA  Additional Equipment: None  Intra-op Plan:   Post-operative Plan: Extubation in OR  Informed Consent: I have reviewed the patients History and Physical, chart, labs and discussed the procedure including the risks, benefits and alternatives for the proposed anesthesia with the patient or authorized representative who has indicated his/her understanding and acceptance.     Dental advisory given  Plan Discussed with:   Anesthesia Plan Comments:        Anesthesia Quick Evaluation

## 2018-05-31 ENCOUNTER — Encounter (HOSPITAL_BASED_OUTPATIENT_CLINIC_OR_DEPARTMENT_OTHER): Payer: Self-pay | Admitting: Surgery

## 2018-05-31 NOTE — Addendum Note (Signed)
Addendum  created 05/31/18 0715 by Willa Frater, CRNA   Charge Capture section accepted

## 2018-05-31 NOTE — Addendum Note (Signed)
Addendum  created 05/31/18 0932 by Tawni Millers, CRNA   Charge Capture section accepted

## 2018-06-05 ENCOUNTER — Other Ambulatory Visit: Payer: Self-pay

## 2018-06-06 ENCOUNTER — Encounter: Payer: Self-pay | Admitting: Physician Assistant

## 2018-06-06 ENCOUNTER — Ambulatory Visit (INDEPENDENT_AMBULATORY_CARE_PROVIDER_SITE_OTHER): Payer: Medicare HMO | Admitting: Physician Assistant

## 2018-06-06 VITALS — BP 125/89 | HR 75 | Temp 97.4°F | Ht 63.0 in | Wt 148.6 lb

## 2018-06-06 DIAGNOSIS — E039 Hypothyroidism, unspecified: Secondary | ICD-10-CM

## 2018-06-06 DIAGNOSIS — C50311 Malignant neoplasm of lower-inner quadrant of right female breast: Secondary | ICD-10-CM

## 2018-06-06 DIAGNOSIS — Z0000 Encounter for general adult medical examination without abnormal findings: Secondary | ICD-10-CM | POA: Diagnosis not present

## 2018-06-06 DIAGNOSIS — R69 Illness, unspecified: Secondary | ICD-10-CM | POA: Diagnosis not present

## 2018-06-06 DIAGNOSIS — F339 Major depressive disorder, recurrent, unspecified: Secondary | ICD-10-CM

## 2018-06-06 DIAGNOSIS — Z170 Estrogen receptor positive status [ER+]: Secondary | ICD-10-CM | POA: Diagnosis not present

## 2018-06-06 MED ORDER — PRAVASTATIN SODIUM 40 MG PO TABS: 40.0000 mg | ORAL_TABLET | Freq: Every day | ORAL | 3 refills | Status: DC

## 2018-06-06 MED ORDER — GABAPENTIN 300 MG PO CAPS: 900.0000 mg | ORAL_CAPSULE | Freq: Two times a day (BID) | ORAL | 5 refills | Status: DC

## 2018-06-06 MED ORDER — SERTRALINE HCL 50 MG PO TABS: 50.0000 mg | ORAL_TABLET | Freq: Every day | ORAL | 3 refills | Status: DC

## 2018-06-06 MED ORDER — GABAPENTIN 300 MG PO CAPS: 900.0000 mg | ORAL_CAPSULE | Freq: Every day | ORAL | 1 refills | Status: DC

## 2018-06-06 NOTE — Progress Notes (Signed)
BP 125/89   Pulse 75   Temp (!) 97.4 F (36.3 C) (Oral)   Ht _0  (1.6 m)   Wt 148 lb 9.6 oz (67.4 kg)   BMI 26.32 kg/m    Subjective:    Patient ID: Jaclyn Schmidt, female    DOB: 03-19-1950, 68 y.o.   MRN: [id number]  HPI: Jaclyn Schmidt is a 68 y.o. female presenting on 06/06/2018 for Hypothyroidism (6 month follow up ) and Hyperlipidemia  This patient comes in for periodic recheck on her chronic medical conditions.  They do include hypothyroidism and hyperlipidemia.  We will have labs performed today.  Patient also has a diagnosis of breast cancer.  She is 1 week out from having surgery.  They removed the cancer with a lumpectomy and her 3 lymph nodes were normal.  She is having a lot of chest wall pain in the healing process.  She had been on gabapentin 600 mg at bedtime for her lumbar neuropathy.  Some can increase this to 900 mg to see if this can help her neuropathy in her chest wall down.  She goes for radiation oncology soon and then possible medical oncology.  She has not gotten the result No typing of her cancer.  She has actually gone back to work.  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  And she states that this has helped her mentally.  She does not have any other complaints at this time.  An order for her general labs will be placed.  Past Medical History:  Diagnosis Date  . Anxiety   . Cancer ([place]) 04/2018   right breast cancer  . Cholelithiasis   . Depression   . GERD (gastroesophageal reflux disease)   . History of kidney stones   . Hyperlipidemia   . Hypothyroidism   . Irritable bowel syndrome   . Pneumonia   . Thyroid disease   . Vitamin D deficiency    Relevant past medical, surgical, family and social history reviewed and updated as indicated. Interim medical history since our last visit reviewed. Allergies and medications reviewed and updated. DATA REVIEWED: CHART IN EPIC  Family History reviewed for pertinent findings.  Review of Systems   Constitutional: Negative.   HENT: Negative.   Eyes: Negative.   Respiratory: Negative.   Cardiovascular: Positive for chest pain.  Gastrointestinal: Negative.   Genitourinary: Negative.   Musculoskeletal: Positive for arthralgias and myalgias.    Allergies as of 06/06/2018      Reactions   Penicillins Rash, Other (See Comments)   PATIENT HAS HAD A PCN REACTION WITH IMMEDIATE RASH, FACIAL/TONGUE/THROAT SWELLING, SOB, OR LIGHTHEADEDNESS WITH HYPOTENSION:  #  #  #  YES  #  #  #   Has patient had a PCN reaction causing severe rash involving mucus membranes or skin necrosis: No Has patient had a PCN reaction that required hospitalization: No Has patient had a PCN reaction occurring within the last 10 years: No If all of the above answers are "NO", then may proceed with Cephalosporin use.      Medication List       Accurate as of Jun 06, 2018  9:22 AM. If you have any questions, ask your nurse or doctor.        STOP taking these medications   traMADol 50 MG tablet Commonly known as:  ULTRAM Stopped by:  [name], PA-C     TAKE these medications   ALPRAZolam 1  MG tablet Commonly known as:  XANAX Take 0.5 tablets (0.5 mg total) by mouth 3 (three) times daily. What changed:    how much to take  when to take this  reasons to take this   BIOTIN PO Take 2 tablets by mouth daily.   cetirizine 10 MG tablet Commonly known as:  ZYRTEC Take 10 mg by mouth daily.   fluticasone 50 MCG/ACT nasal spray Commonly known as:  FLONASE Place 2 sprays into both nostrils daily as needed (FOR ALLERGIES.).   gabapentin 300 MG capsule Commonly known as:  NEURONTIN Take 3 capsules (900 mg total) by mouth at bedtime. What changed:    how much to take  when to take this Changed by:  [name], PA-C   HYDROcodone-acetaminophen 5-325 MG tablet Commonly known as:  NORCO/VICODIN TAKE 1 (ONE) TABLET EVERY SIX HOURS, AS NEEDED   letrozole 2.5 MG tablet Commonly known as:   FEMARA Take 2.5 mg by mouth daily.   levothyroxine 75 MCG tablet Commonly known as:  SYNTHROID Take 1 tablet (75 mcg total) by mouth daily.   pravastatin 40 MG tablet Commonly known as:  PRAVACHOL Take 1 tablet (40 mg total) by mouth daily.   sertraline 50 MG tablet Commonly known as:  ZOLOFT Take 1 tablet (50 mg total) by mouth daily. (Needs to be seen before next refill)   VITAMIN D3 PO Take 4,000 Int'l Units by mouth daily.          Objective:    BP 125/89   Pulse 75   Temp (!) 97.4 F (36.3 C) (Oral)   Ht _0  (1.6 m)   Wt 148 lb 9.6 oz (67.4 kg)   BMI 26.32 kg/m   Allergies  Allergen Reactions  . Penicillins Rash and Other (See Comments)    PATIENT HAS HAD A PCN REACTION WITH IMMEDIATE RASH, FACIAL/TONGUE/THROAT SWELLING, SOB, OR LIGHTHEADEDNESS WITH HYPOTENSION:  #  #  #  YES  #  #  #   Has patient had a PCN reaction causing severe rash involving mucus membranes or skin necrosis: No Has patient had a PCN reaction that required hospitalization: No Has patient had a PCN reaction occurring within the last 10 years: No If all of the above answers are "NO", then may proceed with Cephalosporin use.     Wt Readings from Last 3 Encounters:  06/06/18 148 lb 9.6 oz (67.4 kg)  05/30/18 149 lb 0.5 oz (67.6 kg)  02/23/18 150 lb (68 kg)    Physical Exam Constitutional:      Appearance: She is well-developed.  HENT:     Head: Normocephalic and atraumatic.  Eyes:     Conjunctiva/sclera: Conjunctivae normal.     Pupils: Pupils are equal, round, and reactive to light.  Cardiovascular:     Rate and Rhythm: Normal rate and regular rhythm.     Heart sounds: Normal heart sounds.  Pulmonary:     Effort: Pulmonary effort is normal.     Breath sounds: Normal breath sounds.  Abdominal:     General: Bowel sounds are normal.     Palpations: Abdomen is soft.  Musculoskeletal:     Right shoulder: She exhibits tenderness and pain.       Arms:  Skin:    General: Skin is  warm and dry.     Findings: No rash.  Neurological:     Mental Status: She is alert and oriented to person, place, and time.  Deep Tendon Reflexes: Reflexes are normal and symmetric.  Psychiatric:        Behavior: Behavior normal.        Thought Content: Thought content normal.        Judgment: Judgment normal.     Results for orders placed or performed during the hospital encounter of 05/26/18  Novel Coronavirus, NAA (hospital order; send-out to ref lab)  Result Value Ref Range   SARS-CoV-2, NAA NOT DETECTED NOT DETECTED   Coronavirus Source NASOPHARYNGEAL       Assessment & Plan:   1. Depression, recurrent ([place]) - sertraline (ZOLOFT) 50 MG tablet; Take 1 tablet (50 mg total) by mouth daily. (Needs to be seen before next refill)  Dispense: 90 tablet; Refill: 3  2. Well adult exam - pravastatin (PRAVACHOL) 40 MG tablet; Take 1 tablet (40 mg total) by mouth daily.  Dispense: 90 tablet; Refill: 3 - CBC with Differential/Platelet - CMP14+EGFR - TSH - Lipid panel  3. Hypothyroidism, unspecified type - TSH  4. Malignant neoplasm of lower-inner quadrant of right breast of female, estrogen receptor positive ([place]) Continue treatment with oncology and surgery   Continue all other maintenance medications as listed above.  Follow up plan: Return in about 6 months (around 12/07/2018).  Educational handout given for [place] PA-C [facility] [address]  [place], [place] [address] [phone]   06/06/2018, 9:22 AM

## 2018-06-07 LAB — CBC WITH DIFFERENTIAL/PLATELET
Basophils Absolute: 0.1 10*3/uL (ref 0.0–0.2)
Basos: 1 %
EOS (ABSOLUTE): 0.6 10*3/uL — ABNORMAL HIGH (ref 0.0–0.4)
Eos: 10 %
Hematocrit: 41.6 % (ref 34.0–46.6)
Hemoglobin: 14.2 g/dL (ref 11.1–15.9)
Immature Grans (Abs): 0.1 10*3/uL (ref 0.0–0.1)
Immature Granulocytes: 2 %
Lymphocytes Absolute: 2.4 10*3/uL (ref 0.7–3.1)
Lymphs: 36 %
MCH: 30.3 pg (ref 26.6–33.0)
MCHC: 34.1 g/dL (ref 31.5–35.7)
MCV: 89 fL (ref 79–97)
Monocytes Absolute: 0.9 10*3/uL (ref 0.1–0.9)
Monocytes: 14 %
Neutrophils Absolute: 2.4 10*3/uL (ref 1.4–7.0)
Neutrophils: 37 %
Platelets: 298 10*3/uL (ref 150–450)
RBC: 4.69 x10E6/uL (ref 3.77–5.28)
RDW: 12.7 % (ref 11.7–15.4)
WBC: 6.3 10*3/uL (ref 3.4–10.8)

## 2018-06-07 LAB — CMP14+EGFR
ALT: 21 IU/L (ref 0–32)
AST: 20 IU/L (ref 0–40)
Albumin/Globulin Ratio: 2 (ref 1.2–2.2)
Albumin: 4.3 g/dL (ref 3.8–4.8)
Alkaline Phosphatase: 87 IU/L (ref 39–117)
BUN/Creatinine Ratio: 16 (ref 12–28)
BUN: 13 mg/dL (ref 8–27)
Bilirubin Total: 0.5 mg/dL (ref 0.0–1.2)
CO2: 20 mmol/L (ref 20–29)
Calcium: 9.5 mg/dL (ref 8.7–10.3)
Chloride: 106 mmol/L (ref 96–106)
Creatinine, Ser: 0.8 mg/dL (ref 0.57–1.00)
GFR calc Af Amer: 88 mL/min/{1.73_m2} (ref >59)
GFR calc non Af Amer: 77 mL/min/{1.73_m2} (ref >59)
Globulin, Total: 2.1 g/dL (ref 1.5–4.5)
Glucose: 102 mg/dL — ABNORMAL HIGH (ref 65–99)
Potassium: 4.7 mmol/L (ref 3.5–5.2)
Sodium: 139 mmol/L (ref 134–144)
Total Protein: 6.4 g/dL (ref 6.0–8.5)

## 2018-06-07 LAB — LIPID PANEL
Chol/HDL Ratio: 4.8 ratio — ABNORMAL HIGH (ref 0.0–4.4)
Cholesterol, Total: 190 mg/dL (ref 100–199)
HDL: 40 mg/dL (ref >39)
LDL Calculated: 109 mg/dL — ABNORMAL HIGH (ref 0–99)
Triglycerides: 204 mg/dL — ABNORMAL HIGH (ref 0–149)
VLDL Cholesterol Cal: 41 mg/dL — ABNORMAL HIGH (ref 5–40)

## 2018-06-07 LAB — TSH: TSH: 0.516 u[IU]/mL (ref 0.450–4.500)

## 2018-06-14 DIAGNOSIS — Z9011 Acquired absence of right breast and nipple: Secondary | ICD-10-CM | POA: Diagnosis not present

## 2018-06-14 DIAGNOSIS — C50411 Malignant neoplasm of upper-outer quadrant of right female breast: Secondary | ICD-10-CM | POA: Diagnosis not present

## 2018-06-14 DIAGNOSIS — Z170 Estrogen receptor positive status [ER+]: Secondary | ICD-10-CM | POA: Diagnosis not present

## 2018-06-14 DIAGNOSIS — C50911 Malignant neoplasm of unspecified site of right female breast: Secondary | ICD-10-CM | POA: Diagnosis not present

## 2018-06-19 DIAGNOSIS — Z170 Estrogen receptor positive status [ER+]: Secondary | ICD-10-CM | POA: Diagnosis not present

## 2018-06-19 DIAGNOSIS — C50511 Malignant neoplasm of lower-outer quadrant of right female breast: Secondary | ICD-10-CM | POA: Diagnosis not present

## 2018-06-21 ENCOUNTER — Ambulatory Visit (INDEPENDENT_AMBULATORY_CARE_PROVIDER_SITE_OTHER): Payer: Medicare HMO | Admitting: Family

## 2018-06-21 ENCOUNTER — Encounter: Payer: Self-pay | Admitting: Family

## 2018-06-21 ENCOUNTER — Other Ambulatory Visit: Payer: Self-pay

## 2018-06-21 VITALS — BP 125/79 | HR 74 | Temp 97.7°F | Ht 63.0 in | Wt 148.0 lb

## 2018-06-21 DIAGNOSIS — S00461A Insect bite (nonvenomous) of right ear, initial encounter: Secondary | ICD-10-CM | POA: Diagnosis not present

## 2018-06-21 DIAGNOSIS — Z170 Estrogen receptor positive status [ER+]: Secondary | ICD-10-CM

## 2018-06-21 DIAGNOSIS — C50311 Malignant neoplasm of lower-inner quadrant of right female breast: Secondary | ICD-10-CM

## 2018-06-21 DIAGNOSIS — W57XXXA Bitten or stung by nonvenomous insect and other nonvenomous arthropods, initial encounter: Secondary | ICD-10-CM | POA: Diagnosis not present

## 2018-06-21 MED ORDER — DOXYCYCLINE HYCLATE 100 MG PO TABS: 100.0000 mg | ORAL_TABLET | Freq: Two times a day (BID) | ORAL | 0 refills | Status: DC

## 2018-06-21 NOTE — Progress Notes (Signed)
Subjective:    Patient ID: Jaclyn Schmidt, female    DOB: 08/30/1950, 68 y.o.   MRN: [id number]  Chief Complaint  Patient presents with  . tick was removed    check for lymes and [place] fever    HPI Pt presents to the office today with a tick bite of her right posterior eye lobe. She states she noticed the tick on Monday and had her neighbor remove it.   She reports her ear is swollen and having tenderness of 7 out 10. Denies any headache, rash, but is having joint pain. She is unsure if this joint pain was caused by her Letrozole. Her Oncologists  has taken her off this medication on Monday because of the joint pain.   She has right breast cancer and had a mastectomy  on 05/30/18.   Review of Systems  Skin:       Right ear swelling and pain  All other systems reviewed and are negative.      Objective:   Physical Exam Vitals signs reviewed.  Constitutional:      General: She is not in acute distress.    Appearance: She is well-developed.  HENT:     Head: Normocephalic and atraumatic.     Right Ear: Swelling and tenderness present.     Left Ear: Tympanic membrane normal.     Ears:     Comments: Right canal with small white pustula, ear lobe mild swelling Eyes:     Pupils: Pupils are equal, round, and reactive to light.  Neck:     Musculoskeletal: Normal range of motion and neck supple.     Thyroid: No thyromegaly.  Cardiovascular:     Rate and Rhythm: Normal rate and regular rhythm.     Heart sounds: Normal heart sounds. No murmur.  Pulmonary:     Effort: Pulmonary effort is normal. No respiratory distress.     Breath sounds: Normal breath sounds. No wheezing.  Abdominal:     General: Bowel sounds are normal. There is no distension.     Palpations: Abdomen is soft.     Tenderness: There is no abdominal tenderness.  Musculoskeletal: Normal range of motion.        General: No tenderness.  Skin:    General: Skin is warm and dry.  Neurological:     Mental  Status: She is alert and oriented to person, place, and time.     Cranial Nerves: No cranial nerve deficit.     Deep Tendon Reflexes: Reflexes are normal and symmetric.  Psychiatric:        Behavior: Behavior normal.        Thought Content: Thought content normal.        Judgment: Judgment normal.       BP 125/79   Pulse 74   Temp 97.7 F (36.5 C) (Oral)   Ht 5\' 3"  (1.6 m)   Wt 148 lb (67.1 kg)   BMI 26.22 kg/m      Assessment & Plan:  Jaclyn Schmidt comes in today with chief complaint of tick was removed (check for lymes and [place] fever)   Diagnosis and orders addressed:  1. Tick bite of right ear, initial encounter - Will start doxycycline given symptoms and current right breast cancer. Discussed risk of photosensitivity.   -Pt to report any new fever, joint pain, or rash -Wear protective clothing while outside- Long sleeves and long pants -Put insect repellent on all exposed skin  and along clothing -Take a shower as soon as possible after being outside RTO if symptoms worsen or do not improve - doxycycline (VIBRA-TABS) 100 MG tablet; Take 1 tablet (100 mg total) by mouth 2 (two) times daily.  Dispense: 42 tablet; Refill: 0  2. Malignant neoplasm of lower-inner quadrant of right breast of female, estrogen receptor positive ([name])     Evelina Dun, FNP

## 2018-06-21 NOTE — Patient Instructions (Signed)

## 2018-06-21 NOTE — Addendum Note (Signed)
Addended by: Shelbie Ammons on: 06/21/2018 12:09 PM   Modules accepted: Orders

## 2018-06-23 DIAGNOSIS — Z510 Encounter for antineoplastic radiation therapy: Secondary | ICD-10-CM | POA: Diagnosis not present

## 2018-06-23 DIAGNOSIS — C50111 Malignant neoplasm of central portion of right female breast: Secondary | ICD-10-CM | POA: Diagnosis not present

## 2018-06-27 DIAGNOSIS — C50111 Malignant neoplasm of central portion of right female breast: Secondary | ICD-10-CM | POA: Diagnosis not present

## 2018-06-27 DIAGNOSIS — Z510 Encounter for antineoplastic radiation therapy: Secondary | ICD-10-CM | POA: Diagnosis not present

## 2018-06-30 DIAGNOSIS — Z510 Encounter for antineoplastic radiation therapy: Secondary | ICD-10-CM | POA: Diagnosis not present

## 2018-06-30 DIAGNOSIS — C50111 Malignant neoplasm of central portion of right female breast: Secondary | ICD-10-CM | POA: Diagnosis not present

## 2018-07-03 ENCOUNTER — Encounter: Payer: Self-pay | Admitting: Family

## 2018-07-03 ENCOUNTER — Other Ambulatory Visit: Payer: Self-pay

## 2018-07-03 ENCOUNTER — Ambulatory Visit (INDEPENDENT_AMBULATORY_CARE_PROVIDER_SITE_OTHER): Payer: Medicare HMO | Admitting: Family

## 2018-07-03 VITALS — BP 116/80 | HR 65 | Temp 97.0°F | Ht 63.0 in | Wt 147.8 lb

## 2018-07-03 DIAGNOSIS — C50111 Malignant neoplasm of central portion of right female breast: Secondary | ICD-10-CM | POA: Diagnosis not present

## 2018-07-03 DIAGNOSIS — Z170 Estrogen receptor positive status [ER+]: Secondary | ICD-10-CM | POA: Diagnosis not present

## 2018-07-03 DIAGNOSIS — W57XXXD Bitten or stung by nonvenomous insect and other nonvenomous arthropods, subsequent encounter: Secondary | ICD-10-CM

## 2018-07-03 DIAGNOSIS — C50311 Malignant neoplasm of lower-inner quadrant of right female breast: Secondary | ICD-10-CM

## 2018-07-03 DIAGNOSIS — Z510 Encounter for antineoplastic radiation therapy: Secondary | ICD-10-CM | POA: Diagnosis not present

## 2018-07-03 DIAGNOSIS — S00461D Insect bite (nonvenomous) of right ear, subsequent encounter: Secondary | ICD-10-CM | POA: Diagnosis not present

## 2018-07-03 DIAGNOSIS — R21 Rash and other nonspecific skin eruption: Secondary | ICD-10-CM | POA: Diagnosis not present

## 2018-07-03 MED ORDER — METHYLPREDNISOLONE ACETATE 80 MG/ML IJ SUSP
80.0000 mg | Freq: Once | INTRAMUSCULAR | Status: AC
  Administered 2018-07-03: 80 mg via INTRAMUSCULAR

## 2018-07-03 MED ORDER — TRIAMCINOLONE ACETONIDE 0.5 % EX OINT: 1.0000 "application " | TOPICAL_OINTMENT | Freq: Two times a day (BID) | CUTANEOUS | 0 refills | Status: DC

## 2018-07-03 NOTE — Progress Notes (Signed)
Subjective:    Patient ID: Jaclyn Schmidt, female    DOB: 02-04-1950, 68 y.o.   MRN: [id number]  Chief Complaint  Patient presents with  . rash with burning and itching    HPI PT presents to the office for a rash that occurred Friday evening. She states she was on day 9 of her doxycyline for a tick bite. She denies sun exposure, headache, or new joint pain.  She states stopped her doxycyline Friday evening. She reports she has had two new spots on Saturday and Sunday. She reports the itching is greatly improved. She has soaked in epsom salt and applied hydrocortisone cream.   She states she starts radiation today for right breast cancer.   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     Review of Systems  Skin: Positive for rash.  All other systems reviewed and are negative.      Objective:   Physical Exam Vitals signs reviewed.  Constitutional:      General: She is not in acute distress.    Appearance: She is well-developed.  HENT:     Head: Normocephalic and atraumatic.  Eyes:     Pupils: Pupils are equal, round, and reactive to light.  Neck:     Musculoskeletal: Normal range of motion and neck supple.     Thyroid: No thyromegaly.  Cardiovascular:     Rate and Rhythm: Normal rate and regular rhythm.     Heart sounds: Normal heart sounds. No murmur.  Pulmonary:     Effort: Pulmonary effort is normal. No respiratory distress.     Breath sounds: Normal breath sounds. No wheezing.  Abdominal:     General: Bowel sounds are normal. There is no distension.     Palpations: Abdomen is soft.     Tenderness: There is no abdominal tenderness.  Musculoskeletal: Normal range of motion.        General: No tenderness.  Skin:    General: Skin is warm and dry.     Findings: Rash (scattered papule rash ) present.       Neurological:     Mental Status: She is alert and oriented to person, place, and time.     Cranial Nerves: No cranial nerve  deficit.     Deep Tendon Reflexes: Reflexes are normal and symmetric.  Psychiatric:        Behavior: Behavior normal.        Thought Content: Thought content normal.        Judgment: Judgment normal.     BP 116/80   Pulse 65   Temp (!) 97 F (36.1 C) (Oral)   Ht 5' 3" (1.6 m)   Wt 147 lb 12.8 oz (67 kg)   BMI 26.18 kg/m        Assessment & Plan:  KESSIE CROSTON comes in today with chief complaint of rash with burning and itching   Diagnosis and orders addressed:  1. Rash and nonspecific skin eruption - triamcinolone ointment (KENALOG) 0.5 %; Apply 1 application topically 2 (two) times daily.  Dispense: 30 g; Refill: 0 - methylPREDNISolone acetate (DEPO-MEDROL) injection 80 mg - BMP8+EGFR  2. Tick bite of right ear, subsequent encounter - BMP8+EGFR - Lyme Ab/Western Blot Reflex - Rocky mtn spotted fvr abs pnl(IgG+IgM   3. Malignant neoplasm of lower-inner quadrant of right breast of female, estrogen receptor positive ([place]) Keep radiation appt   Labs pending Will hold doxycyline  at this time, but I do not believe this is related to this. To me the rash looks more like some type of bug bites, since they have a small hole in the center of each papule. She will apply the steroid cream BID and let us know if it worsen. If she has any new areas at this point we will know that it is not related to doxycyline.   Evelina Dun, FNP

## 2018-07-03 NOTE — Patient Instructions (Signed)
Insect Bite, Adult An insect bite can make your skin red, itchy, and swollen. An insect bite is different from an insect sting, which happens when an insect injects poison (venom) into the skin. Some insects can spread disease to people through a bite. However, most insect bites do not lead to disease and are not serious. What are the causes? Insects may bite for a variety of reasons, including:  Hunger.  To defend themselves. Insects that bite include:  Spiders.  Mosquitoes.  Ticks.  Fleas.  Ants.  Flies.  Kissing bugs.  Chiggers. What are the signs or symptoms? Symptoms of this condition include:  Itching or pain in the bite area.  Redness and swelling in the bite area.  An open wound (skin ulcer). In many cases, symptoms last for 2-4 days. In rare cases, a person may have a severe allergic reaction (anaphylactic reaction) to a bite. Symptoms of an anaphylactic reaction may include:  Feeling warm in the face (flushed). This may include redness.  Itchy, red, swollen areas of skin (hives).  Swelling of the eyes, lips, face, mouth, tongue, or throat.  Difficulty breathing, speaking, or swallowing.  Noisy breathing (wheezing).  Dizziness or light-headedness.  Fainting.  Pain or cramping in the abdomen.  Vomiting.  Diarrhea. How is this diagnosed? This condition is usually diagnosed based on symptoms and a physical exam. How is this treated? Treatment is usually not needed. Symptoms often go away on their own. When treatment is recommended, it may involve:  Applying a cream or lotion to the bite area. This treatment helps with itching.  Taking an antibiotic medicine. This treatment is needed if the bite area gets infected.  Getting a tetanus shot, if you are not up to date on this vaccine.  Applying ice to the affected area.  Allergy medicines called antihistamines. This treatment may be needed if you develop itching or an allergic reaction to the  insect bite.  Giving yourself an epinephrine injection if you have an anaphylactic reaction to a bite. To give the injection, you will use what is commonly called an auto-injector "pen" (pre-filled automatic epinephrine injection device). Your health care provider will teach you how to use an auto-injector pen. Follow these instructions at home: Bite area care   Do not scratch the bite area.  Keep the bite area clean and dry. Wash it every day with soap and water as told by your health care provider.  Check the bite area every day for signs of infection. Check for: ? Redness, swelling, or pain. ? Fluid or blood. ? Warmth. ? Pus or a bad smell. Managing pain, itching, and swelling   You may apply cortisone cream, calamine lotion, or a paste made of baking soda and water to the bite area as told by your health care provider.  If directed, put ice on the bite area. ? Put ice in a plastic bag. ? Place a towel between your skin and the bag. ? Leave the ice on for 20 minutes, 2-3 times a day. General instructions  Apply or take over-the-counter and prescription medicines only as told by your health care provider.  If you were prescribed an antibiotic medicine, take or apply it as told by your health care provider. Do not stop using the antibiotic even if your condition improves.  Keep all follow-up visits as told by your health care provider. This is important. How is this prevented? To help reduce your risk of insect bites:  When you are outdoors,   is important.  How is this prevented?  To help reduce your risk of insect bites:  When you are outdoors, wear clothing that covers your arms and legs. This is especially important in the early morning and evening.  Use insect repellent. The best insect repellents contain DEET, picaridin, oil of lemon eucalyptus (OLE), or IR3535.  Consider spraying your clothing with a pesticide called permethrin. Permethrin helps prevent insect bites. It works for several weeks and for up to 5-6 clothing washes. Do not apply permethrin directly to the skin.  If your home windows do not have screens, consider installing them.  If you will be sleeping in  an area where there are mosquitoes, consider covering your sleeping area with a mosquito net.  Contact a health care provider if:  You have redness, swelling, or pain in the bite area.  You have fluid or blood coming from the bite area.  The bite area feels warm to the touch.  You have pus or a bad smell coming from the bite area.  You have a fever.  Get help right away if:  You have joint pain.  You have a rash.  You feel unusually tired or sleepy.  You have neck pain.  You have a headache.  You have unusual weakness.  You develop symptoms of an anaphylactic reaction. These may include:  Flushed skin.  Hives.  Swelling of the eyes, lips, face, mouth, tongue, or throat.  Difficulty breathing, speaking, or swallowing.  Wheezing.  Dizziness or light-headedness.  Fainting.  Pain or cramping in the abdomen.  Vomiting.  Diarrhea.  These symptoms may represent a serious problem that is an emergency. Do not wait to see if the symptoms will go away. Do the following right away:  Use the auto-injector pen as you have been instructed.  Get medical help. Call your local emergency services (911 in the [place] Do not drive yourself to the hospital.  Summary  An insect bite can make your skin red, itchy, and swollen.  Treatment is usually not needed. Symptoms often go away on their own. When treatment is recommended, it may involve taking medicine, applying medicine to the area, or applying ice.  Apply or take over-the-counter and prescription medicines only as told by your health care provider.  Use insect repellent to help prevent insect bites.  Contact a health care provider if you have any signs of infection in the bite area.  This information is not intended to replace advice given to you by your health care provider. Make sure you discuss any questions you have with your health care provider.  Document Released: 02/12/2004 Document Revised: 07/15/2017 Document Reviewed: 07/15/2017  [employer]vier Interactive Patient Education   2019 [employer]

## 2018-07-04 DIAGNOSIS — Z510 Encounter for antineoplastic radiation therapy: Secondary | ICD-10-CM | POA: Diagnosis not present

## 2018-07-04 DIAGNOSIS — C50111 Malignant neoplasm of central portion of right female breast: Secondary | ICD-10-CM | POA: Diagnosis not present

## 2018-07-05 ENCOUNTER — Telehealth: Payer: Self-pay | Admitting: Physician Assistant

## 2018-07-05 DIAGNOSIS — C50111 Malignant neoplasm of central portion of right female breast: Secondary | ICD-10-CM | POA: Diagnosis not present

## 2018-07-05 DIAGNOSIS — Z510 Encounter for antineoplastic radiation therapy: Secondary | ICD-10-CM | POA: Diagnosis not present

## 2018-07-05 LAB — BMP8+EGFR
BUN/Creatinine Ratio: 14 (ref 12–28)
BUN: 11 mg/dL (ref 8–27)
CO2: 23 mmol/L (ref 20–29)
Calcium: 9.4 mg/dL (ref 8.7–10.3)
Chloride: 104 mmol/L (ref 96–106)
Creatinine, Ser: 0.76 mg/dL (ref 0.57–1.00)
GFR calc Af Amer: 94 mL/min/{1.73_m2} (ref >59)
GFR calc non Af Amer: 81 mL/min/{1.73_m2} (ref >59)
Glucose: 93 mg/dL (ref 65–99)
Potassium: 4.3 mmol/L (ref 3.5–5.2)
Sodium: 139 mmol/L (ref 134–144)

## 2018-07-05 LAB — RMSF, IGG, IFA: RMSF, IGG, IFA: 1:128 {titer} — ABNORMAL HIGH

## 2018-07-05 LAB — ROCKY MTN SPOTTED FVR ABS PNL(IGG+IGM)
RMSF IgG: POSITIVE — AB
RMSF IgM: 0.57 index (ref 0.00–0.89)

## 2018-07-05 LAB — LYME AB/WESTERN BLOT REFLEX
LYME DISEASE AB, QUANT, IGM: <0.8 index (ref 0.00–0.79)
Lyme IgG/IgM Ab: <0.91 {ISR} (ref 0.00–0.90)

## 2018-07-05 NOTE — Telephone Encounter (Signed)
It is okay to go back on it, have enough?

## 2018-07-05 NOTE — Telephone Encounter (Signed)
Pt aware of provider feedback and voiced understanding. 

## 2018-07-06 DIAGNOSIS — R69 Illness, unspecified: Secondary | ICD-10-CM | POA: Diagnosis not present

## 2018-07-06 DIAGNOSIS — C50111 Malignant neoplasm of central portion of right female breast: Secondary | ICD-10-CM | POA: Diagnosis not present

## 2018-07-06 DIAGNOSIS — Z510 Encounter for antineoplastic radiation therapy: Secondary | ICD-10-CM | POA: Diagnosis not present

## 2018-07-07 ENCOUNTER — Ambulatory Visit (INDEPENDENT_AMBULATORY_CARE_PROVIDER_SITE_OTHER): Payer: Medicare HMO | Admitting: Family Medicine

## 2018-07-07 ENCOUNTER — Other Ambulatory Visit: Payer: Self-pay

## 2018-07-07 ENCOUNTER — Encounter: Payer: Self-pay | Admitting: Family Medicine

## 2018-07-07 DIAGNOSIS — R21 Rash and other nonspecific skin eruption: Secondary | ICD-10-CM

## 2018-07-07 DIAGNOSIS — C50111 Malignant neoplasm of central portion of right female breast: Secondary | ICD-10-CM | POA: Diagnosis not present

## 2018-07-07 DIAGNOSIS — Z510 Encounter for antineoplastic radiation therapy: Secondary | ICD-10-CM | POA: Diagnosis not present

## 2018-07-07 MED ORDER — PREDNISONE 20 MG PO TABS: ORAL_TABLET | ORAL | 0 refills | Status: DC

## 2018-07-07 MED ORDER — FAMOTIDINE 20 MG PO TABS: 20.0000 mg | ORAL_TABLET | Freq: Two times a day (BID) | ORAL | 0 refills | Status: DC

## 2018-07-07 MED ORDER — HYDROXYZINE HCL 25 MG PO TABS: 25.0000 mg | ORAL_TABLET | Freq: Three times a day (TID) | ORAL | 0 refills | Status: DC | PRN

## 2018-07-07 NOTE — Progress Notes (Signed)
Virtual Visit via telephone Note Due to COVID-19, visit is conducted virtually and was requested by patient. This visit type was conducted due to national recommendations for restrictions regarding the COVID-19 Pandemic (e.g. social distancing) in an effort to limit this patient's exposure and mitigate transmission in our community. All issues noted in this document were discussed and addressed.  A physical exam was not performed with this format.   I connected with Jaclyn Schmidt on 07/07/18 at 1150 by telephone and verified that I am speaking with the correct person using two identifiers. Jaclyn Schmidt is currently located at home and no one is currently with them during visit. The provider, Monia Pouch, FNP is located in their office at time of visit.  I discussed the limitations, risks, security and privacy concerns of performing an evaluation and management service by telephone and the availability of in person appointments. I also discussed with the patient that there may be a patient responsible charge related to this service. The patient expressed understanding and agreed to proceed.  Subjective:  Patient ID: Jaclyn Schmidt, female    DOB: [birth year], 68 y.o.   MRN: [id number]  Chief Complaint:  Rash   HPI: Jaclyn Schmidt is a 68 y.o. female presenting on 07/07/2018 for Rash   Pt reports the return of hives after restarting doxycycline. Pt states she was seen and placed on this due to a possible tick born illness. Pt states she took it for 9 days and stopped due to a pruritic rash. States she then restarted the doxycycline and took it for two more days and the rash returned. She took 11 days total of the antibiotic and stopped due to continued rash. She denies angioedema, shortness of breath, cough, trouble swallowing, or voice changes. No chest pain, dizziness, or syncope.   Rash This is a new problem. The current episode started 1 to 4 weeks ago. The problem has been waxing and  waning since onset. The affected locations include the abdomen, right arm, left arm, back and chest. The rash is characterized by redness, itchiness and swelling. She was exposed to a new medication. Pertinent negatives include no anorexia, congestion, cough, diarrhea, eye pain, facial edema, fatigue, fever, joint pain, nail changes, rhinorrhea, shortness of breath, sore throat or vomiting. Past treatments include antihistamine (steroids). The treatment provided mild relief.     Relevant past medical, surgical, family, and social history reviewed and updated as indicated.  Allergies and medications reviewed and updated.   Past Medical History:  Diagnosis Date  . Anxiety   . Cancer ([place]) 04/2018   right breast cancer  . Cholelithiasis   . Depression   . GERD (gastroesophageal reflux disease)   . History of kidney stones   . Hyperlipidemia   . Hypothyroidism   . Irritable bowel syndrome   . Pneumonia   . Thyroid disease   . Vitamin D deficiency     Past Surgical History:  Procedure Laterality Date  . ABDOMINAL HYSTERECTOMY  [id number]  . ANTERIOR CERVICAL DECOMP/DISCECTOMY FUSION N/A 01/21/2017   Procedure: C3-4, C4-5 ANTERIOR CERVICAL DISCECTOMY AND FUSION, ALLOGRAFT, PLATE;  Surgeon: [name], MD;  Location: [facility];  Service: Orthopedics;  Laterality: N/A;  . APPENDECTOMY  1996  . BREAST LUMPECTOMY WITH RADIOACTIVE SEED AND SENTINEL LYMPH NODE BIOPSY Right 05/30/2018   Procedure: RIGHT BREAST LUMPECTOMY WITH RADIOACTIVE SEED AND RIGHT AXILLARY SENTINEL LYMPH NODE BIOPSY;  Surgeon: Alphonsa Overall, MD;  Location: [facility];  Service: General;  Laterality: Right;  . BREAST MASS EXCISION  1997   left  . CHOLECYSTECTOMY  07/15/2011   Procedure: LAPAROSCOPIC CHOLECYSTECTOMY WITH INTRAOPERATIVE CHOLANGIOGRAM;  Surgeon: Shann Medal, MD;  [facility];  Service: General;  Laterality: N/A;  Cholecystectomy with IOC  . TOE FUSION    . TUBAL LIGATION  1980    Social  History   Socioeconomic History  . Marital status: Divorced    Spouse name: Not on file  . Number of children: 1  . Years of education: 30  . Highest education level: High school graduate  Occupational History  . Occupation: Retired     Fish farm manager: [employer]  Social Needs  . Financial resource strain: Not hard at all  . Food insecurity    Worry: Never true    Inability: Never true  . Transportation needs    Medical: No    Non-medical: No  Tobacco Use  . Smoking status: Former Smoker    Packs/day: 0.25    Years: 36.00    Pack years: 9.00    Types: Cigarettes    Start date: 09/30/1973    Quit date: 04/26/2018    Years since quitting: 0.1  . Smokeless tobacco: Never Used  Substance and Sexual Activity  . Alcohol use: No    Alcohol/week: 0.0 standard drinks  . Drug use: No  . Sexual activity: Not on file  Lifestyle  . Physical activity    Days per week: 0 days    Minutes per session: 0 min  . Stress: Not at all  Relationships  . Social connections    Talks on phone: More than three times a week    Gets together: More than three times a week    Attends religious service: More than 4 times per year    Active member of club or organization: No    Attends meetings of clubs or organizations: Never    Relationship status: Divorced  . Intimate partner violence    Fear of current or ex partner: No    Emotionally abused: No    Physically abused: No    Forced sexual activity: No  Other Topics Concern  . Not on file  Social History Narrative  . Not on file    Outpatient Encounter Medications as of 07/07/2018  Medication Sig  . ALPRAZolam (XANAX) 1 MG tablet Take 0.5 tablets (0.5 mg total) by mouth 3 (three) times daily. (Patient taking differently: Take 1 mg by mouth 3 (three) times daily as needed (FOR ANXIETY). )  . BIOTIN PO Take 2 tablets by mouth daily.  . cetirizine (ZYRTEC) 10 MG tablet Take 10 mg by mouth daily.  . Cholecalciferol (VITAMIN D3 PO) Take 4,000 Int'l  Units by mouth daily.  Marland Kitchen doxycycline (VIBRA-TABS) 100 MG tablet Take 1 tablet (100 mg total) by mouth 2 (two) times daily. (Patient not taking: Reported on 07/03/2018)  . famotidine (PEPCID) 20 MG tablet Take 1 tablet (20 mg total) by mouth 2 (two) times daily for 14 days.  . fluticasone (FLONASE) 50 MCG/ACT nasal spray Place 2 sprays into both nostrils daily as needed (FOR ALLERGIES.).   Marland Kitchen gabapentin (NEURONTIN) 300 MG capsule Take 3 capsules (900 mg total) by mouth at bedtime.  Marland Kitchen HYDROcodone-acetaminophen (NORCO/VICODIN) 5-325 MG tablet TAKE 1 (ONE) TABLET EVERY SIX HOURS, AS NEEDED  . hydrOXYzine (ATARAX/VISTARIL) 25 MG tablet Take 1 tablet (25 mg total) by mouth 3 (three) times daily as needed.  Marland Kitchen levothyroxine (SYNTHROID,  LEVOTHROID) 75 MCG tablet Take 1 tablet (75 mcg total) by mouth daily.  . pravastatin (PRAVACHOL) 40 MG tablet Take 1 tablet (40 mg total) by mouth daily.  . predniSONE (DELTASONE) 20 MG tablet 2 po at sametime daily for 5 days  . sertraline (ZOLOFT) 50 MG tablet Take 1 tablet (50 mg total) by mouth daily. (Needs to be seen before next refill)  . triamcinolone ointment (KENALOG) 0.5 % Apply 1 application topically 2 (two) times daily.   No facility-administered encounter medications on file as of 07/07/2018.     Allergies  Allergen Reactions  . Penicillins Rash and Other (See Comments)    PATIENT HAS HAD A PCN REACTION WITH IMMEDIATE RASH, FACIAL/TONGUE/THROAT SWELLING, SOB, OR LIGHTHEADEDNESS WITH HYPOTENSION:  #  #  #  YES  #  #  #   Has patient had a PCN reaction causing severe rash involving mucus membranes or skin necrosis: No Has patient had a PCN reaction that required hospitalization: No Has patient had a PCN reaction occurring within the last 10 years: No If all of the above answers are "NO", then may proceed with Cephalosporin use.     Review of Systems  Constitutional: Negative for fatigue and fever.  HENT: Negative for congestion, drooling, facial  swelling, rhinorrhea, sore throat, trouble swallowing and voice change.   Eyes: Negative for pain.  Respiratory: Negative for apnea, cough, choking, chest tightness, shortness of breath, wheezing and stridor.   Cardiovascular: Negative for chest pain, palpitations and leg swelling.  Gastrointestinal: Negative for anorexia, diarrhea and vomiting.  Musculoskeletal: Negative for joint pain.  Skin: Positive for rash. Negative for nail changes.  Neurological: Negative for dizziness, syncope, facial asymmetry, weakness, light-headedness, numbness and headaches.  Psychiatric/Behavioral: Negative for confusion.  All other systems reviewed and are negative.        Observations/Objective: No vital signs or physical exam, this was a telephone or virtual health encounter.  Pt alert and oriented, answers all questions appropriately, and able to speak in full sentences.    Assessment and Plan: Diagnoses and all orders for this visit:  Rash and nonspecific skin eruption Pt reports ongoing hives. No SHOB, angioedema, or syncope. Pt states she feels this is likely due to doxycycline. Pt has stopped the antibiotic. Will treat with below. Follow up in 1-2 weeks for reevaluation. May need referral to allergist. Her RMSF titers indicate a past infection but not an active infection.  -     predniSONE (DELTASONE) 20 MG tablet; 2 po at sametime daily for 5 days -     hydrOXYzine (ATARAX/VISTARIL) 25 MG tablet; Take 1 tablet (25 mg total) by mouth 3 (three) times daily as needed. -     famotidine (PEPCID) 20 MG tablet; Take 1 tablet (20 mg total) by mouth 2 (two) times daily for 14 days.     Follow Up Instructions: Return in about 2 weeks (around 07/21/2018), or if symptoms worsen or fail to improve, for Rash.    I discussed the assessment and treatment plan with the patient. The patient was provided an opportunity to ask questions and all were answered. The patient agreed with the plan and demonstrated an  understanding of the instructions.   The patient was advised to call back or seek an in-person evaluation if the symptoms worsen or if the condition fails to improve as anticipated.  The above assessment and management plan was discussed with the patient. The patient verbalized understanding of and has agreed to the management  plan. Patient is aware to call the clinic if symptoms persist or worsen. Patient is aware when to return to the clinic for a follow-up visit. Patient educated on when it is appropriate to go to the emergency department.    I provided 15 minutes of non-face-to-face time during this encounter. The call started at 1150. The call ended at 1205. The other time was used for coordination of care.    Monia Pouch, FNP-C [facility] [address] [place], [place] [address] [phone]

## 2018-07-10 DIAGNOSIS — C50111 Malignant neoplasm of central portion of right female breast: Secondary | ICD-10-CM | POA: Diagnosis not present

## 2018-07-10 DIAGNOSIS — Z510 Encounter for antineoplastic radiation therapy: Secondary | ICD-10-CM | POA: Diagnosis not present

## 2018-07-11 DIAGNOSIS — C50111 Malignant neoplasm of central portion of right female breast: Secondary | ICD-10-CM | POA: Diagnosis not present

## 2018-07-11 DIAGNOSIS — Z510 Encounter for antineoplastic radiation therapy: Secondary | ICD-10-CM | POA: Diagnosis not present

## 2018-07-12 DIAGNOSIS — Z510 Encounter for antineoplastic radiation therapy: Secondary | ICD-10-CM | POA: Diagnosis not present

## 2018-07-12 DIAGNOSIS — C50111 Malignant neoplasm of central portion of right female breast: Secondary | ICD-10-CM | POA: Diagnosis not present

## 2018-07-13 DIAGNOSIS — C50111 Malignant neoplasm of central portion of right female breast: Secondary | ICD-10-CM | POA: Diagnosis not present

## 2018-07-13 DIAGNOSIS — Z510 Encounter for antineoplastic radiation therapy: Secondary | ICD-10-CM | POA: Diagnosis not present

## 2018-07-14 ENCOUNTER — Ambulatory Visit (INDEPENDENT_AMBULATORY_CARE_PROVIDER_SITE_OTHER): Payer: Medicare HMO | Admitting: Family

## 2018-07-14 ENCOUNTER — Other Ambulatory Visit: Payer: Self-pay

## 2018-07-14 ENCOUNTER — Encounter: Payer: Self-pay | Admitting: Family

## 2018-07-14 DIAGNOSIS — C50111 Malignant neoplasm of central portion of right female breast: Secondary | ICD-10-CM | POA: Diagnosis not present

## 2018-07-14 DIAGNOSIS — L509 Urticaria, unspecified: Secondary | ICD-10-CM

## 2018-07-14 DIAGNOSIS — T7840XS Allergy, unspecified, sequela: Secondary | ICD-10-CM

## 2018-07-14 DIAGNOSIS — Z510 Encounter for antineoplastic radiation therapy: Secondary | ICD-10-CM | POA: Diagnosis not present

## 2018-07-14 DIAGNOSIS — T7840XD Allergy, unspecified, subsequent encounter: Secondary | ICD-10-CM

## 2018-07-14 NOTE — Progress Notes (Signed)
   Virtual Visit via telephone Note  I connected with Jaclyn Schmidt on 07/14/18 at 11:05 AM  by telephone and verified that I am speaking with the correct person using two identifiers. Jaclyn Schmidt is currently located at work and no one is currently with her during visit. The provider, Evelina Dun, FNP is located in their office at time of visit.  I discussed the limitations, risks, security and privacy concerns of performing an evaluation and management service by telephone and the availability of in person appointments. I also discussed with the patient that there may be a patient responsible charge related to this service. The patient expressed understanding and agreed to proceed.   History and Present Illness:  PT calls the office today to follow up on rash. She was seen on 06/21/18 for a tick bite and started on doxycycline. She then developed a rash  And it was unsure if this was related to doxycyline or the tick bite. We held the doxycycline  for a few days and had lab work drawn. She restarted the doxycycline in two days and states the rash restarted.   Her RMSF and Lyme were negative. Her IgG of RMSF was positive for a past exposure.   She completed her prednisone pepcid. She states her rash has greatly improved and doing well. She continues radiation radiation for right breast cancer. She does report fatigue.  Rash This is a recurrent problem. The current episode started 1 to 4 weeks ago. The problem has been gradually improving since onset. The rash is characterized by itchiness and redness. Past treatments include anti-itch cream, cold compress and oral steroids. The treatment provided moderate relief.      Review of Systems  Skin: Positive for rash.  All other systems reviewed and are negative.    Observations/Objective: No SOB or distress   Assessment and Plan: 1. Allergic reaction, sequela  2. Hives  I have placed doxycyline on her allergy list Rash has  greatly improved  -Pt to report any new fever, joint pain, or rash -Wear protective clothing while outside- Long sleeves and long pants -Put insect repellent on all exposed skin and along clothing -Take a shower as soon as possible after being outside Call if symptoms do not resolve or worsen      I discussed the assessment and treatment plan with the patient. The patient was provided an opportunity to ask questions and all were answered. The patient agreed with the plan and demonstrated an understanding of the instructions.   The patient was advised to call back or seek an in-person evaluation if the symptoms worsen or if the condition fails to improve as anticipated.  The above assessment and management plan was discussed with the patient. The patient verbalized understanding of and has agreed to the management plan. Patient is aware to call the clinic if symptoms persist or worsen. Patient is aware when to return to the clinic for a follow-up visit. Patient educated on when it is appropriate to go to the emergency department.   Time call ended:  11:16 AM  I provided 11 minutes of non-face-to-face time during this encounter.    Evelina Dun, FNP

## 2018-07-17 DIAGNOSIS — C50111 Malignant neoplasm of central portion of right female breast: Secondary | ICD-10-CM | POA: Diagnosis not present

## 2018-07-17 DIAGNOSIS — Z510 Encounter for antineoplastic radiation therapy: Secondary | ICD-10-CM | POA: Diagnosis not present

## 2018-07-18 DIAGNOSIS — C50111 Malignant neoplasm of central portion of right female breast: Secondary | ICD-10-CM | POA: Diagnosis not present

## 2018-07-18 DIAGNOSIS — Z510 Encounter for antineoplastic radiation therapy: Secondary | ICD-10-CM | POA: Diagnosis not present

## 2018-07-19 DIAGNOSIS — C50411 Malignant neoplasm of upper-outer quadrant of right female breast: Secondary | ICD-10-CM | POA: Diagnosis not present

## 2018-07-19 DIAGNOSIS — Z170 Estrogen receptor positive status [ER+]: Secondary | ICD-10-CM | POA: Diagnosis not present

## 2018-07-20 DIAGNOSIS — Z170 Estrogen receptor positive status [ER+]: Secondary | ICD-10-CM | POA: Diagnosis not present

## 2018-07-20 DIAGNOSIS — C50411 Malignant neoplasm of upper-outer quadrant of right female breast: Secondary | ICD-10-CM | POA: Diagnosis not present

## 2018-07-24 DIAGNOSIS — Z923 Personal history of irradiation: Secondary | ICD-10-CM | POA: Diagnosis not present

## 2018-07-24 DIAGNOSIS — C50511 Malignant neoplasm of lower-outer quadrant of right female breast: Secondary | ICD-10-CM | POA: Diagnosis not present

## 2018-07-24 DIAGNOSIS — C50411 Malignant neoplasm of upper-outer quadrant of right female breast: Secondary | ICD-10-CM | POA: Diagnosis not present

## 2018-07-24 DIAGNOSIS — Z170 Estrogen receptor positive status [ER+]: Secondary | ICD-10-CM | POA: Diagnosis not present

## 2018-07-25 DIAGNOSIS — C50411 Malignant neoplasm of upper-outer quadrant of right female breast: Secondary | ICD-10-CM | POA: Diagnosis not present

## 2018-07-25 DIAGNOSIS — Z170 Estrogen receptor positive status [ER+]: Secondary | ICD-10-CM | POA: Diagnosis not present

## 2018-07-27 ENCOUNTER — Telehealth: Payer: Self-pay | Admitting: Physician Assistant

## 2018-07-27 NOTE — Telephone Encounter (Signed)
Patient states that she is due for a Dexa scan in February 2021.  She will call back in a month to schedule

## 2018-08-08 DIAGNOSIS — Z20828 Contact with and (suspected) exposure to other viral communicable diseases: Secondary | ICD-10-CM | POA: Diagnosis not present

## 2018-08-09 ENCOUNTER — Other Ambulatory Visit (INDEPENDENT_AMBULATORY_CARE_PROVIDER_SITE_OTHER): Payer: Self-pay | Admitting: Orthopaedic Surgery

## 2018-08-09 NOTE — Telephone Encounter (Signed)
Please advise 

## 2018-08-09 NOTE — Telephone Encounter (Signed)
Ok refill times 2.  ROV in a couple months thanks

## 2018-08-21 ENCOUNTER — Other Ambulatory Visit: Payer: Self-pay | Admitting: Physician Assistant

## 2018-08-23 DIAGNOSIS — Z79811 Long term (current) use of aromatase inhibitors: Secondary | ICD-10-CM | POA: Diagnosis not present

## 2018-08-23 DIAGNOSIS — R159 Full incontinence of feces: Secondary | ICD-10-CM | POA: Diagnosis not present

## 2018-08-23 DIAGNOSIS — C50411 Malignant neoplasm of upper-outer quadrant of right female breast: Secondary | ICD-10-CM | POA: Diagnosis not present

## 2018-08-23 DIAGNOSIS — R69 Illness, unspecified: Secondary | ICD-10-CM | POA: Diagnosis not present

## 2018-08-23 DIAGNOSIS — Z170 Estrogen receptor positive status [ER+]: Secondary | ICD-10-CM | POA: Diagnosis not present

## 2018-08-23 DIAGNOSIS — L309 Dermatitis, unspecified: Secondary | ICD-10-CM | POA: Diagnosis not present

## 2018-08-23 DIAGNOSIS — Z9889 Other specified postprocedural states: Secondary | ICD-10-CM | POA: Diagnosis not present

## 2018-09-13 ENCOUNTER — Ambulatory Visit: Payer: Medicare HMO | Attending: Surgery | Admitting: Physical Therapy

## 2018-09-13 ENCOUNTER — Encounter: Payer: Self-pay | Admitting: Physical Therapy

## 2018-09-13 ENCOUNTER — Other Ambulatory Visit: Payer: Self-pay

## 2018-09-13 DIAGNOSIS — M6281 Muscle weakness (generalized): Secondary | ICD-10-CM | POA: Insufficient documentation

## 2018-09-13 DIAGNOSIS — R159 Full incontinence of feces: Secondary | ICD-10-CM | POA: Insufficient documentation

## 2018-09-13 DIAGNOSIS — R278 Other lack of coordination: Secondary | ICD-10-CM | POA: Insufficient documentation

## 2018-09-13 NOTE — Therapy (Signed)
[facility] [address], [place], [place], [address] Phone: [phone]   Fax:  [phone]  Physical Therapy Evaluation  Patient Details  Name: Jaclyn Schmidt MRN: [id number] Date of Birth: 08-Apr-1950 Referring Provider (PT): Dr. Nadeen Landau   Encounter Date: 09/13/2018  PT End of Session - 09/13/18 1610    Visit Number  1    Date for PT Re-Evaluation  11/08/18    Authorization Type  aetna medicare    PT Start Time  1545    PT Stop Time  1625    PT Time Calculation (min)  40 min    Activity Tolerance  Patient tolerated treatment well    Behavior During Therapy  [facility] for tasks assessed/performed       Past Medical History:  Diagnosis Date  . Anxiety   . Cancer ([place]) 04/2018   right breast cancer  . Cholelithiasis   . Depression   . GERD (gastroesophageal reflux disease)   . History of kidney stones   . Hyperlipidemia   . Hypothyroidism   . Irritable bowel syndrome   . Pneumonia   . Thyroid disease   . Vitamin D deficiency     Past Surgical History:  Procedure Laterality Date  . ABDOMINAL HYSTERECTOMY  [id number]  . ANTERIOR CERVICAL DECOMP/DISCECTOMY FUSION N/A 01/21/2017   Procedure: C3-4, C4-5 ANTERIOR CERVICAL DISCECTOMY AND FUSION, ALLOGRAFT, PLATE;  Surgeon: [name], MD;  Location: [facility];  Service: Orthopedics;  Laterality: N/A;  . APPENDECTOMY  1996  . BREAST LUMPECTOMY WITH RADIOACTIVE SEED AND SENTINEL LYMPH NODE BIOPSY Right 05/30/2018   Procedure: RIGHT BREAST LUMPECTOMY WITH RADIOACTIVE SEED AND RIGHT AXILLARY SENTINEL LYMPH NODE BIOPSY;  Surgeon: Alphonsa Overall, MD;  Location: [facility];  Service: General;  Laterality: Right;  . BREAST MASS EXCISION  1997   left  . CHOLECYSTECTOMY  07/15/2011   Procedure: LAPAROSCOPIC CHOLECYSTECTOMY WITH INTRAOPERATIVE CHOLANGIOGRAM;  Surgeon: Shann Medal, MD;  Location[facility];  Service: General;  Laterality: N/A;  Cholecystectomy  with IOC  . TOE FUSION    . TUBAL LIGATION  1980    There were no vitals filed for this visit.   Subjective Assessment - 09/13/18 1552    Subjective  Patient has to wear a pad due to fecal incontinence. Patient is scarred to go out due to the incontinence. Patient can feel the stool come out. Since she saw Dr. Dema Severin she has had only 1 accident.  I have alot of gas and not able to control the gas.    Patient Stated Goals  reduce fecal incontinence    Currently in Pain?  No/denies         [facility] PT Assessment - 09/13/18 0001      Assessment   Medical Diagnosis  R15.9 Fecal Incontinence    Referring Provider (PT)  Dr. Nadeen Landau    Onset Date/Surgical Date  --   6-7 years   Prior Therapy  none      Precautions   Precautions  Other (comment)    Precaution Comments  breast cancer      Restrictions   Weight Bearing Restrictions  No      Balance Screen   Has the patient fallen in the past 6 months  Yes    How many times?  1   flip flop got caught on stone, not due to balance   Has the patient had a decrease in activity level because  of a fear of falling?   No    Is the patient reluctant to leave their home because of a fear of falling?   No      Home Film/video editor residence      Prior Function   Level of Independence  Independent    Leisure  walk 1 hour for 3-4 times per week      Cognition   Overall Cognitive Status  Within Functional Limits for tasks assessed      Posture/Postural Control   Posture/Postural Control  No significant limitations      ROM / Strength   AROM / PROM / Strength  AROM;PROM;Strength      AROM   Lumbar Flexion  decreased by 25%    Lumbar Extension  decreased by 25%    Lumbar - Right Side Bend  decreased by 25%    Lumbar - Left Side Bend  decreased by 25%    Lumbar - Right Rotation  decreased by 25%    Lumbar - Left Rotation  decreased by 25%      Strength   Right Hip Flexion  4/5    Right Hip  Extension  4/5    Right Hip ABduction  4/5    Left Hip Extension  4/5    Left Hip ABduction  4/5      Palpation   Palpation comment  tenderness located in right lower abdominal, decreased mobility of diaphragm                Objective measurements completed on examination: See above findings.    Pelvic Floor Special Questions - 09/13/18 0001    Prior Pregnancies  Yes    Number of Pregnancies  1    Number of Vaginal Deliveries  1    Any difficulty with labor and deliveries  Yes   required forceps    Urinary Leakage  No    Urinary frequency  needs to place finger inside the anal canal to get the stool out     Fecal incontinence  Yes   trouble pushing out stool   Skin Integrity  Intact   dryness   Pelvic Floor Internal Exam  Patient confirms identification and approves PT to assess patient and treatment    Exam Type  Rectal    Palpation  will contract the gluteals instead of the anus; when ask for contracting the vagina then is able to conract the rectum with a flicker    Strength  Flicker    Tone  low tone               PT Education - 09/13/18 1628    Education Details  instruction on pelvic floor contraction in sidely    Person(s) Educated  Patient    Methods  Explanation;Demonstration;Verbal cues;Handout    Comprehension  Verbalized understanding;Returned demonstration       PT Short Term Goals - 09/13/18 1632      PT SHORT TERM GOAL #1   Title  independent with initial HEP    Time  4    Period  Weeks    Status  New    Target Date  10/11/18      PT SHORT TERM GOAL #2   Title  understand correct toileting technique to push stool out    Time  4    Period  Weeks    Status  New    Target Date  10/11/18  PT SHORT TERM GOAL #3   Title  understand how to perform abdominal massage to help with peristalic motion of the large intestines.    Time  4    Period  Weeks    Status  New    Target Date  10/11/18      PT SHORT TERM GOAL #4   Title   Pelvic floor strength is 2/5 with anal contraction and no gluteal so her fecal incontinence improved >/= 25%    Time  4    Period  Weeks    Status  New    Target Date  10/11/18        PT Long Term Goals - 09/13/18 1644      PT LONG TERM GOAL #1   Title  independence with HEP    Time  8    Period  Weeks    Status  New    Target Date  11/08/18      PT LONG TERM GOAL #2   Title  pelvic floor strength >/= 3/5 so she is having 1-0 fecal leakage and does not have to wear a pad    Time  8    Period  Weeks    Status  New    Target Date  11/08/18      PT LONG TERM GOAL #3   Title  patient is able to control her gas with >/= 75% greater ease due to improved pelvic floor coordination    Time  8    Period  Weeks    Status  New    Target Date  11/08/18      PT LONG TERM GOAL #4   Title  Patient is able to go out without worrying about fecal leakage due to improved pelvic floor strength and coordination    Time  8    Period  Weeks    Status  New    Target Date  11/08/18      PT LONG TERM GOAL #5   Title  able to push stool out with >/= 75% greater ease due to improved pelvic floor coordination    Time  8    Period  Weeks    Status  New    Target Date  11/08/18             Plan - 09/13/18 1628    Clinical Impression Statement  Patient is a 68 year old female with fecal incontinence for 6 years. Patient has to wear a pad at all times and does not go out due to the leakage. Patient has weakness in bilateral hip abduction and extension. Anal strength is 1/5 with needing tactile and verbal cues to not contract her gluteals. Patient has difficulty with controlling gas. Patient has difficulty with pushing stool out when she is having a bowel movement and has to expel it manually. Lumbar ROM is limited by 25%. Patient is s/p estrogen positive breast cancer and has vaginal dryness due to lack of Estrogen.Patient will benefit from skilled therapy to improve pelvic floor coordination,  education on vaginal and bowel health and improve strength.    Personal Factors and Comorbidities  Age;Comorbidity 1;Sex;Fitness;Comorbidity 2    Comorbidities  s/p breast cancer; partial hysterectomy    Examination-Activity Limitations  Toileting;Continence    Examination-Participation Restrictions  Community Activity;Shop;Interpersonal Relationship    Stability/Clinical Decision Making  Evolving/Moderate complexity    Clinical Decision Making  Moderate    Rehab Potential  Excellent  PT Frequency  1x / week    PT Duration  8 weeks    PT Treatment/Interventions  Biofeedback;Electrical Stimulation;Therapeutic exercise;Therapeutic activities;Neuromuscular re-education;Patient/family education;Manual techniques    PT Next Visit Plan  cocontraction of pelvic floor with ball, bridge with ball, hip flexion with resistance, bowel health, vaginal health    Consulted and Agree with Plan of Care  Patient       Patient will benefit from skilled therapeutic intervention in order to improve the following deficits and impairments:  Decreased range of motion, Increased fascial restricitons, Decreased endurance, Decreased strength, Decreased coordination  Visit Diagnosis: Muscle weakness (generalized)  Other lack of coordination  Incontinence of feces, unspecified fecal incontinence type     Problem List Patient Active Problem List   Diagnosis Date Noted  . Malignant neoplasm of lower-inner quadrant of right breast of female, estrogen receptor positive ([place]) 04/26/2018  . Upper respiratory tract infection 12/05/2017  . Vertigo 09/05/2017  . Hypothyroidism 07/18/2017  . Osteopenia 03/09/2017  . Anal sphincter incompetence 12/29/2016  . Depression, recurrent ([place]) 12/29/2016  . Chronic midline thoracic back pain 09/21/2016  . Lumbar pain 09/21/2016  . Gall bladder disease 06/10/2011  . Cholelithiasis 06/03/2011    Earlie Counts, PT 09/13/18 4:49 PM   [facility] [address] [place], [place], [address] Phone: [phone]   Fax:  [phone]  Name: Jaclyn Schmidt MRN: [id number] Date of Birth: [birth year]

## 2018-09-13 NOTE — Patient Instructions (Addendum)
Quick Contraction: Gravity Eliminated (Side-Lying)    Lie on left side, hips and knees slightly bent. Quickly squeeze then fully relax pelvic floor. Perform __1_ sets of __5_. Rest for _1__ seconds between sets. Do __5_ times a day.   Copyright  VHI. All rights reserved.    Slow Contraction: Gravity Eliminated (Side-Lying)    Lie on left side, hips and knees slightly bent. Slowly squeeze pelvic floor for _5__ seconds. Rest for _5__ seconds. Repeat _5__ times. Do _5__ times a day.   Copyright  VHI. All rights reserved.  [facility] [address], [place], [place] [address] Phone # [phone] Fax [phone]

## 2018-10-03 DIAGNOSIS — Z170 Estrogen receptor positive status [ER+]: Secondary | ICD-10-CM | POA: Diagnosis not present

## 2018-10-03 DIAGNOSIS — Z79899 Other long term (current) drug therapy: Secondary | ICD-10-CM | POA: Diagnosis not present

## 2018-10-03 DIAGNOSIS — Z853 Personal history of malignant neoplasm of breast: Secondary | ICD-10-CM | POA: Diagnosis not present

## 2018-10-03 DIAGNOSIS — C50511 Malignant neoplasm of lower-outer quadrant of right female breast: Secondary | ICD-10-CM | POA: Diagnosis not present

## 2018-10-04 ENCOUNTER — Other Ambulatory Visit: Payer: Self-pay

## 2018-10-04 ENCOUNTER — Ambulatory Visit: Payer: Medicare HMO | Attending: Surgery | Admitting: Physical Therapy

## 2018-10-04 ENCOUNTER — Encounter: Payer: Self-pay | Admitting: Physical Therapy

## 2018-10-04 DIAGNOSIS — R159 Full incontinence of feces: Secondary | ICD-10-CM | POA: Diagnosis not present

## 2018-10-04 DIAGNOSIS — M545 Low back pain: Secondary | ICD-10-CM | POA: Insufficient documentation

## 2018-10-04 DIAGNOSIS — R278 Other lack of coordination: Secondary | ICD-10-CM | POA: Insufficient documentation

## 2018-10-04 DIAGNOSIS — M6281 Muscle weakness (generalized): Secondary | ICD-10-CM

## 2018-10-04 NOTE — Therapy (Signed)
[facility] [address] [place], [place], [address] Phone: [phone]   Fax:  [phone]  Physical Therapy Treatment  Patient Details  Name: Jaclyn Schmidt MRN: [id number] Date of Birth: 26-Jun-1950 Referring Provider (PT): Dr. Nadeen Landau   Encounter Date: 10/04/2018  PT End of Session - 10/04/18 1632    Visit Number  2    Date for PT Re-Evaluation  11/08/18    Authorization Type  aetna medicare    PT Start Time  1545    PT Stop Time  1623    PT Time Calculation (min)  38 min    Activity Tolerance  Patient tolerated treatment well    Behavior During Therapy  [facility] for tasks assessed/performed       Past Medical History:  Diagnosis Date  . Anxiety   . Cancer ([place]) 04/2018   right breast cancer  . Cholelithiasis   . Depression   . GERD (gastroesophageal reflux disease)   . History of kidney stones   . Hyperlipidemia   . Hypothyroidism   . Irritable bowel syndrome   . Pneumonia   . Thyroid disease   . Vitamin D deficiency     Past Surgical History:  Procedure Laterality Date  . ABDOMINAL HYSTERECTOMY  [id number]  . ANTERIOR CERVICAL DECOMP/DISCECTOMY FUSION N/A 01/21/2017   Procedure: C3-4, C4-5 ANTERIOR CERVICAL DISCECTOMY AND FUSION, ALLOGRAFT, PLATE;  Surgeon: [name], MD;  Location: [facility];  Service: Orthopedics;  Laterality: N/A;  . APPENDECTOMY  1996  . BREAST LUMPECTOMY WITH RADIOACTIVE SEED AND SENTINEL LYMPH NODE BIOPSY Right 05/30/2018   Procedure: RIGHT BREAST LUMPECTOMY WITH RADIOACTIVE SEED AND RIGHT AXILLARY SENTINEL LYMPH NODE BIOPSY;  Surgeon: Alphonsa Overall, MD;  Location: [facility];  Service: General;  Laterality: Right;  . BREAST MASS EXCISION  1997   left  . CHOLECYSTECTOMY  07/15/2011   Procedure: LAPAROSCOPIC CHOLECYSTECTOMY WITH INTRAOPERATIVE CHOLANGIOGRAM;  Surgeon: Shann Medal, MD;  Location: [facility];  Service: General;  Laterality: N/A;  Cholecystectomy  with IOC  . TOE FUSION    . TUBAL LIGATION  1980    There were no vitals filed for this visit.  Subjective Assessment - 10/04/18 1546    Subjective  I have a knot under my arm that they will check further. The incontinence is better and the medicine is making my bowels firmer. After I have a bowel movement I have a sticky slimy substance on the toilet paper. I only had 1 accident since I saw you last. I have had alot of gas.    Patient Stated Goals  reduce fecal incontinence    Currently in Pain?  No/denies    Multiple Pain Sites  No                    Pelvic Floor Special Questions - 10/04/18 0001    Pelvic Floor Internal Exam  Patient confirms identification and approves PT to assess patient and treatment    Exam Type  Rectal    Strength  weak squeeze, no lift    Strength # of seconds  5   needs VC to breath       [facility] Adult PT Treatment/Exercise - 10/04/18 0001      Self-Care   Self-Care  Other Self-Care Comments    Other Self-Care Comments   instruction on vaginal moisturizers and lubricants to improve vaginal health      Therapeutic Activites  Therapeutic Activities  Other Therapeutic Activities    Other Therapeutic Activities  education on correct toileting technique with diaphgramatic breathing, pelvic floor relaxation, and knees above the hips      Lumbar Exercises: Seated   Other Seated Lumbar Exercises  sitting resistive hip flexion with pelvic floor contraction to work the lower abdominals    Other Seated Lumbar Exercises  sit with ball squeeze with pelvic floor contraction; sitting hip abduction against the yellow band with pelvic floor contraction      Lumbar Exercises: Sidelying   Other Sidelying Lumbar Exercises  pelvic floor contraction and quick flicks with therapist finger in the anus for tactile cues             PT Education - 10/04/18 1636    Education Details  Access Code: [email or web address]; toileting technique; vaginal moisturizers and  lubricants    Person(s) Educated  Patient    Methods  Explanation;Demonstration;Verbal cues;Handout    Comprehension  Verbalized understanding;Returned demonstration       PT Short Term Goals - 09/13/18 1632      PT SHORT TERM GOAL #1   Title  independent with initial HEP    Time  4    Period  Weeks    Status  New    [place] Date  10/11/18      PT SHORT TERM GOAL #2   Title  understand correct toileting technique to push stool out    Time  4    Period  Weeks    Status  New    Target Date  10/11/18      PT SHORT TERM GOAL #3   Title  understand how to perform abdominal massage to help with peristalic motion of the large intestines.    Time  4    Period  Weeks    Status  New    Target Date  10/11/18      PT SHORT TERM GOAL #4   Title  Pelvic floor strength is 2/5 with anal contraction and no gluteal so her fecal incontinence improved >/= 25%    Time  4    Period  Weeks    Status  New    Target Date  10/11/18        PT Long Term Goals - 09/13/18 1644      PT LONG TERM GOAL #1   Title  independence with HEP    Time  8    Period  Weeks    Status  New    Target Date  11/08/18      PT LONG TERM GOAL #2   Title  pelvic floor strength >/= 3/5 so she is having 1-0 fecal leakage and does not have to wear a pad    Time  8    Period  Weeks    Status  New    Target Date  11/08/18      PT LONG TERM GOAL #3   Title  patient is able to control her gas with >/= 75% greater ease due to improved pelvic floor coordination    Time  8    Period  Weeks    Status  New    Target Date  11/08/18      PT LONG TERM GOAL #4   Title  Patient is able to go out without worrying about fecal leakage due to improved pelvic floor strength and coordination    Time  8    Period  Weeks  Status  New    Target Date  11/08/18      PT LONG TERM GOAL #5   Title  able to push stool out with >/= 75% greater ease due to improved pelvic floor coordination    Time  8    Period  Weeks     Status  New    Target Date  11/08/18            Plan - 10/04/18 1632    Clinical Impression Statement  Patient is having a lump looked at under her arm. Patient reports 1 leakage accident since last visit. Patient reports she does better with exercises in sitting due to her work. Patient pelvic floor strength has increased to 2/5. Patient is now able to isolate an anal sphinter contraction. Patient will benefit from skilled therapy to improve pelvic floor coordination, education on vaginal and bowel health and imrpove strength.    Personal Factors and Comorbidities  Age;Comorbidity 1;Sex;Fitness;Comorbidity 2    Comorbidities  s/p breast cancer; partial hysterectomy    Examination-Activity Limitations  Toileting;Continence    Examination-Participation Restrictions  Community Activity;Shop;Interpersonal Relationship    Stability/Clinical Decision Making  Evolving/Moderate complexity    Rehab Potential  Excellent    PT Frequency  1x / week    PT Duration  8 weeks    PT Treatment/Interventions  Biofeedback;Electrical Stimulation;Therapeutic exercise;Therapeutic activities;Neuromuscular re-education;Patient/family education;Manual techniques    PT Next Visit Plan  pelvic floor contraction with hold then quick flicks; plie with pelvic floor contraction, increase hold in supine and sitting; abdominal massage    PT Home Exercise Plan  Access Code: [email or web address]    Recommended Other Services  MD signed initial note    Consulted and Agree with Plan of Care  Patient       Patient will benefit from skilled therapeutic intervention in order to improve the following deficits and impairments:  Decreased range of motion, Increased fascial restricitons, Decreased endurance, Decreased strength, Decreased coordination  Visit Diagnosis: Muscle weakness (generalized)  Other lack of coordination  Incontinence of feces, unspecified fecal incontinence type     Problem List Patient Active Problem List    Diagnosis Date Noted  . Malignant neoplasm of lower-inner quadrant of right breast of female, estrogen receptor positive ([place]) 04/26/2018  . Upper respiratory tract infection 12/05/2017  . Vertigo 09/05/2017  . Hypothyroidism 07/18/2017  . Osteopenia 03/09/2017  . Anal sphincter incompetence 12/29/2016  . Depression, recurrent ([place]) 12/29/2016  . Chronic midline thoracic back pain 09/21/2016  . Lumbar pain 09/21/2016  . Gall bladder disease 06/10/2011  . Cholelithiasis 06/03/2011    Earlie Counts, PT 10/04/18 4:40 PM   [facility] [address] [place], [place], [address] Phone: [phone]   Fax:  [phone]  Name: Jaclyn Schmidt MRN: [id number] Date of Birth: April 15, 1950

## 2018-10-04 NOTE — Patient Instructions (Addendum)
Moisturizers . They are used in the vagina to hydrate the mucous membrane that make up the vaginal canal. . Designed to keep a more normal acid balance (ph) . Once placed in the vagina, it will last between two to three days.  . Use 2-3 times per week at bedtime and last longer than 60 min. . Ingredients to avoid is glycerin and fragrance, can increase chance of infection . Should not be used just before sex due to causing irritation . Most are gels administered either in a tampon-shaped applicator or as a vaginal suppository. They are non-hormonal.   Types of Moisturizers .  Marland Kitchen Vitamin E vaginal suppositories- Whole foods, Amazon . Moist Again . Coconut oil- can break down condoms . Julva- (Do no use if on Tamoxifen) amazon . Yes moisturizer- amazon . NeuEve Silk , NeuEve Silver for menopausal or over 65 (if have severe vaginal atrophy or cancer treatments use NeuEve Silk for  1 month than move to The Pepsi)- [employer], MapleFlower.dk . Olive and Bee intimate cream- www.oliveandbee.com.au . Mae vaginal moisturizer- Amazon  Creams to use externally on the Vulva area  Albertson's (good for for cancer patients that had radiation to the area)- [place] or [employer] Corporation.FlyingBasics.com.br  V-magic cream - amazon  Julva-amazon  Vital "V Wild Yam salve ( help moisturize and help with thinning vulvar area, does have [place] by Damiva labial moisturizer ([place],    Things to avoid in the vaginal area . Do not use things to irritate the vulvar area . No lotions just specialized creams for the vulva area- Neogyn, V-magic, No soaps; can use Aveeno or Calendula cleanser if needed. Must be gentle . No deodorants . No douches . Good to sleep without underwear to let the vaginal area to air out . No scrubbing: spread the lips to let warm water rinse over labias and pat dry  Lubrication . Used for intercourse to  reduce friction . Avoid ones that have glycerin, warming gels, tingling gels, icing or cooling gel, scented . Avoid parabens due to a preservative similar to female sex hormone . May need to be reapplied once or several times during sexual activity . Can be applied to both partners genitals prior to vaginal penetration to minimize friction or irritation . Prevent irritation and mucosal tears that cause post coital pain and increased the risk of vaginal and urinary tract infections . Oil-based lubricants cannot be used with condoms due to breaking them down.  Least likely to irritate vaginal tissue.  . Plant based-lubes are safe . Silicone-based lubrication are thicker and last long and used for post-menopausal women  Vaginal Lubricators Here is a list of some suggested lubricators you can use for intercourse. Use the most hypoallergenic product.  You can place on you or your partner.   Slippery Stuff  Sylk or Sliquid Natural H2O ( good  if frequent UTI's)  Blossom Organics (www.blossom-organics.com)    Coconut oil  PJur Woman Nude- water based lubricant, amazon  Uberlube- Amazon  Aloe Vera  Yes lubricant- [employer] Platinum-Silicone, Target, Walgreens  Olive and Bee intimate cream-  www.oliveandbee.com.au  Pink - Amazon Things to avoid in lubricants are glycerin, warming gels, tingling gels, icing or cooling  gels, and scented gels.  Also avoid Vaseline. KY jelly, Replens, and Astroglide kills good bacteria(lactobacilli)  Things to avoid in the vaginal area . Do not use things to irritate the  vulvar area . No lotions- see below . No soaps; can use Aveeno or Calendula cleanser if needed. Must be gentle . No deodorants . No douches . Good to sleep without underwear to let the vaginal area to air out . No scrubbing: spread the lips to let warm water rinse over labias and pat dry  Creams that can be used on the Lipscomb Releveum or Desert Harvest Gele   Toileting Techniques for Bowel Movements (Defecation) Using your belly (abdomen) and pelvic floor muscles to have a bowel movement is usually instinctive.  Sometimes people can have problems with these muscles and have to relearn proper defecation (emptying) techniques.  If you have weakness in your muscles, organs that are falling out, decreased sensation in your pelvis, or ignore your urge to go, you may find yourself straining to have a bowel movement.  You are straining if you are: . holding your breath or taking in a huge gulp of air and holding it  . keeping your lips and jaw tensed and closed tightly . turning red in the face because of excessive pushing or forcing . developing or worsening your  hemorrhoids . getting faint while pushing . not emptying completely and have to defecate many times a day  If you are straining, you are actually making it harder for yourself to have a bowel movement.  Many people find they are pulling up with the pelvic floor muscles and closing off instead of opening the anus. Due to lack pelvic floor relaxation and coordination the abdominal muscles, one has to work harder to push the feces out.  Many people have never been taught how to defecate efficiently and effectively.  Notice what happens to your body when you are having a bowel movement.  While you are sitting on the toilet pay attention to the following areas: . Jaw and mouth position . Angle of your hips   . Whether your feet touch the ground or not . Arm placement  . Spine position . Waist . Belly tension . Anus (opening of the anal canal)  An Evacuation/Defecation Plan   Here are the 4 basic points:  1. Lean forward enough for your elbows to rest on your knees 2. Support your feet on the floor or use a low stool if your feet don't touch the floor  3. Push out your belly as if you  have swallowed a beach ball-you should feel a widening of your waist 4. Open and relax your pelvic floor muscles, rather than tightening around the anus       The following conditions my require modifications to your toileting posture:  . If you have had surgery in the past that limits your back, hip, pelvic, knee or ankle flexibility . Constipation   Your healthcare practitioner may make the following additional suggestions and adjustments:  1) Sit on the toilet  a) Make sure your feet are supported. b) Notice your hip angle and spine position-most people find it effective to lean forward or raise their knees, which can help the muscles around the anus to relax  c) When you lean forward, place your forearms on your thighs for support  2) Relax suggestions a) Breath deeply in through your nose and out slowly through your mouth as if you are smelling the flowers and blowing out the candles. b) To become  aware of how to relax your muscles, contracting and releasing muscles can be helpful.  Pull your pelvic floor muscles in tightly by using the image of holding back gas, or closing around the anus (visualize making a circle smaller) and lifting the anus up and in.  Then release the muscles and your anus should drop down and feel open. Repeat 5 times ending with the feeling of relaxation. c) Keep your pelvic floor muscles relaxed; let your belly bulge out. d) The digestive tract starts at the mouth and ends at the anal opening, so be sure to relax both ends of the tube.  Place your tongue on the roof of your mouth with your teeth separated.  This helps relax your mouth and will help to relax the anus at the same time.  3) Empty (defecation) a) Keep your pelvic floor and sphincter relaxed, then bulge your anal muscles.  Make the anal opening wide.  b) Stick your belly out as if you have swallowed a beach ball. c) Make your belly wall hard using your belly muscles while continuing to breathe.  Doing this makes it easier to open your anus. d) Breath out and give a grunt (or try using other sounds such as ahhhh, shhhhh, ohhhh or grrrrrrr).  4) Finish a) As you finish your bowel movement, pull the pelvic floor muscles up and in.  This will leave your anus in the proper place rather than remaining pushed out and down. If you leave your anus pushed out and down, it will start to feel as though that is normal and give you incorrect signals about needing to have a bowel movement. [facility] [address] [place], [place] [address] Phone # [phone] Fax [phone]   Access Code: [email or web address]  URL: [email or web address]  Date: 10/04/2018  Prepared by: Earlie Counts   Exercises Seated Hip Adduction Squeeze with Diona Foley - 10 reps - 1 sets - 5 sec hold - 2x daily - 7x weekly Seated Hip Abduction with Resistance - 10 reps - 1 sets - 3 sec hold - 1x daily - 7x weekly Seated Heel Raise - 10 reps - 1 sets - 1x daily - 7x weekly

## 2018-10-09 DIAGNOSIS — R69 Illness, unspecified: Secondary | ICD-10-CM | POA: Diagnosis not present

## 2018-10-10 ENCOUNTER — Ambulatory Visit: Payer: Medicare HMO | Admitting: Physical Therapy

## 2018-10-10 ENCOUNTER — Other Ambulatory Visit: Payer: Self-pay

## 2018-10-10 ENCOUNTER — Encounter: Payer: Self-pay | Admitting: Physical Therapy

## 2018-10-10 DIAGNOSIS — R278 Other lack of coordination: Secondary | ICD-10-CM

## 2018-10-10 DIAGNOSIS — M6281 Muscle weakness (generalized): Secondary | ICD-10-CM

## 2018-10-10 DIAGNOSIS — R159 Full incontinence of feces: Secondary | ICD-10-CM

## 2018-10-10 DIAGNOSIS — M545 Low back pain, unspecified: Secondary | ICD-10-CM

## 2018-10-10 NOTE — Patient Instructions (Signed)
Access Code: [email or web address]  URL: [email or web address]  Date: 10/10/2018  Prepared by: Earlie Counts   Exercises Seated Hip Adduction Squeeze with Diona Foley - 10 reps - 1 sets - 5 sec hold - 2x daily - 7x weekly Seated Hip Abduction with Resistance - 10 reps - 1 sets - 5 sec hold - 1x daily - 7x weekly Seated Heel Raise - 10 reps - 1 sets - 1x daily - 7x weekly Supine Pelvic Floor Contract and Release - 10 reps - 3 sets - 10 sec hold - 1x daily - 7x weekly [facility] [address], [place], [place] [address] Phone # [phone] Fax [phone]

## 2018-10-10 NOTE — Therapy (Signed)
[facility] [address], [employer] [place] [place], [place], [address] Phone: [phone]   Fax:  [phone]  Physical Therapy Treatment  Patient Details  Name: Jaclyn Schmidt MRN: [id number] Date of Birth: [birth year] Referring Provider (PT): Dr. Nadeen Landau   Encounter Date: 10/10/2018  PT End of Session - 10/10/18 1704    Visit Number  3    Date for PT Re-Evaluation  11/08/18    Authorization Type  aetna medicare    PT Start Time  1615    PT Stop Time  1655    PT Time Calculation (min)  40 min    Activity Tolerance  Patient tolerated treatment well    Behavior During Therapy  [facility] for tasks assessed/performed       Past Medical History:  Diagnosis Date  . Anxiety   . Cancer ([place]) 04/2018   right breast cancer  . Cholelithiasis   . Depression   . GERD (gastroesophageal reflux disease)   . History of kidney stones   . Hyperlipidemia   . Hypothyroidism   . Irritable bowel syndrome   . Pneumonia   . Thyroid disease   . Vitamin D deficiency     Past Surgical History:  Procedure Laterality Date  . ABDOMINAL HYSTERECTOMY  [id number]  . ANTERIOR CERVICAL DECOMP/DISCECTOMY FUSION N/A 01/21/2017   Procedure: C3-4, C4-5 ANTERIOR CERVICAL DISCECTOMY AND FUSION, ALLOGRAFT, PLATE;  Surgeon: [name], MD;  Location: [facility];  Service: Orthopedics;  Laterality: N/A;  . APPENDECTOMY  1996  . BREAST LUMPECTOMY WITH RADIOACTIVE SEED AND SENTINEL LYMPH NODE BIOPSY Right 05/30/2018   Procedure: RIGHT BREAST LUMPECTOMY WITH RADIOACTIVE SEED AND RIGHT AXILLARY SENTINEL LYMPH NODE BIOPSY;  Surgeon: Alphonsa Overall, MD;  Location: [facility];  Service: General;  Laterality: Right;  . BREAST MASS EXCISION  1997   left  . CHOLECYSTECTOMY  07/15/2011   Procedure: LAPAROSCOPIC CHOLECYSTECTOMY WITH INTRAOPERATIVE CHOLANGIOGRAM;  Surgeon: Shann Medal, MD;  Location: [facility];  Service: General;  Laterality: N/A;  Cholecystectomy  with IOC  . TOE FUSION    . TUBAL LIGATION  1980    There were no vitals filed for this visit.  Subjective Assessment - 10/10/18 1622    Subjective  Friday it was a mess and the stool was runny and slimy. I have lost some weight. I do not want to eat. I take metamucil daily. I only had one accident since last week. I am using the cocout oil on the vaginal area and anus.    Patient Stated Goals  reduce fecal incontinence    Currently in Pain?  No/denies                       [facility] Adult PT Treatment/Exercise - 10/10/18 0001      Self-Care   Self-Care  Other Self-Care Comments    Other Self-Care Comments   diet to eat more fiber, eat less grease, making food with nutrients instead of candy bar, using protein drinks, meal prep ahead      Lumbar Exercises: Aerobic   Nustep  level 2; 5 min; arm and leg while assessing patient      Lumbar Exercises: Seated   Other Seated Lumbar Exercises  sitting resistive hip flexion with pelvic floor contraction to work the lower abdominals    Other Seated Lumbar Exercises  sit with ball squeeze with pelvic floor contraction; sitting hip abduction against the yellow band with  pelvic floor contraction      Lumbar Exercises: Supine   Ab Set  10 reps;5 seconds   with pelvic floor contraction     Modalities   Modalities  Cryotherapy      Cryotherapy   Number Minutes Cryotherapy  10 Minutes    Cryotherapy Location  Upper arm   left due to reaction to flu shot   Type of Cryotherapy  Ice pack   then placed on back due to cramp with ex.            PT Education - 10/10/18 1704    Education Details  Access Code: [email or web address]    Person(s) Educated  Patient    Methods  Explanation;Demonstration;Handout    Comprehension  Returned demonstration;Verbalized understanding       PT Short Term Goals - 10/10/18 1710      PT SHORT TERM GOAL #1   Title  independent with initial HEP    Time  4    Period  Weeks    Status  Achieved     Target Date  10/11/18      PT SHORT TERM GOAL #2   Title  understand correct toileting technique to push stool out    Time  4    Period  Weeks    Status  Achieved    Target Date  10/11/18      PT SHORT TERM GOAL #3   Title  understand how to perform abdominal massage to help with peristalic motion of the large intestines.    Time  4    Period  Weeks    Status  On-going    Target Date  10/11/18      PT SHORT TERM GOAL #4   Title  Pelvic floor strength is 2/5 with anal contraction and no gluteal so her fecal incontinence improved >/= 25%    Time  4    Period  Weeks    Status  Achieved        PT Long Term Goals - 09/13/18 1644      PT LONG TERM GOAL #1   Title  independence with HEP    Time  8    Period  Weeks    Status  New    Target Date  11/08/18      PT LONG TERM GOAL #2   Title  pelvic floor strength >/= 3/5 so she is having 1-0 fecal leakage and does not have to wear a pad    Time  8    Period  Weeks    Status  New    Target Date  11/08/18      PT LONG TERM GOAL #3   Title  patient is able to control her gas with >/= 75% greater ease due to improved pelvic floor coordination    Time  8    Period  Weeks    Status  New    Target Date  11/08/18      PT LONG TERM GOAL #4   Title  Patient is able to go out without worrying about fecal leakage due to improved pelvic floor strength and coordination    Time  8    Period  Weeks    Status  New    Target Date  11/08/18      PT LONG TERM GOAL #5   Title  able to push stool out with >/= 75% greater ease due to improved pelvic floor coordination  Time  8    Period  Weeks    Status  New    Target Date  11/08/18            Plan - 10/10/18 1629    Clinical Impression Statement  Patient has had one accident since last visit. Patient reports she had a pork chop sandwich from [employer] that morning when she had loose slimy stool. Patient diet has been candy bars, tomatoe sandwich with mayo. Patient understands  to eat foods that have more fiber and nutrition. Patient has been consistent with her HEP. She is now adding supine pelvic floor contraction for 10 seconds. Patient needed tactile cues to isolate pelvic floor contraction. Patient is using her Metamucil daily. Patient stools are firmer now. Patient will benefit from skilled therapy to improve pelvic floor coordination, education on vaginal and bowel health and improve strength.    Personal Factors and Comorbidities  Age;Comorbidity 1;Sex;Fitness;Comorbidity 2    Comorbidities  s/p breast cancer; partial hysterectomy    Examination-Activity Limitations  Toileting;Continence    Examination-Participation Restrictions  Community Activity;Shop;Interpersonal Relationship    Stability/Clinical Decision Making  Evolving/Moderate complexity    Rehab Potential  Excellent    PT Frequency  1x / week    PT Duration  8 weeks    PT Treatment/Interventions  Biofeedback;Electrical Stimulation;Therapeutic exercise;Therapeutic activities;Neuromuscular re-education;Patient/family education;Manual techniques    PT Next Visit Plan  pelvic floor contraction with hold then quick flicks; plie with pelvic floor contraction, increase hold in  sitting; abdominal massage    PT Home Exercise Plan  Access Code: [email or web address]    Consulted and Agree with Plan of Care  Patient       Patient will benefit from skilled therapeutic intervention in order to improve the following deficits and impairments:  Decreased range of motion, Increased fascial restricitons, Decreased endurance, Decreased strength, Decreased coordination  Visit Diagnosis: Muscle weakness (generalized)  Other lack of coordination  Incontinence of feces, unspecified fecal incontinence type  Acute low back pain, unspecified back pain laterality, unspecified whether sciatica present     Problem List Patient Active Problem List   Diagnosis Date Noted  . Malignant neoplasm of lower-inner quadrant of right breast  of female, estrogen receptor positive ([place]) 04/26/2018  . Upper respiratory tract infection 12/05/2017  . Vertigo 09/05/2017  . Hypothyroidism 07/18/2017  . Osteopenia 03/09/2017  . Anal sphincter incompetence 12/29/2016  . Depression, recurrent ([place]) 12/29/2016  . Chronic midline thoracic back pain 09/21/2016  . Lumbar pain 09/21/2016  . Gall bladder disease 06/10/2011  . Cholelithiasis 06/03/2011    Earlie Counts, PT 10/10/18 5:12 PM   [facility] [address], [place], [place], [address] Phone: [phone]   Fax:  [phone]  Name: Jaclyn Schmidt MRN: [id number] Date of Birth: 03/27/1950

## 2018-10-13 IMAGING — RF DG CERVICAL SPINE 2 OR 3 VIEWS
1 series · 4 of 4 positions shown · non-contrast
Comparison: MRI cervical spine 12/07/2016.

CLINICAL DATA: Intraoperative imaging for C3-5 ACDF.

EXAM:
CERVICAL SPINE - 2-3 VIEW; DG C-ARM 61-120 MIN

[Series 1: run · 4 of 4 slices shown]
[im 1/4]
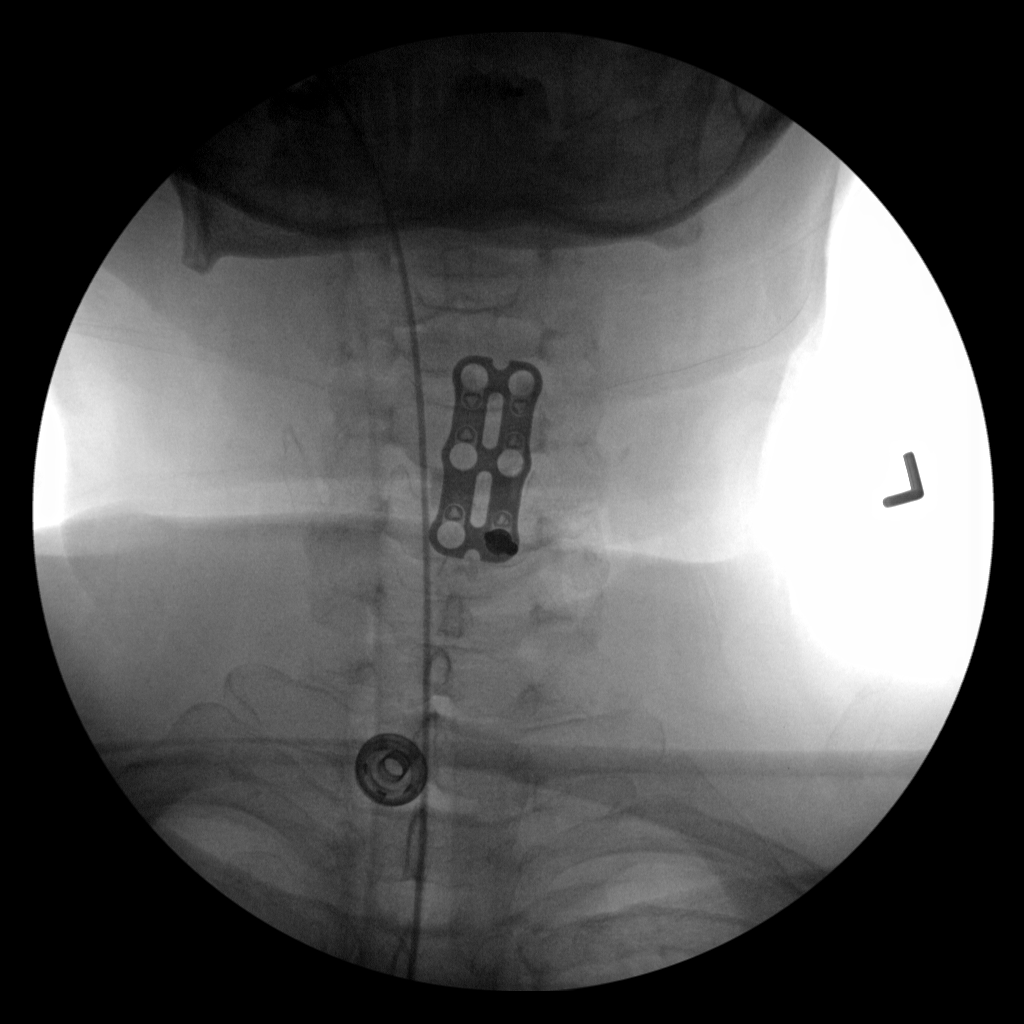
[im 2/4]
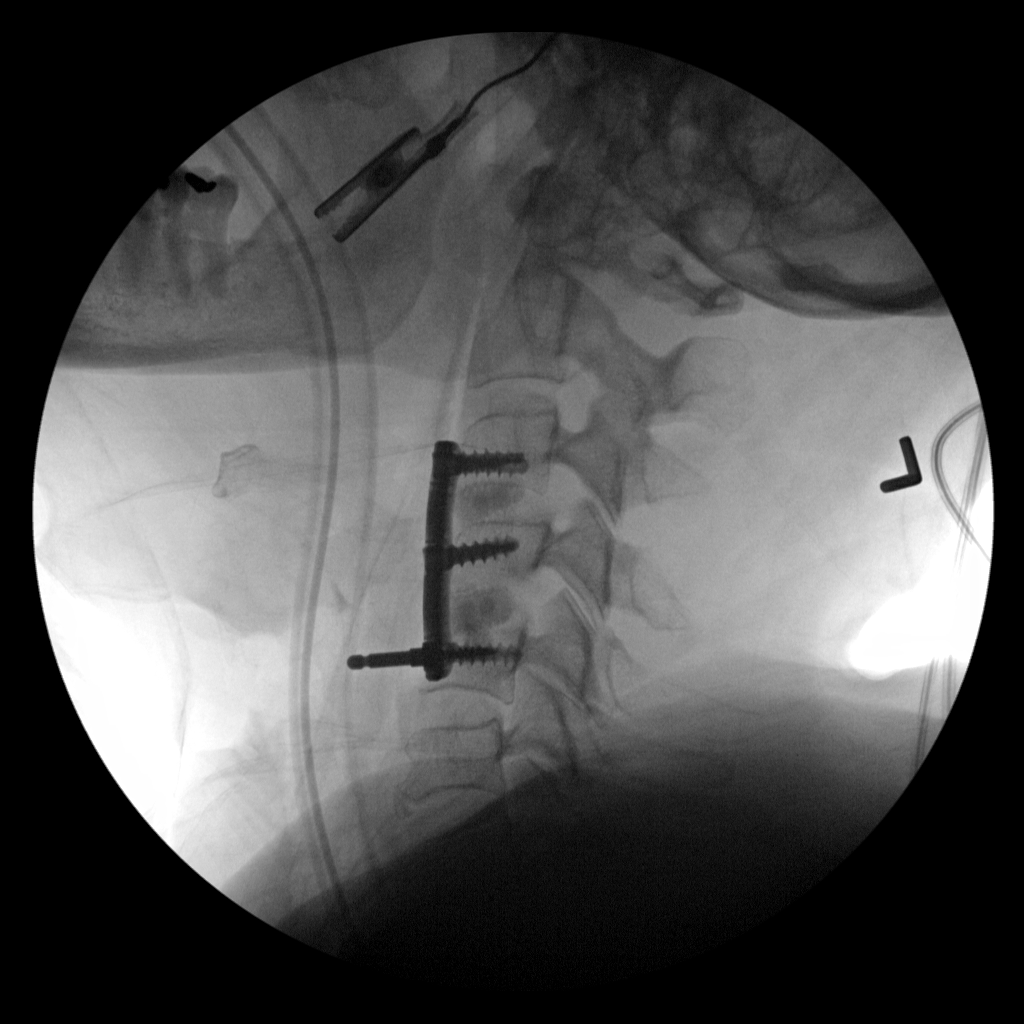
[im 3/4]
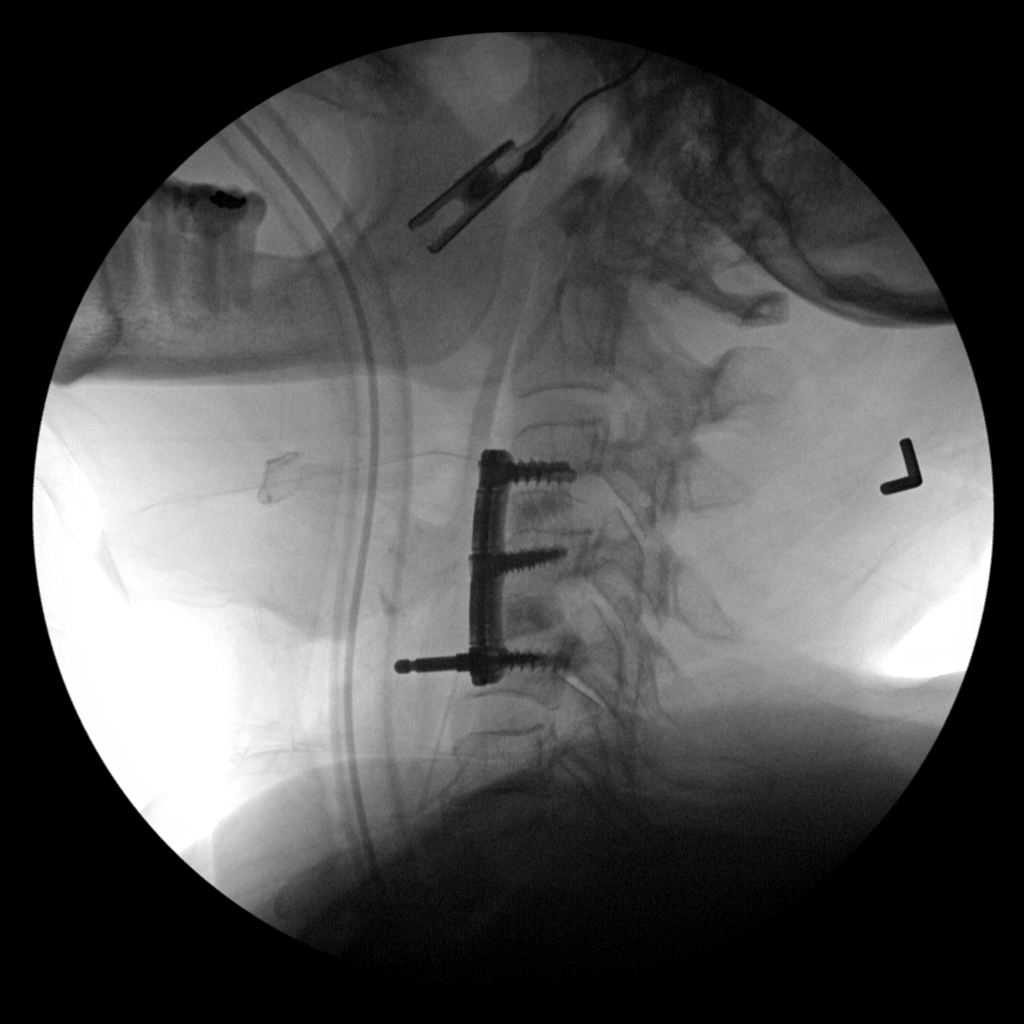
[im 4/4]
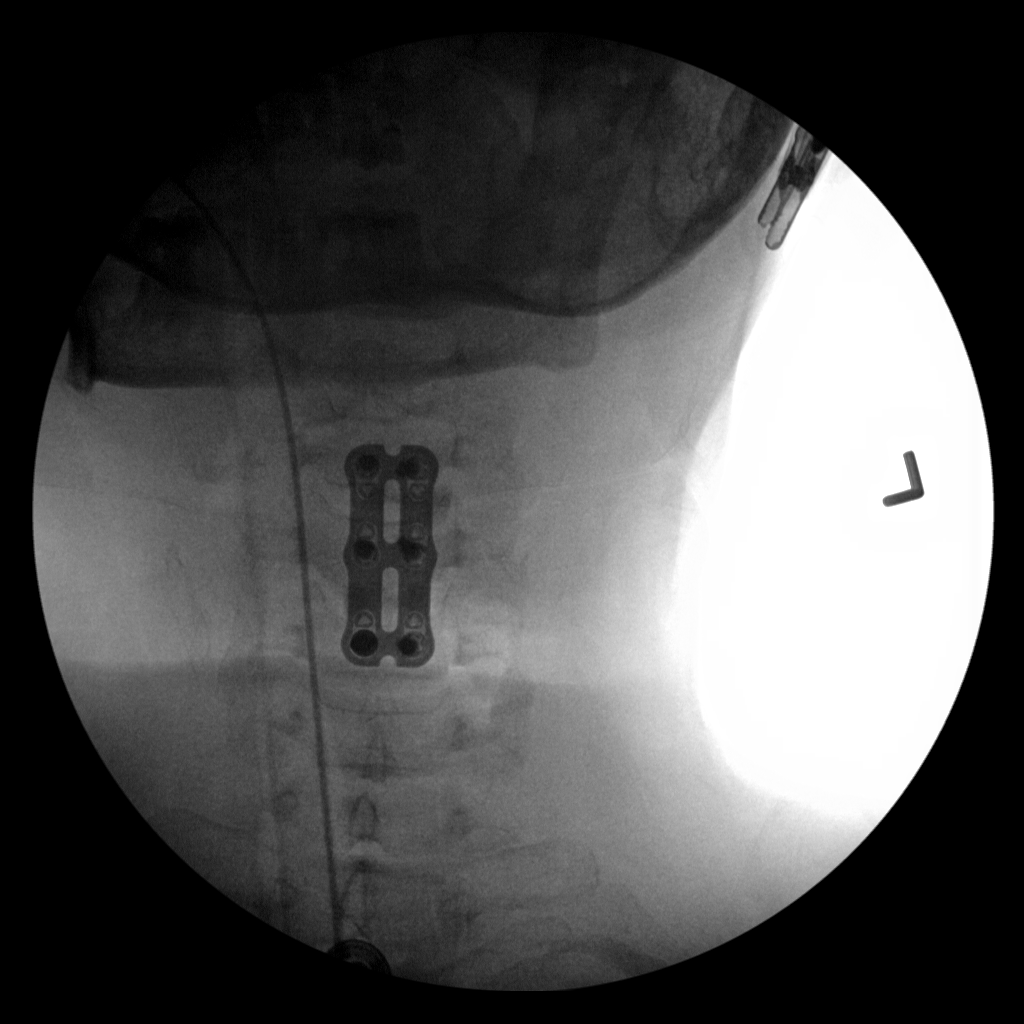

[4 of 4 positions shown; findings below may reference images not displayed]

FINDINGS: We are provided with 4 fluoroscopic intraoperative spot views of the
cervical spine. Images demonstrate anterior plate and screws and
interbody spacers in place from C3-C5. No acute abnormality is
identified.
IMPRESSION: Intraoperative imaging for C3-5 ACDF.

## 2018-10-17 ENCOUNTER — Other Ambulatory Visit: Payer: Self-pay

## 2018-10-17 ENCOUNTER — Encounter: Payer: Self-pay | Admitting: Physical Therapy

## 2018-10-17 ENCOUNTER — Ambulatory Visit: Payer: Medicare HMO | Admitting: Physical Therapy

## 2018-10-17 DIAGNOSIS — R278 Other lack of coordination: Secondary | ICD-10-CM | POA: Diagnosis not present

## 2018-10-17 DIAGNOSIS — R159 Full incontinence of feces: Secondary | ICD-10-CM | POA: Diagnosis not present

## 2018-10-17 DIAGNOSIS — M545 Low back pain: Secondary | ICD-10-CM | POA: Diagnosis not present

## 2018-10-17 DIAGNOSIS — M6281 Muscle weakness (generalized): Secondary | ICD-10-CM | POA: Diagnosis not present

## 2018-10-17 NOTE — Patient Instructions (Signed)
Access Code: [email or web address]  URL: [email or web address]  Date: 10/17/2018  Prepared by: Earlie Counts   Exercises Seated Hip Adduction Squeeze with Diona Foley - 10 reps - 1 sets - 5 sec hold - 2x daily - 7x weekly Seated Hip Abduction with Resistance - 10 reps - 2 sets - 5 sec hold - 1x daily - 7x weekly Seated Heel Raise - 10 reps - 1 sets - 5 sec hold - 1x daily - 7x weekly Supine Pelvic Floor Contract and Release - 10 reps - 1 sets - 15 sec hold - 1x daily - 7x weekly Seated Pelvic Floor Contraction - 10 reps - 1 sets - 10 sec hold - 2x daily - 7x weekly [facility] [address], [place], [place] [address] Phone # [phone] Fax [phone]

## 2018-10-17 NOTE — Therapy (Signed)
[facility] [address], [place], [place], [address] Phone: [phone]   Fax:  [phone]  Physical Therapy Treatment  Patient Details  Name: Jaclyn Schmidt MRN: [id number] Date of Birth: 02-Mar-1950 Referring Provider (PT): Dr. Nadeen Landau   Encounter Date: 10/17/2018  PT End of Session - 10/17/18 1642    Visit Number  4    Date for PT Re-Evaluation  11/08/18    Authorization Type  aetna medicare    PT Start Time  1540    PT Stop Time  1623    PT Time Calculation (min)  43 min    Activity Tolerance  Patient tolerated treatment well    Behavior During Therapy  [facility] for tasks assessed/performed       Past Medical History:  Diagnosis Date  . Anxiety   . Cancer ([place]) 04/2018   right breast cancer  . Cholelithiasis   . Depression   . GERD (gastroesophageal reflux disease)   . History of kidney stones   . Hyperlipidemia   . Hypothyroidism   . Irritable bowel syndrome   . Pneumonia   . Thyroid disease   . Vitamin D deficiency     Past Surgical History:  Procedure Laterality Date  . ABDOMINAL HYSTERECTOMY  [id number]  . ANTERIOR CERVICAL DECOMP/DISCECTOMY FUSION N/A 01/21/2017   Procedure: C3-4, C4-5 ANTERIOR CERVICAL DISCECTOMY AND FUSION, ALLOGRAFT, PLATE;  Surgeon: [name], MD;  Location: [facility];  Service: Orthopedics;  Laterality: N/A;  . APPENDECTOMY  1996  . BREAST LUMPECTOMY WITH RADIOACTIVE SEED AND SENTINEL LYMPH NODE BIOPSY Right 05/30/2018   Procedure: RIGHT BREAST LUMPECTOMY WITH RADIOACTIVE SEED AND RIGHT AXILLARY SENTINEL LYMPH NODE BIOPSY;  Surgeon: Alphonsa Overall, MD;  Location: [facility];  Service: General;  Laterality: Right;  . BREAST MASS EXCISION  1997   left  . CHOLECYSTECTOMY  07/15/2011   Procedure: LAPAROSCOPIC CHOLECYSTECTOMY WITH INTRAOPERATIVE CHOLANGIOGRAM;  Surgeon: Shann Medal, MD;  [facility];  Service: General;  Laterality: N/A;  Cholecystectomy  with IOC  . TOE FUSION    . TUBAL LIGATION  1980    There were no vitals filed for this visit.  Subjective Assessment - 10/17/18 1557    Subjective  I have not had an accident since last visit.    Patient Stated Goals  reduce fecal incontinence    Currently in Pain?  No/denies    Multiple Pain Sites  No         [facility] PT Assessment - 10/17/18 0001      Assessment   Medical Diagnosis  R15.9 Fecal Incontinence    Referring Provider (PT)  Dr. Nadeen Landau    Onset Date/Surgical Date  --   6-7 years   Prior Therapy  none      Precautions   Precautions  Other (comment)    Precaution Comments  breast cancer      Restrictions   Weight Bearing Restrictions  No      Prior Function   Level of Independence  Independent    Leisure  walk 1 hour for 3-4 times per week      Cognition   Overall Cognitive Status  Within Functional Limits for tasks assessed      Posture/Postural Control   Posture/Postural Control  No significant limitations      AROM   Lumbar Flexion  decreased by 25%    Lumbar Extension  decreased by 25%    Lumbar -  Right Side Bend  decreased by 25%    Lumbar - Left Side Bend  decreased by 25%    Lumbar - Right Rotation  decreased by 25%    Lumbar - Left Rotation  decreased by 25%      Strength   Right Hip Flexion  5/5    Right Hip Extension  5/5    Right Hip ABduction  5/5    Left Hip Extension  5/5    Left Hip ABduction  5/5                   [facility] Adult PT Treatment/Exercise - 10/17/18 0001      Lumbar Exercises: Aerobic   Nustep  level 2; 5 min; arm and leg while assessing patient      Lumbar Exercises: Seated   Sit to Stand Limitations  sitting pelvic floor contraction holding for 10 seconds 10 times    Other Seated Lumbar Exercises  sitting resistive hip flexion with pelvic floor contraction to work the lower abdominals    Other Seated Lumbar Exercises  sit with ball squeeze with pelvic floor contraction; sitting hip abduction  against the yellow band with pelvic floor contraction      Lumbar Exercises: Supine   Ab Set  10 reps;5 seconds   with pelvic floor contraction     Manual Therapy   Manual Therapy  Myofascial release    Myofascial Release  release of the lower abdomen, mesenteric root, along the suprapubic area             PT Education - 10/17/18 1637    Education Details  Access Code: [email or web address]    Person(s) Educated  Patient    Methods  Explanation;Demonstration;Verbal cues;Handout    Comprehension  Verbalized understanding;Returned demonstration       PT Short Term Goals - 10/17/18 1602      PT SHORT TERM GOAL #1   Title  independent with initial HEP    Time  4    Period  Weeks    Status  Achieved      PT SHORT TERM GOAL #2   Title  understand correct toileting technique to push stool out    Time  4    Period  Weeks    Status  Achieved    Target Date  10/11/18      PT SHORT TERM GOAL #3   Title  understand how to perform abdominal massage to help with peristalic motion of the large intestines.    Time  4    Period  Weeks    Status  Achieved    Target Date  10/11/18      PT SHORT TERM GOAL #4   Title  Pelvic floor strength is 2/5 with anal contraction and no gluteal so her fecal incontinence improved >/= 25%    Time  4    Period  Weeks    Status  Achieved    Target Date  10/11/18        PT Long Term Goals - 10/17/18 1603      PT LONG TERM GOAL #1   Title  independence with HEP    Time  8    Period  Weeks    Status  Achieved      PT LONG TERM GOAL #2   Title  pelvic floor strength >/= 3/5 so she is having 1-0 fecal leakage and does not have to wear a pad  Baseline  wears a pad just in case. did not test strength due to just having a bowel movement    Time  8    Period  Weeks    Status  Partially Met      PT LONG TERM GOAL #3   Title  patient is able to control her gas with >/= 75% greater ease due to improved pelvic floor coordination    Baseline  depends  what she eats    Time  8    Period  Weeks    Status  Achieved      PT LONG TERM GOAL #4   Title  Patient is able to go out without worrying about fecal leakage due to improved pelvic floor strength and coordination    Baseline  she still wears a pad just in case    Time  8    Period  Weeks    Status  Partially Met      PT LONG TERM GOAL #5   Title  able to push stool out with >/= 75% greater ease due to improved pelvic floor coordination    Time  8    Period  Weeks    Status  Achieved            Plan - 10/17/18 1622    Clinical Impression Statement  Patient is able to  have a bowel movement daily and has not had fecal incontinence since last week. Patient will wear a pad just incase due to not being fully confident. Patient is independent with her HEP and does it daily. Patient stool is  firmer and does not strain to push it out. Patient is happy with her progress and financially would like to be discharged.    Personal Factors and Comorbidities  Age;Comorbidity 1;Sex;Fitness;Comorbidity 2    Comorbidities  s/p breast cancer; partial hysterectomy    Examination-Activity Limitations  Toileting;Continence    Examination-Participation Restrictions  Community Activity;Shop;Interpersonal Relationship    Stability/Clinical Decision Making  Evolving/Moderate complexity    Rehab Potential  Excellent    PT Frequency  1x / week    PT Duration  8 weeks    PT Treatment/Interventions  Biofeedback;Electrical Stimulation;Therapeutic exercise;Therapeutic activities;Neuromuscular re-education;Patient/family education;Manual techniques    PT Next Visit Plan  pelvic floor contraction with hold then quick flicks; plie with pelvic floor contraction, increase hold in  sitting; abdominal massage    PT Home Exercise Plan  Access Code: [email or web address]    Consulted and Agree with Plan of Care  Patient       Patient will benefit from skilled therapeutic intervention in order to improve the following deficits  and impairments:  Decreased range of motion, Increased fascial restricitons, Decreased endurance, Decreased strength, Decreased coordination  Visit Diagnosis: Muscle weakness (generalized)  Other lack of coordination  Incontinence of feces, unspecified fecal incontinence type     Problem List Patient Active Problem List   Diagnosis Date Noted  . Malignant neoplasm of lower-inner quadrant of right breast of female, estrogen receptor positive ([place]) 04/26/2018  . Upper respiratory tract infection 12/05/2017  . Vertigo 09/05/2017  . Hypothyroidism 07/18/2017  . Osteopenia 03/09/2017  . Anal sphincter incompetence 12/29/2016  . Depression, recurrent ([place]) 12/29/2016  . Chronic midline thoracic back pain 09/21/2016  . Lumbar pain 09/21/2016  . Gall bladder disease 06/10/2011  . Cholelithiasis 06/03/2011    Earlie Counts, PT 10/17/18 4:43 PM    [facility] [address]  [place], [place], [address] Phone: [phone]   Fax:  [phone]  Name: AIDYNN POLENDO MRN: [id number] Date of Birth: [birth year]  PHYSICAL THERAPY DISCHARGE SUMMARY  Visits from Start of Care: 4  Current functional level related to goals / functional outcomes: See above   Remaining deficits: See above   Education / Equipment: HEP Plan: Patient agrees to discharge.  Patient goals were partially met. Patient is being discharged due to being pleased with the current functional level.  Patient is also being discharged due to finances. Earlie Counts, PT 10/17/18 4:44 PM  ?????

## 2018-10-19 DIAGNOSIS — Z170 Estrogen receptor positive status [ER+]: Secondary | ICD-10-CM | POA: Diagnosis not present

## 2018-10-19 DIAGNOSIS — R69 Illness, unspecified: Secondary | ICD-10-CM | POA: Diagnosis not present

## 2018-10-19 DIAGNOSIS — C50911 Malignant neoplasm of unspecified site of right female breast: Secondary | ICD-10-CM | POA: Diagnosis not present

## 2018-10-23 ENCOUNTER — Encounter: Payer: Medicare HMO | Admitting: Physical Therapy

## 2018-11-09 ENCOUNTER — Telehealth: Payer: Self-pay | Admitting: Physician Assistant

## 2018-11-09 ENCOUNTER — Other Ambulatory Visit: Payer: Self-pay | Admitting: Physician Assistant

## 2018-11-09 MED ORDER — FLUTICASONE PROPIONATE 50 MCG/ACT NA SUSP: 2.0000 | Freq: Every day | NASAL | 12 refills | Status: DC | PRN

## 2018-11-09 NOTE — Telephone Encounter (Signed)
Med resent patient aware

## 2018-11-09 NOTE — Telephone Encounter (Signed)
What is the name of the medication? fluticasone (FLONASE) 50 MCG/ACT nasal spray  Have you contacted your pharmacy to request a refill? No, pt has upcoming apt with [facility] 12/08/2018  Which pharmacy would you like this sent to? [facility]   Patient notified that their request is being sent to the clinical staff for review and that they should receive a call once it is complete. If they do not receive a call within 24 hours they can check with their pharmacy or our office.

## 2018-11-09 NOTE — Telephone Encounter (Signed)
Pt wants a 3 month supply of fluticasone (FLONASE) 50 MCG/ACT nasal spray because it is the same price for one month supply.

## 2018-11-13 DIAGNOSIS — Z87891 Personal history of nicotine dependence: Secondary | ICD-10-CM | POA: Diagnosis not present

## 2018-11-13 DIAGNOSIS — Z170 Estrogen receptor positive status [ER+]: Secondary | ICD-10-CM | POA: Diagnosis not present

## 2018-11-13 DIAGNOSIS — C50511 Malignant neoplasm of lower-outer quadrant of right female breast: Secondary | ICD-10-CM | POA: Diagnosis not present

## 2018-11-22 DIAGNOSIS — N6489 Other specified disorders of breast: Secondary | ICD-10-CM | POA: Diagnosis not present

## 2018-11-22 DIAGNOSIS — Z170 Estrogen receptor positive status [ER+]: Secondary | ICD-10-CM | POA: Diagnosis not present

## 2018-11-22 DIAGNOSIS — C50911 Malignant neoplasm of unspecified site of right female breast: Secondary | ICD-10-CM | POA: Diagnosis not present

## 2018-11-22 DIAGNOSIS — R69 Illness, unspecified: Secondary | ICD-10-CM | POA: Diagnosis not present

## 2018-12-03 ENCOUNTER — Other Ambulatory Visit: Payer: Self-pay | Admitting: Physician Assistant

## 2018-12-07 DIAGNOSIS — Z20828 Contact with and (suspected) exposure to other viral communicable diseases: Secondary | ICD-10-CM | POA: Diagnosis not present

## 2018-12-08 ENCOUNTER — Ambulatory Visit: Payer: Medicare HMO | Admitting: Physician Assistant

## 2018-12-13 DIAGNOSIS — Z1159 Encounter for screening for other viral diseases: Secondary | ICD-10-CM | POA: Diagnosis not present

## 2018-12-13 DIAGNOSIS — Z03818 Encounter for observation for suspected exposure to other biological agents ruled out: Secondary | ICD-10-CM | POA: Diagnosis not present

## 2018-12-25 ENCOUNTER — Ambulatory Visit: Payer: Medicare HMO | Admitting: Physician Assistant

## 2018-12-26 ENCOUNTER — Encounter: Payer: Self-pay | Admitting: *Deleted

## 2018-12-29 DIAGNOSIS — C50911 Malignant neoplasm of unspecified site of right female breast: Secondary | ICD-10-CM | POA: Diagnosis not present

## 2018-12-29 DIAGNOSIS — N6489 Other specified disorders of breast: Secondary | ICD-10-CM | POA: Diagnosis not present

## 2018-12-29 DIAGNOSIS — Z170 Estrogen receptor positive status [ER+]: Secondary | ICD-10-CM | POA: Diagnosis not present

## 2018-12-29 DIAGNOSIS — R69 Illness, unspecified: Secondary | ICD-10-CM | POA: Diagnosis not present

## 2019-01-08 ENCOUNTER — Other Ambulatory Visit: Payer: Self-pay

## 2019-01-08 DIAGNOSIS — H2513 Age-related nuclear cataract, bilateral: Secondary | ICD-10-CM | POA: Diagnosis not present

## 2019-01-08 DIAGNOSIS — E7800 Pure hypercholesterolemia, unspecified: Secondary | ICD-10-CM | POA: Diagnosis not present

## 2019-01-08 DIAGNOSIS — H5200 Hypermetropia, unspecified eye: Secondary | ICD-10-CM | POA: Diagnosis not present

## 2019-01-09 ENCOUNTER — Encounter: Payer: Self-pay | Admitting: Physician Assistant

## 2019-01-09 ENCOUNTER — Ambulatory Visit (INDEPENDENT_AMBULATORY_CARE_PROVIDER_SITE_OTHER): Payer: Medicare HMO | Admitting: Physician Assistant

## 2019-01-09 VITALS — BP 108/67 | HR 74 | Temp 98.0°F | Ht 63.0 in | Wt 142.6 lb

## 2019-01-09 DIAGNOSIS — M8580 Other specified disorders of bone density and structure, unspecified site: Secondary | ICD-10-CM | POA: Diagnosis not present

## 2019-01-09 DIAGNOSIS — C50311 Malignant neoplasm of lower-inner quadrant of right female breast: Secondary | ICD-10-CM

## 2019-01-09 DIAGNOSIS — F339 Major depressive disorder, recurrent, unspecified: Secondary | ICD-10-CM

## 2019-01-09 DIAGNOSIS — R69 Illness, unspecified: Secondary | ICD-10-CM | POA: Diagnosis not present

## 2019-01-09 DIAGNOSIS — Z170 Estrogen receptor positive status [ER+]: Secondary | ICD-10-CM

## 2019-01-09 DIAGNOSIS — E039 Hypothyroidism, unspecified: Secondary | ICD-10-CM | POA: Diagnosis not present

## 2019-01-09 DIAGNOSIS — Z0000 Encounter for general adult medical examination without abnormal findings: Secondary | ICD-10-CM | POA: Diagnosis not present

## 2019-01-09 MED ORDER — ALPRAZOLAM 1 MG PO TABS: 0.5000 mg | ORAL_TABLET | Freq: Three times a day (TID) | ORAL | 0 refills | Status: DC | PRN

## 2019-01-09 MED ORDER — FLUTICASONE PROPIONATE 50 MCG/ACT NA SUSP: 2.0000 | Freq: Every day | NASAL | 4 refills | Status: DC | PRN

## 2019-01-09 MED ORDER — GABAPENTIN 300 MG PO CAPS: ORAL_CAPSULE | ORAL | 1 refills | Status: DC

## 2019-01-09 NOTE — Progress Notes (Signed)
Acute Office Visit  Subjective:    Patient ID: Jaclyn Schmidt, female    DOB: [birth year], 68 y.o.   MRN: [id number]  Chief Complaint  Patient presents with  . Medical Management of Chronic Issues    6 month     Hyperlipidemia This is a chronic problem. The current episode started more than 1 year ago. The problem is controlled. Recent lipid tests were reviewed and are normal. Pertinent negatives include no chest pain. Current antihyperlipidemic treatment includes statins.  Anxiety Presents for follow-up visit. Symptoms include nervous/anxious behavior. Patient reports no chest pain. Symptoms occur rarely. The quality of sleep is good. Nighttime awakenings: none.    Arthritis Presents for follow-up visit. She complains of pain and stiffness. The symptoms have been stable. Affected locations include the neck. Pertinent negatives include no dysuria, fatigue or fever.    Past Medical History:  Diagnosis Date  . Anxiety   . Cancer ([place]) 04/2018   right breast cancer  . Cholelithiasis   . Depression   . GERD (gastroesophageal reflux disease)   . History of kidney stones   . Hyperlipidemia   . Hypothyroidism   . Irritable bowel syndrome   . Pneumonia   . Thyroid disease   . Vitamin D deficiency     Past Surgical History:  Procedure Laterality Date  . ABDOMINAL HYSTERECTOMY  [id number]  . ANTERIOR CERVICAL DECOMP/DISCECTOMY FUSION N/A 01/21/2017   Procedure: C3-4, C4-5 ANTERIOR CERVICAL DISCECTOMY AND FUSION, ALLOGRAFT, PLATE;  Surgeon: [name], MD;  Location: [place];  Service: Orthopedics;  Laterality: N/A;  . APPENDECTOMY  1996  . BREAST LUMPECTOMY WITH RADIOACTIVE SEED AND SENTINEL LYMPH NODE BIOPSY Right 05/30/2018   Procedure: RIGHT BREAST LUMPECTOMY WITH RADIOACTIVE SEED AND RIGHT AXILLARY SENTINEL LYMPH NODE BIOPSY;  Surgeon: Alphonsa Overall, MD;  Location: [place];  Service: General;  Laterality: Right;  . BREAST MASS EXCISION  1997   left  .  CHOLECYSTECTOMY  07/15/2011   Procedure: LAPAROSCOPIC CHOLECYSTECTOMY WITH INTRAOPERATIVE CHOLANGIOGRAM;  Surgeon: Shann Medal, MD;  Location: [facility];  Service: General;  Laterality: N/A;  Cholecystectomy with IOC  . TOE FUSION    . TUBAL LIGATION  1980    Family History  Problem Relation Age of Onset  . Cancer Mother        stomach  . Heart disease Father   . Cancer Maternal Aunt        ovarian  . Cancer Paternal Aunt        breast    Social History   Socioeconomic History  . Marital status: Divorced    Spouse name: Not on file  . Number of children: 1  . Years of education: 74  . Highest education level: High school graduate  Occupational History  . Occupation: Retired     Fish farm manager: [employer]  Tobacco Use  . Smoking status: Former Smoker    Packs/day: 0.25    Years: 36.00    Pack years: 9.00    Types: Cigarettes    Start date: 09/30/1973    Quit date: 04/26/2018    Years since quitting: 0.7  . Smokeless tobacco: Never Used  Substance and Sexual Activity  . Alcohol use: No    Alcohol/week: 0.0 standard drinks  . Drug use: No  . Sexual activity: Not on file  Other Topics Concern  . Not on file  Social History Narrative  . Not on file   Social Determinants of Health  Financial Resource Strain: Low Risk   . Difficulty of Paying Living Expenses: Not hard at all  Food Insecurity: No Food Insecurity  . Worried About Charity fundraiser in the Last Year: Never true  . Ran Out of Food in the Last Year: Never true  Transportation Needs: No Transportation Needs  . Lack of Transportation (Medical): No  . Lack of Transportation (Non-Medical): No  Physical Activity: Inactive  . Days of Exercise per Week: 0 days  . Minutes of Exercise per Session: 0 min  Stress: No Stress Concern Present  . Feeling of Stress : Not at all  Social Connections: Somewhat Isolated  . Frequency of Communication with Friends and Family: More than three times a week  . Frequency of Social  Gatherings with Friends and Family: More than three times a week  . Attends Religious Services: More than 4 times per year  . Active Member of Clubs or Organizations: No  . Attends Archivist Meetings: Never  . Marital Status: Divorced  Human resources officer Violence: Not At Risk  . Fear of Current or Ex-Partner: No  . Emotionally Abused: No  . Physically Abused: No  . Sexually Abused: No    Outpatient Medications Prior to Visit  Medication Sig Dispense Refill  . ALPRAZolam (XANAX) 1 MG tablet Take 0.5 tablets (0.5 mg total) by mouth 3 (three) times daily. (Patient taking differently: Take 1 mg by mouth 3 (three) times daily as needed (FOR ANXIETY). ) 120 tablet 1  . BIOTIN PO Take 2 tablets by mouth daily.    . cetirizine (ZYRTEC) 10 MG tablet Take 10 mg by mouth daily.    . Cholecalciferol (VITAMIN D3 PO) Take 4,000 Int'l Units by mouth daily.    . fluticasone (FLONASE) 50 MCG/ACT nasal spray Place 2 sprays into both nostrils daily as needed (FOR ALLERGIES.). 16 g 12  . gabapentin (NEURONTIN) 300 MG capsule TAKE 3 CAPSULES (900 MG TOTAL) BY MOUTH AT BEDTIME. 270 capsule 1  . letrozole (FEMARA) 2.5 MG tablet Take 2.5 mg by mouth daily.    Marland Kitchen levothyroxine (SYNTHROID) 75 MCG tablet TAKE 1 TABLET BY MOUTH EVERY DAY 90 tablet 3  . pravastatin (PRAVACHOL) 40 MG tablet Take 1 tablet (40 mg total) by mouth daily. 90 tablet 3  . sertraline (ZOLOFT) 50 MG tablet Take 1 tablet (50 mg total) by mouth daily. (Needs to be seen before next refill) 90 tablet 3  . exemestane (AROMASIN) 25 MG tablet Take 25 mg by mouth daily after breakfast.    . famotidine (PEPCID) 20 MG tablet Take 1 tablet (20 mg total) by mouth 2 (two) times daily for 14 days. 28 tablet 0  . HYDROcodone-acetaminophen (NORCO/VICODIN) 5-325 MG tablet TAKE 1 (ONE) TABLET EVERY SIX HOURS, AS NEEDED    . hydrOXYzine (ATARAX/VISTARIL) 25 MG tablet Take 1 tablet (25 mg total) by mouth 3 (three) times daily as needed. 30 tablet 0  .  predniSONE (DELTASONE) 20 MG tablet 2 po at sametime daily for 5 days 10 tablet 0  . triamcinolone ointment (KENALOG) 0.5 % Apply 1 application topically 2 (two) times daily. 30 g 0   No facility-administered medications prior to visit.    Allergies  Allergen Reactions  . Penicillins Rash and Other (See Comments)    PATIENT HAS HAD A PCN REACTION WITH IMMEDIATE RASH, FACIAL/TONGUE/THROAT SWELLING, SOB, OR LIGHTHEADEDNESS WITH HYPOTENSION:  #  #  #  YES  #  #  #   Has patient  had a PCN reaction causing severe rash involving mucus membranes or skin necrosis: No Has patient had a PCN reaction that required hospitalization: No Has patient had a PCN reaction occurring within the last 10 years: No If all of the above answers are "NO", then may proceed with Cephalosporin use.   Marland Kitchen Doxycycline Rash    Review of Systems  Constitutional: Negative.  Negative for activity change, fatigue and fever.  HENT: Negative.   Eyes: Negative.   Respiratory: Negative.  Negative for cough.   Cardiovascular: Negative.  Negative for chest pain.  Gastrointestinal: Negative.  Negative for abdominal pain.  Endocrine: Negative.   Genitourinary: Negative.  Negative for dysuria.  Musculoskeletal: Positive for arthralgias, arthritis, back pain, neck pain and stiffness.  Skin: Negative.   Neurological: Negative.   Psychiatric/Behavioral: The patient is nervous/anxious.        Objective:    Physical Exam Constitutional:      General: She is not in acute distress.    Appearance: Normal appearance. She is well-developed.  HENT:     Head: Normocephalic and atraumatic.  Cardiovascular:     Rate and Rhythm: Normal rate.  Pulmonary:     Effort: Pulmonary effort is normal.  Skin:    General: Skin is warm and dry.     Findings: No rash.  Neurological:     Mental Status: She is alert and oriented to person, place, and time.     Deep Tendon Reflexes: Reflexes are normal and symmetric.     BP 108/67   Pulse  74   Temp 98 F (36.7 C) (Temporal)   Ht 5\' 3"  (1.6 m)   Wt 142 lb 9.6 oz (64.7 kg)   SpO2 96%   BMI 25.26 kg/m  Wt Readings from Last 3 Encounters:  01/09/19 142 lb 9.6 oz (64.7 kg)  07/03/18 147 lb 12.8 oz (67 kg)  06/21/18 148 lb (67.1 kg)    Health Maintenance Due  Topic Date Due  . Hepatitis C Screening  09[birth year]  . PNA vac Low Risk Adult (2 of 2 - PPSV23) 11/08/2018    There are no preventive care reminders to display for this patient.   Lab Results  Component Value Date   TSH 0.516 06/06/2018   Lab Results  Component Value Date   WBC 6.3 06/06/2018   HGB 14.2 06/06/2018   HCT 41.6 06/06/2018   MCV 89 06/06/2018   PLT 298 06/06/2018   Lab Results  Component Value Date   NA 139 07/03/2018   K 4.3 07/03/2018   CO2 23 07/03/2018   GLUCOSE 93 07/03/2018   BUN 11 07/03/2018   CREATININE 0.76 07/03/2018   BILITOT 0.5 06/06/2018   ALKPHOS 87 06/06/2018   AST 20 06/06/2018   ALT 21 06/06/2018   PROT 6.4 06/06/2018   ALBUMIN 4.3 06/06/2018   CALCIUM 9.4 07/03/2018   ANIONGAP 11 01/12/2017   Lab Results  Component Value Date   CHOL 190 06/06/2018   Lab Results  Component Value Date   HDL 40 06/06/2018   Lab Results  Component Value Date   LDLCALC 109 (H) 06/06/2018   Lab Results  Component Value Date   TRIG 204 (H) 06/06/2018   Lab Results  Component Value Date   CHOLHDL 4.8 (H) 06/06/2018   No results found for: HGBA1C     Assessment & Plan:   Problem List Items Addressed This Visit      Endocrine   Hypothyroidism - Primary  Musculoskeletal and Integument   Osteopenia     Other   Depression, recurrent (HCC)   Malignant neoplasm of lower-inner quadrant of right breast of female, estrogen receptor positive ([place])   Relevant Medications   letrozole (FEMARA) 2.5 MG tablet       No orders of the defined types were placed in this encounter.    [name], PA-C

## 2019-01-10 LAB — CBC WITH DIFFERENTIAL/PLATELET
Basophils Absolute: 0 10*3/uL (ref 0.0–0.2)
Basos: 1 %
EOS (ABSOLUTE): 0.2 10*3/uL (ref 0.0–0.4)
Eos: 5 %
Hematocrit: 40.1 % (ref 34.0–46.6)
Hemoglobin: 13.5 g/dL (ref 11.1–15.9)
Immature Grans (Abs): 0 10*3/uL (ref 0.0–0.1)
Immature Granulocytes: 1 %
Lymphocytes Absolute: 1.5 10*3/uL (ref 0.7–3.1)
Lymphs: 41 %
MCH: 30.7 pg (ref 26.6–33.0)
MCHC: 33.7 g/dL (ref 31.5–35.7)
MCV: 91 fL (ref 79–97)
Monocytes Absolute: 0.6 10*3/uL (ref 0.1–0.9)
Monocytes: 16 %
Neutrophils Absolute: 1.3 10*3/uL — ABNORMAL LOW (ref 1.4–7.0)
Neutrophils: 36 %
Platelets: 253 10*3/uL (ref 150–450)
RBC: 4.4 x10E6/uL (ref 3.77–5.28)
RDW: 12.2 % (ref 11.7–15.4)
WBC: 3.6 10*3/uL (ref 3.4–10.8)

## 2019-01-10 LAB — LIPID PANEL
Chol/HDL Ratio: 3.6 ratio (ref 0.0–4.4)
Cholesterol, Total: 156 mg/dL (ref 100–199)
HDL: 43 mg/dL (ref >39)
LDL Chol Calc (NIH): 93 mg/dL (ref 0–99)
Triglycerides: 109 mg/dL (ref 0–149)
VLDL Cholesterol Cal: 20 mg/dL (ref 5–40)

## 2019-01-10 LAB — CMP14+EGFR
ALT: 13 IU/L (ref 0–32)
AST: 19 IU/L (ref 0–40)
Albumin/Globulin Ratio: 2.3 — ABNORMAL HIGH (ref 1.2–2.2)
Albumin: 4.3 g/dL (ref 3.8–4.8)
Alkaline Phosphatase: 94 IU/L (ref 39–117)
BUN/Creatinine Ratio: 17 (ref 12–28)
BUN: 10 mg/dL (ref 8–27)
Bilirubin Total: 0.5 mg/dL (ref 0.0–1.2)
CO2: 24 mmol/L (ref 20–29)
Calcium: 9.2 mg/dL (ref 8.7–10.3)
Chloride: 105 mmol/L (ref 96–106)
Creatinine, Ser: 0.6 mg/dL (ref 0.57–1.00)
GFR calc Af Amer: 108 mL/min/{1.73_m2} (ref >59)
GFR calc non Af Amer: 94 mL/min/{1.73_m2} (ref >59)
Globulin, Total: 1.9 g/dL (ref 1.5–4.5)
Glucose: 96 mg/dL (ref 65–99)
Potassium: 4.3 mmol/L (ref 3.5–5.2)
Sodium: 140 mmol/L (ref 134–144)
Total Protein: 6.2 g/dL (ref 6.0–8.5)

## 2019-01-10 LAB — TSH: TSH: 1.03 u[IU]/mL (ref 0.450–4.500)

## 2019-01-16 ENCOUNTER — Encounter: Payer: Self-pay | Admitting: *Deleted

## 2019-01-22 DIAGNOSIS — R69 Illness, unspecified: Secondary | ICD-10-CM | POA: Diagnosis not present

## 2019-01-23 ENCOUNTER — Encounter: Payer: Self-pay | Admitting: Physician Assistant

## 2019-01-23 ENCOUNTER — Ambulatory Visit (INDEPENDENT_AMBULATORY_CARE_PROVIDER_SITE_OTHER): Payer: Medicare HMO | Admitting: Physician Assistant

## 2019-01-23 ENCOUNTER — Other Ambulatory Visit: Payer: Self-pay

## 2019-01-23 ENCOUNTER — Ambulatory Visit: Payer: Medicare HMO | Admitting: Physician Assistant

## 2019-01-23 VITALS — BP 123/74 | HR 77 | Temp 98.4°F | Ht 63.0 in | Wt 143.0 lb

## 2019-01-23 DIAGNOSIS — M79674 Pain in right toe(s): Secondary | ICD-10-CM

## 2019-01-23 DIAGNOSIS — L539 Erythematous condition, unspecified: Secondary | ICD-10-CM | POA: Diagnosis not present

## 2019-01-23 MED ORDER — CEPHALEXIN 500 MG PO CAPS: 500.0000 mg | ORAL_CAPSULE | Freq: Four times a day (QID) | ORAL | 0 refills | Status: DC

## 2019-01-23 NOTE — Patient Instructions (Signed)
Thursday 3:30 Irving Shows

## 2019-01-23 NOTE — Progress Notes (Signed)
Acute Office Visit  Subjective:    Patient ID: Jaclyn Schmidt, female    DOB: July 01, 1950, 69 y.o.   MRN: [id number]  Chief Complaint  Patient presents with  . Right great toe red    HPI Patient is in today for worsening type pain of the right great toe.  She states over the last couple days it has greatly increased in pain and redness.  She states it started out with just a couple of spots and then in the last 24 hours has gotten more spread out and in a linear fashion.  It hurts to even put her foot down flat.  A picture of the toe was included with the note.  She does not recall any specific thing getting into her toe or an injury but tries to wear shoes all the time when she is outside and even in her house.  She denies any fever or chills.  Past Medical History:  Diagnosis Date  . Anxiety   . Cancer ([place]) 04/2018   right breast cancer  . Cholelithiasis   . Depression   . GERD (gastroesophageal reflux disease)   . History of kidney stones   . Hyperlipidemia   . Hypothyroidism   . Irritable bowel syndrome   . Pneumonia   . Thyroid disease   . Vitamin D deficiency     Past Surgical History:  Procedure Laterality Date  . ABDOMINAL HYSTERECTOMY  [id number]  . ANTERIOR CERVICAL DECOMP/DISCECTOMY FUSION N/A 01/21/2017   Procedure: C3-4, C4-5 ANTERIOR CERVICAL DISCECTOMY AND FUSION, ALLOGRAFT, PLATE;  Surgeon: [name], MD;  Location: [place];  Service: Orthopedics;  Laterality: N/A;  . APPENDECTOMY  1996  . BREAST LUMPECTOMY WITH RADIOACTIVE SEED AND SENTINEL LYMPH NODE BIOPSY Right 05/30/2018   Procedure: RIGHT BREAST LUMPECTOMY WITH RADIOACTIVE SEED AND RIGHT AXILLARY SENTINEL LYMPH NODE BIOPSY;  Surgeon: Alphonsa Overall, MD;  Location: [place] [facility];  Service: General;  Laterality: Right;  . BREAST MASS EXCISION  1997   left  . CHOLECYSTECTOMY  07/15/2011   Procedure: LAPAROSCOPIC CHOLECYSTECTOMY WITH INTRAOPERATIVE CHOLANGIOGRAM;  Surgeon: Shann Medal, MD;   Location: [facility];  Service: General;  Laterality: N/A;  Cholecystectomy with IOC  . TOE FUSION    . TUBAL LIGATION  1980    Family History  Problem Relation Age of Onset  . Cancer Mother        stomach  . Heart disease Father   . Cancer Maternal Aunt        ovarian  . Cancer Paternal Aunt        breast    Social History   Socioeconomic History  . Marital status: Divorced    Spouse name: Not on file  . Number of children: 1  . Years of education: 51  . Highest education level: High school graduate  Occupational History  . Occupation: Retired     Fish farm manager: [employer]  Tobacco Use  . Smoking status: Former Smoker    Packs/day: 0.25    Years: 36.00    Pack years: 9.00    Types: Cigarettes    Start date: 09/30/1973    Quit date: 04/26/2018    Years since quitting: 0.7  . Smokeless tobacco: Never Used  Substance and Sexual Activity  . Alcohol use: No    Alcohol/week: 0.0 standard drinks  . Drug use: No  . Sexual activity: Not on file  Other Topics Concern  . Not on file  Social History Narrative  . Not on file   Social Determinants of Health   Financial Resource Strain: Low Risk   . Difficulty of Paying Living Expenses: Not hard at all  Food Insecurity: No Food Insecurity  . Worried About Charity fundraiser in the Last Year: Never true  . Ran Out of Food in the Last Year: Never true  Transportation Needs: No Transportation Needs  . Lack of Transportation (Medical): No  . Lack of Transportation (Non-Medical): No  Physical Activity: Inactive  . Days of Exercise per Week: 0 days  . Minutes of Exercise per Session: 0 min  Stress: No Stress Concern Present  . Feeling of Stress : Not at all  Social Connections: Somewhat Isolated  . Frequency of Communication with Friends and Family: More than three times a week  . Frequency of Social Gatherings with Friends and Family: More than three times a week  . Attends Religious Services: More than 4 times per year  . Active  Member of Clubs or Organizations: No  . Attends Archivist Meetings: Never  . Marital Status: Divorced  Human resources officer Violence: Not At Risk  . Fear of Current or Ex-Partner: No  . Emotionally Abused: No  . Physically Abused: No  . Sexually Abused: No    Outpatient Medications Prior to Visit  Medication Sig Dispense Refill  . ALPRAZolam (XANAX) 1 MG tablet Take 0.5-1 tablets (0.5-1 mg total) by mouth 3 (three) times daily as needed (FOR ANXIETY). 90 tablet 0  . BIOTIN PO Take 2 tablets by mouth daily.    . cetirizine (ZYRTEC) 10 MG tablet Take 10 mg by mouth daily.    . Cholecalciferol (VITAMIN D3 PO) Take 4,000 Int'l Units by mouth daily.    . fluticasone (FLONASE) 50 MCG/ACT nasal spray Place 2 sprays into both nostrils daily as needed (FOR ALLERGIES.). 48 g 4  . gabapentin (NEURONTIN) 300 MG capsule TAKE 3 CAPSULES (900 MG TOTAL) BY MOUTH AT BEDTIME. 270 capsule 1  . letrozole (FEMARA) 2.5 MG tablet Take 2.5 mg by mouth daily.    Marland Kitchen levothyroxine (SYNTHROID) 75 MCG tablet TAKE 1 TABLET BY MOUTH EVERY DAY 90 tablet 3  . pravastatin (PRAVACHOL) 40 MG tablet Take 1 tablet (40 mg total) by mouth daily. 90 tablet 3  . sertraline (ZOLOFT) 50 MG tablet Take 1 tablet (50 mg total) by mouth daily. (Needs to be seen before next refill) 90 tablet 3   No facility-administered medications prior to visit.    Allergies  Allergen Reactions  . Penicillins Rash and Other (See Comments)    PATIENT HAS HAD A PCN REACTION WITH IMMEDIATE RASH, FACIAL/TONGUE/THROAT SWELLING, SOB, OR LIGHTHEADEDNESS WITH HYPOTENSION:  #  #  #  YES  #  #  #   Has patient had a PCN reaction causing severe rash involving mucus membranes or skin necrosis: No Has patient had a PCN reaction that required hospitalization: No Has patient had a PCN reaction occurring within the last 10 years: No If all of the above answers are "NO", then may proceed with Cephalosporin use.   Marland Kitchen Doxycycline Rash    Review of Systems   Constitutional: Negative.   HENT: Negative.   Eyes: Negative.   Respiratory: Negative.   Gastrointestinal: Negative.   Genitourinary: Negative.   Musculoskeletal: Positive for gait problem. Negative for joint swelling and myalgias.  Skin: Positive for color change.       Objective:    Physical Exam Constitutional:  General: She is not in acute distress.    Appearance: Normal appearance. She is well-developed.  HENT:     Head: Normocephalic and atraumatic.  Cardiovascular:     Rate and Rhythm: Normal rate.  Pulmonary:     Effort: Pulmonary effort is normal.  Musculoskeletal:     Right foot: Normal range of motion. No deformity.       Feet:  Feet:     Right foot:     Skin integrity: Erythema present. No warmth, callus, dry skin or fissure.     Toenail Condition: Right toenails are normal.  Skin:    General: Skin is warm and dry.     Findings: No rash.  Neurological:     Mental Status: She is alert and oriented to person, place, and time.     Deep Tendon Reflexes: Reflexes are normal and symmetric.     BP 123/74   Pulse 77   Temp 98.4 F (36.9 C) (Temporal)   Ht 5\' 3"  (1.6 m)   Wt 143 lb (64.9 kg)   SpO2 98%   BMI 25.33 kg/m  Wt Readings from Last 3 Encounters:  01/23/19 143 lb (64.9 kg)  01/09/19 142 lb 9.6 oz (64.7 kg)  07/03/18 147 lb 12.8 oz (67 kg)    Health Maintenance Due  Topic Date Due  . Hepatitis C Screening  07/21/50  . PNA vac Low Risk Adult (2 of 2 - PPSV23) 11/08/2018    There are no preventive care reminders to display for this patient.   Lab Results  Component Value Date   TSH 1.030 01/09/2019   Lab Results  Component Value Date   WBC 3.6 01/09/2019   HGB 13.5 01/09/2019   HCT 40.1 01/09/2019   MCV 91 01/09/2019   PLT 253 01/09/2019   Lab Results  Component Value Date   NA 140 01/09/2019   K 4.3 01/09/2019   CO2 24 01/09/2019   GLUCOSE 96 01/09/2019   BUN 10 01/09/2019   CREATININE 0.60 01/09/2019   BILITOT 0.5  01/09/2019   ALKPHOS 94 01/09/2019   AST 19 01/09/2019   ALT 13 01/09/2019   PROT 6.2 01/09/2019   ALBUMIN 4.3 01/09/2019   CALCIUM 9.2 01/09/2019   ANIONGAP 11 01/12/2017   Lab Results  Component Value Date   CHOL 156 01/09/2019   Lab Results  Component Value Date   HDL 43 01/09/2019   Lab Results  Component Value Date   LDLCALC 93 01/09/2019   Lab Results  Component Value Date   TRIG 109 01/09/2019   Lab Results  Component Value Date   CHOLHDL 3.6 01/09/2019   No results found for: HGBA1C     Assessment & Plan:   Problem List Items Addressed This Visit    None    Visit Diagnoses    Toe pain, right    -  Primary   Relevant Medications   cephALEXin (KEFLEX) 500 MG capsule   Redness of skin       Relevant Medications   cephALEXin (KEFLEX) 500 MG capsule     While in the office the patient was able to call Dr. Tally Due office.  She had been a former patient there.  And they were able to get her something in in just another couple of days.  She will take the Keflex and if anything changes she will let us know.  Meds ordered this encounter  Medications  . cephALEXin (KEFLEX) 500 MG capsule  Sig: Take 1 capsule (500 mg total) by mouth 4 (four) times daily.    Dispense:  40 capsule    Refill:  0    Order Specific Question:   Supervising Provider    Answer:   Janora Norlander [id number]     [name], PA-C

## 2019-01-25 DIAGNOSIS — S90851A Superficial foreign body, right foot, initial encounter: Secondary | ICD-10-CM | POA: Diagnosis not present

## 2019-01-28 ENCOUNTER — Encounter: Payer: Self-pay | Admitting: Physician Assistant

## 2019-02-07 ENCOUNTER — Telehealth: Payer: Self-pay | Admitting: Physician Assistant

## 2019-02-07 NOTE — Telephone Encounter (Signed)
Spoke with pt and advised she is taking Gabapentin and that is for neuropathy. Pt voiced understanding.

## 2019-02-19 DIAGNOSIS — C50911 Malignant neoplasm of unspecified site of right female breast: Secondary | ICD-10-CM | POA: Diagnosis not present

## 2019-02-19 DIAGNOSIS — Z170 Estrogen receptor positive status [ER+]: Secondary | ICD-10-CM | POA: Diagnosis not present

## 2019-02-20 ENCOUNTER — Other Ambulatory Visit: Payer: Self-pay | Admitting: Student

## 2019-02-20 ENCOUNTER — Other Ambulatory Visit: Payer: Self-pay

## 2019-02-20 ENCOUNTER — Ambulatory Visit
Admission: RE | Admit: 2019-02-20 | Discharge: 2019-02-20 | Disposition: A | Discharge status: Home / Self Care | Payer: Medicare HMO | Source: Ambulatory Visit | Attending: Student | Admitting: Student

## 2019-02-20 ENCOUNTER — Other Ambulatory Visit: Payer: Self-pay | Admitting: Surgery

## 2019-02-20 DIAGNOSIS — N644 Mastodynia: Secondary | ICD-10-CM

## 2019-02-20 DIAGNOSIS — R922 Inconclusive mammogram: Secondary | ICD-10-CM | POA: Diagnosis not present

## 2019-02-21 DIAGNOSIS — Z23 Encounter for immunization: Secondary | ICD-10-CM | POA: Diagnosis not present

## 2019-03-12 DIAGNOSIS — M19011 Primary osteoarthritis, right shoulder: Secondary | ICD-10-CM | POA: Diagnosis not present

## 2019-03-12 DIAGNOSIS — M7541 Impingement syndrome of right shoulder: Secondary | ICD-10-CM | POA: Diagnosis not present

## 2019-03-13 ENCOUNTER — Ambulatory Visit (INDEPENDENT_AMBULATORY_CARE_PROVIDER_SITE_OTHER): Payer: Medicare HMO | Admitting: *Deleted

## 2019-03-13 ENCOUNTER — Telehealth: Payer: Self-pay | Admitting: Physician Assistant

## 2019-03-13 DIAGNOSIS — Z0000 Encounter for general adult medical examination without abnormal findings: Secondary | ICD-10-CM

## 2019-03-13 NOTE — Telephone Encounter (Signed)
Appointment made for patient.

## 2019-03-13 NOTE — Progress Notes (Signed)
MEDICARE ANNUAL WELLNESS VISIT  03/13/2019  Telephone Visit Disclaimer This Medicare AWV was conducted by telephone due to national recommendations for restrictions regarding the COVID-19 Pandemic (e.g. social distancing).  I verified, using two identifiers, that I am speaking with Jaclyn Schmidt or their authorized healthcare agent. I discussed the limitations, risks, security, and privacy concerns of performing an evaluation and management service by telephone and the potential availability of an in-person appointment in the future. The patient expressed understanding and agreed to proceed.   Subjective:  Jaclyn Schmidt is a 69 y.o. female patient of [name], PA-C who had a Medicare Annual Wellness Visit today via telephone. Jaclyn Schmidt is Retired from [employer] fund[employer], currently works as a Actuary and lives alone. she has 1 child. she reports that she is socially active and does interact with friends/family regularly. she is minimally physically active and enjoys dancing and going out to eat.  Patient Care Team: [name] as PCP - General (General Practice) [name], PA-C as Referring Physician (General Practice) [name], MD (Inactive) as Consulting Physician (Gastroenterology) [name], MD as Consulting Physician (Orthopedic Surgery) [name], RN as Oncology Nurse Navigator [name], RN as Oncology Nurse Navigator [name], MD as Consulting Physician (Oncology) [name], MD as Consulting Physician (Radiation Oncology)  Advanced Directives 03/13/2019 09/13/2018 05/30/2018 05/24/2018 02/07/2018 02/01/2018 01/12/2017  Does Patient Have a Medical Advance Directive? No No No No No No No  Type of Advance Directive - - - - - - -  Would patient like information on creating a medical advance directive? No - Patient declined No - Patient declined No - Patient declined No - Patient declined No - Patient declined - No - Patient declined    Pre-existing out of facility DNR order (yellow form or pink MOST form) - - - - - - -    Hospital Utilization Over the Past 12 Months: # of hospitalizations or ER visits: 1 # of surgeries: 1  Review of Systems    Patient reports that her overall health is unchanged compared to last year.  History obtained from the patient and patient chart.   Patient Reported Readings (BP, Pulse, CBG, Weight, etc) none  Pain Assessment Pain : 0-10 Pain Score: 9  Pain Type: Acute pain Pain Location: Shoulder Pain Orientation: Right Pain Descriptors / Indicators: Sharp Pain Onset: More than a month ago Pain Frequency: Constant Pain Relieving Factors: nothing Effect of Pain on Daily Activities: painful to do things  Pain Relieving Factors: nothing  Current Medications & Allergies (verified) Allergies as of 03/13/2019      Reactions   Penicillins Rash, Other (See Comments)   PATIENT HAS HAD A PCN REACTION WITH IMMEDIATE RASH, FACIAL/TONGUE/THROAT SWELLING, SOB, OR LIGHTHEADEDNESS WITH HYPOTENSION:  #  #  #  YES  #  #  #   Has patient had a PCN reaction causing severe rash involving mucus membranes or skin necrosis: No Has patient had a PCN reaction that required hospitalization: No Has patient had a PCN reaction occurring within the last 10 years: No If all of the above answers are "NO", then may proceed with Cephalosporin use.   Doxycycline Rash      Medication List       Accurate as of March 13, 2019  9:06 AM. If you have any questions, ask your nurse or doctor.        STOP taking these medications   cephALEXin  500 MG capsule Commonly known as: KEFLEX     TAKE these medications   ALPRAZolam 1 MG tablet Commonly known as: XANAX Take 0.5-1 tablets (0.5-1 mg total) by mouth 3 (three) times daily as needed (FOR ANXIETY).   BIOTIN PO Take 2 tablets by mouth daily.   cetirizine 10 MG tablet Commonly known as: ZYRTEC Take 10 mg by mouth daily.   fluticasone 50 MCG/ACT nasal  spray Commonly known as: FLONASE Place 2 sprays into both nostrils daily as needed (FOR ALLERGIES.).   gabapentin 300 MG capsule Commonly known as: NEURONTIN TAKE 3 CAPSULES (900 MG TOTAL) BY MOUTH AT BEDTIME. What changed:   how much to take  additional instructions   letrozole 2.5 MG tablet Commonly known as: FEMARA Take 2.5 mg by mouth daily.   levothyroxine 75 MCG tablet Commonly known as: SYNTHROID TAKE 1 TABLET BY MOUTH EVERY DAY   pravastatin 40 MG tablet Commonly known as: PRAVACHOL Take 1 tablet (40 mg total) by mouth daily.   sertraline 50 MG tablet Commonly known as: ZOLOFT Take 1 tablet (50 mg total) by mouth daily. (Needs to be seen before next refill)   VITAMIN D3 PO Take 4,000 Int'l Units by mouth daily.       History (reviewed): Past Medical History:  Diagnosis Date  . Anxiety   . Breast cancer ([place]) 05/2018   Right Breast Cancer  . Cancer ([place]) 04/2018   right breast cancer  . Cholelithiasis   . Depression   . GERD (gastroesophageal reflux disease)   . History of kidney stones   . Hyperlipidemia   . Hypothyroidism   . Irritable bowel syndrome   . Personal history of radiation therapy 2020   Right Breast Cancer  . Pneumonia   . Thyroid disease   . Vitamin D deficiency    Past Surgical History:  Procedure Laterality Date  . ABDOMINAL HYSTERECTOMY  [id number]  . ANTERIOR CERVICAL DECOMP/DISCECTOMY FUSION N/A 01/21/2017   Procedure: C3-4, C4-5 ANTERIOR CERVICAL DISCECTOMY AND FUSION, ALLOGRAFT, PLATE;  Surgeon: [name], MD;  Location: [facility];  Service: Orthopedics;  Laterality: N/A;  . APPENDECTOMY  1996  . BREAST LUMPECTOMY Right 05/30/2018  . BREAST LUMPECTOMY WITH RADIOACTIVE SEED AND SENTINEL LYMPH NODE BIOPSY Right 05/30/2018   Procedure: RIGHT BREAST LUMPECTOMY WITH RADIOACTIVE SEED AND RIGHT AXILLARY SENTINEL LYMPH NODE BIOPSY;  Surgeon: Alphonsa Overall, MD;  Location: [facility];  Service: General;  Laterality: Right;    . BREAST MASS EXCISION  1997   left  . CHOLECYSTECTOMY  07/15/2011   Procedure: LAPAROSCOPIC CHOLECYSTECTOMY WITH INTRAOPERATIVE CHOLANGIOGRAM;  Surgeon: Shann Medal, MD;  Location: [facility];  Service: General;  Laterality: N/A;  Cholecystectomy with IOC  . TOE FUSION    . TUBAL LIGATION  1980   Family History  Problem Relation Age of Onset  . Cancer Mother        stomach  . Heart disease Father   . Cancer Maternal Aunt        ovarian  . Cancer Paternal Aunt        breast  . Diabetes Brother   . Hypertension Brother    Social History   Socioeconomic History  . Marital status: Divorced    Spouse name: Not on file  . Number of children: 1  . Years of education: 63  . Highest education level: High school graduate  Occupational History  . Occupation: Retired     Fish farm manager: [employer]  .  Occupation: full time    Comment: sitter  Tobacco Use  . Smoking status: Current Some Day Smoker    Packs/day: 0.25    Years: 36.00    Pack years: 9.00    Types: Cigarettes    Start date: 09/30/1973    Last attempt to quit: 04/26/2018    Years since quitting: 0.8  . Smokeless tobacco: Never Used  . Tobacco comment: a pack per month  Substance and Sexual Activity  . Alcohol use: No    Alcohol/week: 0.0 standard drinks  . Drug use: No  . Sexual activity: Not on file  Other Topics Concern  . Not on file  Social History Narrative   3 grandchildren. Enjoys dancing and going out to eat.    Social Determinants of Health   Financial Resource Strain:   . Difficulty of Paying Living Expenses: Not on file  Food Insecurity:   . Worried About Charity fundraiser in the Last Year: Not on file  . Ran Out of Food in the Last Year: Not on file  Transportation Needs:   . Lack of Transportation (Medical): Not on file  . Lack of Transportation (Non-Medical): Not on file  Physical Activity:   . Days of Exercise per Week: Not on file  . Minutes of Exercise per Session: Not on file  Stress:   .  Feeling of Stress : Not on file  Social Connections:   . Frequency of Communication with Friends and Family: Not on file  . Frequency of Social Gatherings with Friends and Family: Not on file  . Attends Religious Services: Not on file  . Active Member of Clubs or Organizations: Not on file  . Attends Archivist Meetings: Not on file  . Marital Status: Not on file    Activities of Daily Living In your present state of health, do you have any difficulty performing the following activities: 03/13/2019 05/30/2018  Hearing? N N  Vision? N N  Difficulty concentrating or making decisions? Y N  Walking or climbing stairs? N N  Dressing or bathing? N N  Doing errands, shopping? N -  Preparing Food and eating ? N -  Using the Toilet? N -  In the past six months, have you accidently leaked urine? N -  Do you have problems with loss of bowel control? Y -  Comment has been to physical therapist -  Managing your Medications? N -  Managing your Finances? N -  Housekeeping or managing your Housekeeping? N -  Some recent data might be hidden    Patient Education/ Literacy How often do you need to have someone help you when you read instructions, pamphlets, or other written materials from your doctor or pharmacy?: 1 - Never What is the last grade level you completed in school?: 12  Exercise Current Exercise Habits: The patient does not participate in regular exercise at present, Exercise limited by: None identified  Diet Patient reports consuming 3 meals a day and 1 snack(s) a day Patient reports that her primary diet is: Regular Patient reports that she does have regular access to food.   Depression Screen PHQ 2/9 Scores 03/13/2019 01/23/2019 01/09/2019 07/03/2018 06/21/2018 06/06/2018 02/07/2018  PHQ - 2 Score 0 0 0 0 0 0 0  PHQ- 9 Score 0 - - - - - -     Fall Risk Fall Risk  03/13/2019 01/23/2019 06/21/2018 06/06/2018 02/07/2018  Falls in the past year? 0 0 0 0 0  Number  falls in past yr:  - - - - -  Injury with Fall? - - - - -     Objective:  Elleni Chiarenza Dimaano seemed alert and oriented and she participated appropriately during our telephone visit.  Blood Pressure Weight BMI  BP Readings from Last 3 Encounters:  01/23/19 123/74  01/09/19 108/67  07/03/18 116/80   Wt Readings from Last 3 Encounters:  01/23/19 143 lb (64.9 kg)  01/09/19 142 lb 9.6 oz (64.7 kg)  07/03/18 147 lb 12.8 oz (67 kg)   BMI Readings from Last 1 Encounters:  01/23/19 25.33 kg/m    *Unable to obtain current vital signs, weight, and BMI due to telephone visit type  Hearing/Vision  . Thy did not seem to have difficulty with hearing/understanding during the telephone conversation . Reports that she has had a formal eye exam by an eye care professional within the past year . Reports that she has not had a formal hearing evaluation within the past year *Unable to fully assess hearing and vision during telephone visit type  Cognitive Function: 6CIT Screen 03/13/2019  What Year? 0 points  What month? 0 points  What time? 0 points  Count back from 20 0 points  Months in reverse 0 points  Repeat phrase 0 points  Total Score 0   (Normal:0-7, Significant for Dysfunction: >8)  Normal Cognitive Function Screening: Yes   Immunization & Health Maintenance Record Immunization History  Administered Date(s) Administered  . Influenza Inj Mdck Quad Pf 10/19/2016  . Influenza, High Dose Seasonal PF 11/07/2017, 10/09/2018  . Influenza-Unspecified 10/30/2015  . Pneumococcal Conjugate-13 11/07/2017  . Zoster 10/29/2014  . Zoster Recombinat (Shingrix) 02/10/2018    Health Maintenance  Topic Date Due  . Hepatitis C Screening  03-13-1950  . TETANUS/TDAP  09/30/1969  . PNA vac Low Risk Adult (2 of 2 - PPSV23) 11/08/2018  . DEXA SCAN  03/11/2019  . MAMMOGRAM  03/19/2020  . COLONOSCOPY  02/16/2021  . INFLUENZA VACCINE  Completed       Assessment  This is a routine wellness examination for KHADY DUWE.  Health Maintenance: Due or Overdue Health Maintenance Due  Topic Date Due  . Hepatitis C Screening  [birth year]  . TETANUS/TDAP  09/30/1969  . PNA vac Low Risk Adult (2 of 2 - PPSV23) 11/08/2018  . DEXA SCAN  03/11/2019    Jaclyn Schmidt does not need a referral for Community Assistance: Care Management:   no Social Work:    no Prescription Assistance:  no Nutrition/Diabetes Education:  no   Plan:  Personalized Goals Goals Addressed   None    Personalized Health Maintenance & Screening Recommendations  Pneumococcal vaccine   Lung Cancer Screening Recommended: yes (Low Dose CT Chest recommended if Age 5-80 years, 30 pack-year currently smoking OR have quit w/in past 15 years) Hepatitis C Screening recommended: yes HIV Screening recommended: no  Advanced Directives: Written information was not prepared per patient's request.  Referrals & Orders No orders of the defined types were placed in this encounter.   Follow-up Plan . Follow-up with [name], PA-C as planned . Schedule Dexascan .    I have personally reviewed and noted the following in the patient's chart:   . Medical and social history . Use of alcohol, tobacco or illicit drugs  . Current medications and supplements . Functional ability and status . Nutritional status . Physical activity . Advanced directives . List of other physicians . Hospitalizations, surgeries, and  ER visits in previous 12 months . Vitals . Screenings to include cognitive, depression, and falls . Referrals and appointments  In addition, I have reviewed and discussed with Jaclyn Schmidt certain preventive protocols, quality metrics, and best practice recommendations. A written personalized care plan for preventive services as well as general preventive health recommendations is available and can be mailed to the patient at her request.      Baldomero Lamy, LPN [id number]

## 2019-03-19 DIAGNOSIS — C50511 Malignant neoplasm of lower-outer quadrant of right female breast: Secondary | ICD-10-CM | POA: Diagnosis not present

## 2019-03-19 DIAGNOSIS — Z923 Personal history of irradiation: Secondary | ICD-10-CM | POA: Diagnosis not present

## 2019-03-19 DIAGNOSIS — Z170 Estrogen receptor positive status [ER+]: Secondary | ICD-10-CM | POA: Diagnosis not present

## 2019-03-19 DIAGNOSIS — Z79811 Long term (current) use of aromatase inhibitors: Secondary | ICD-10-CM | POA: Diagnosis not present

## 2019-03-19 DIAGNOSIS — Z87891 Personal history of nicotine dependence: Secondary | ICD-10-CM | POA: Diagnosis not present

## 2019-03-30 ENCOUNTER — Other Ambulatory Visit (INDEPENDENT_AMBULATORY_CARE_PROVIDER_SITE_OTHER): Payer: Medicare HMO

## 2019-03-30 ENCOUNTER — Other Ambulatory Visit: Payer: Self-pay

## 2019-03-30 DIAGNOSIS — Z780 Asymptomatic menopausal state: Secondary | ICD-10-CM

## 2019-03-30 DIAGNOSIS — M8580 Other specified disorders of bone density and structure, unspecified site: Secondary | ICD-10-CM

## 2019-03-30 DIAGNOSIS — M8588 Other specified disorders of bone density and structure, other site: Secondary | ICD-10-CM | POA: Diagnosis not present

## 2019-03-30 DIAGNOSIS — M8589 Other specified disorders of bone density and structure, multiple sites: Secondary | ICD-10-CM | POA: Diagnosis not present

## 2019-04-09 DIAGNOSIS — M7541 Impingement syndrome of right shoulder: Secondary | ICD-10-CM | POA: Diagnosis not present

## 2019-04-13 DIAGNOSIS — R69 Illness, unspecified: Secondary | ICD-10-CM | POA: Diagnosis not present

## 2019-04-13 DIAGNOSIS — C50911 Malignant neoplasm of unspecified site of right female breast: Secondary | ICD-10-CM | POA: Diagnosis not present

## 2019-04-13 DIAGNOSIS — Z170 Estrogen receptor positive status [ER+]: Secondary | ICD-10-CM | POA: Diagnosis not present

## 2019-05-02 ENCOUNTER — Encounter: Payer: Self-pay | Admitting: Family Medicine

## 2019-05-02 ENCOUNTER — Ambulatory Visit (INDEPENDENT_AMBULATORY_CARE_PROVIDER_SITE_OTHER): Payer: Medicare HMO | Admitting: Family Medicine

## 2019-05-02 DIAGNOSIS — N644 Mastodynia: Secondary | ICD-10-CM | POA: Diagnosis not present

## 2019-05-02 DIAGNOSIS — R3989 Other symptoms and signs involving the genitourinary system: Secondary | ICD-10-CM

## 2019-05-02 DIAGNOSIS — Z853 Personal history of malignant neoplasm of breast: Secondary | ICD-10-CM | POA: Diagnosis not present

## 2019-05-02 DIAGNOSIS — R350 Frequency of micturition: Secondary | ICD-10-CM

## 2019-05-02 DIAGNOSIS — R922 Inconclusive mammogram: Secondary | ICD-10-CM | POA: Diagnosis not present

## 2019-05-02 MED ORDER — SULFAMETHOXAZOLE-TRIMETHOPRIM 800-160 MG PO TABS: 1.0000 | ORAL_TABLET | Freq: Two times a day (BID) | ORAL | 0 refills | Status: DC

## 2019-05-02 MED ORDER — SULFAMETHOXAZOLE-TRIMETHOPRIM 800-160 MG PO TABS: 1.0000 | ORAL_TABLET | Freq: Two times a day (BID) | ORAL | 0 refills | Status: AC

## 2019-05-02 NOTE — Progress Notes (Signed)
Virtual Visit via telephone Note Due to COVID-19 pandemic this visit was conducted virtually. This visit type was conducted due to national recommendations for restrictions regarding the COVID-19 Pandemic (e.g. social distancing, sheltering in place) in an effort to limit this patient's exposure and mitigate transmission in our community. All issues noted in this document were discussed and addressed.  A physical exam was not performed with this format.   I connected with Jaclyn Schmidt on 05/02/2019 at 1545 by telephone and verified that I am speaking with the correct person using two identifiers. Jaclyn Schmidt is currently located at home and no one is currently with them during visit. The provider, Monia Pouch, FNP is located in their office at time of visit.  I discussed the limitations, risks, security and privacy concerns of performing an evaluation and management service by telephone and the availability of in person appointments. I also discussed with the patient that there may be a patient responsible charge related to this service. The patient expressed understanding and agreed to proceed.  Subjective:  Patient ID: Jaclyn Schmidt, female    DOB: 06/11/1950, 69 y.o.   MRN: [id number]  Chief Complaint:  Urinary Tract Infection   HPI: Jaclyn Schmidt is a 69 y.o. female presenting on 05/02/2019 for Urinary Tract Infection   Urinary Tract Infection  This is a new problem. The current episode started in the past 7 days. The problem occurs every urination. The problem has been gradually worsening. The quality of the pain is described as aching and burning. The pain is at a severity of 4/10. The pain is mild. There has been no fever. She is not sexually active. There is no history of pyelonephritis. Associated symptoms include frequency, hesitancy and urgency. Pertinent negatives include no chills, discharge, flank pain, hematuria, nausea, possible pregnancy, sweats or vomiting. She has tried  increased fluids for the symptoms. The treatment provided no relief.     Relevant past medical, surgical, family, and social history reviewed and updated as indicated.  Allergies and medications reviewed and updated.   Past Medical History:  Diagnosis Date  . Anxiety   . Breast cancer ([facility]) 05/2018   Right Breast Cancer  . Cancer ([facility]) 04/2018   right breast cancer  . Cholelithiasis   . Depression   . GERD (gastroesophageal reflux disease)   . History of kidney stones   . Hyperlipidemia   . Hypothyroidism   . Irritable bowel syndrome   . Personal history of radiation therapy 2020   Right Breast Cancer  . Pneumonia   . Thyroid disease   . Vitamin D deficiency     Past Surgical History:  Procedure Laterality Date  . ABDOMINAL HYSTERECTOMY  [id number]  . ANTERIOR CERVICAL DECOMP/DISCECTOMY FUSION N/A 01/21/2017   Procedure: C3-4, C4-5 ANTERIOR CERVICAL DISCECTOMY AND FUSION, ALLOGRAFT, PLATE;  Surgeon: [name], MD;  Location: [facility];  Service: Orthopedics;  Laterality: N/A;  . APPENDECTOMY  1996  . BREAST LUMPECTOMY Right 05/30/2018  . BREAST LUMPECTOMY WITH RADIOACTIVE SEED AND SENTINEL LYMPH NODE BIOPSY Right 05/30/2018   Procedure: RIGHT BREAST LUMPECTOMY WITH RADIOACTIVE SEED AND RIGHT AXILLARY SENTINEL LYMPH NODE BIOPSY;  Surgeon: Alphonsa Overall, MD;  Location: [facility];  Service: General;  Laterality: Right;  . BREAST MASS EXCISION  1997   left  . CHOLECYSTECTOMY  07/15/2011   Procedure: LAPAROSCOPIC CHOLECYSTECTOMY WITH INTRAOPERATIVE CHOLANGIOGRAM;  Surgeon: Shann Medal, MD;  Location: [facility];  Service: General;  Laterality:  N/A;  Cholecystectomy with IOC  . TOE FUSION    . TUBAL LIGATION  1980    Social History   Socioeconomic History  . Marital status: Divorced    Spouse name: Not on file  . Number of children: 1  . Years of education: 15  . Highest education level: High school graduate  Occupational History  . Occupation: Retired      Fish farm manager: [employer]  . Occupation: full time    Comment: sitter  Tobacco Use  . Smoking status: Current Some Day Smoker    Packs/day: 0.25    Years: 36.00    Pack years: 9.00    Types: Cigarettes    Start date: 09/30/1973    Last attempt to quit: 04/26/2018    Years since quitting: 1.0  . Smokeless tobacco: Never Used  . Tobacco comment: a pack per month  Substance and Sexual Activity  . Alcohol use: No    Alcohol/week: 0.0 standard drinks  . Drug use: No  . Sexual activity: Not on file  Other Topics Concern  . Not on file  Social History Narrative   3 grandchildren. Enjoys dancing and going out to eat.    Social Determinants of Health   Financial Resource Strain:   . Difficulty of Paying Living Expenses:   Food Insecurity:   . Worried About Charity fundraiser in the Last Year:   . Arboriculturist in the Last Year:   Transportation Needs:   . Film/video editor (Medical):   Marland Kitchen Lack of Transportation (Non-Medical):   Physical Activity:   . Days of Exercise per Week:   . Minutes of Exercise per Session:   Stress:   . Feeling of Stress :   Social Connections:   . Frequency of Communication with Friends and Family:   . Frequency of Social Gatherings with Friends and Family:   . Attends Religious Services:   . Active Member of Clubs or Organizations:   . Attends Archivist Meetings:   Marland Kitchen Marital Status:   Intimate Partner Violence:   . Fear of Current or Ex-Partner:   . Emotionally Abused:   Marland Kitchen Physically Abused:   . Sexually Abused:     Outpatient Encounter Medications as of 05/02/2019  Medication Sig  . ALPRAZolam (XANAX) 1 MG tablet Take 0.5-1 tablets (0.5-1 mg total) by mouth 3 (three) times daily as needed (FOR ANXIETY).  Marland Kitchen BIOTIN PO Take 2 tablets by mouth daily.  . cetirizine (ZYRTEC) 10 MG tablet Take 10 mg by mouth daily.  . Cholecalciferol (VITAMIN D3 PO) Take 4,000 Int'l Units by mouth daily.  . fluticasone (FLONASE) 50 MCG/ACT nasal spray  Place 2 sprays into both nostrils daily as needed (FOR ALLERGIES.).  Marland Kitchen gabapentin (NEURONTIN) 300 MG capsule TAKE 3 CAPSULES (900 MG TOTAL) BY MOUTH AT BEDTIME. (Patient taking differently: 300 mg. One cap in am, one at noon, and two at bedtime.)  . letrozole (FEMARA) 2.5 MG tablet Take 2.5 mg by mouth daily.  Marland Kitchen levothyroxine (SYNTHROID) 75 MCG tablet TAKE 1 TABLET BY MOUTH EVERY DAY  . pravastatin (PRAVACHOL) 40 MG tablet Take 1 tablet (40 mg total) by mouth daily.  . sertraline (ZOLOFT) 50 MG tablet Take 1 tablet (50 mg total) by mouth daily. (Needs to be seen before next refill)  . sulfamethoxazole-trimethoprim (BACTRIM DS) 800-160 MG tablet Take 1 tablet by mouth 2 (two) times daily for 7 days.  . [DISCONTINUED] sulfamethoxazole-trimethoprim (BACTRIM DS) 800-160 MG  tablet Take 1 tablet by mouth 2 (two) times daily for 7 days.   No facility-administered encounter medications on file as of 05/02/2019.    Allergies  Allergen Reactions  . Penicillins Rash and Other (See Comments)    PATIENT HAS HAD A PCN REACTION WITH IMMEDIATE RASH, FACIAL/TONGUE/THROAT SWELLING, SOB, OR LIGHTHEADEDNESS WITH HYPOTENSION:  #  #  #  YES  #  #  #   Has patient had a PCN reaction causing severe rash involving mucus membranes or skin necrosis: No Has patient had a PCN reaction that required hospitalization: No Has patient had a PCN reaction occurring within the last 10 years: No If all of the above answers are "NO", then may proceed with Cephalosporin use.   Marland Kitchen Doxycycline Rash    Review of Systems  Constitutional: Negative for activity change, appetite change, chills, diaphoresis, fatigue, fever and unexpected weight change.  HENT: Negative.   Eyes: Negative.  Negative for photophobia and visual disturbance.  Respiratory: Negative for cough, chest tightness and shortness of breath.   Cardiovascular: Negative for chest pain, palpitations and leg swelling.  Gastrointestinal: Negative for abdominal pain, blood  in stool, constipation, diarrhea, nausea and vomiting.  Endocrine: Negative.   Genitourinary: Positive for dysuria, frequency, hesitancy and urgency. Negative for decreased urine volume, difficulty urinating, dyspareunia, enuresis, flank pain, genital sores, hematuria, menstrual problem, pelvic pain, vaginal bleeding, vaginal discharge and vaginal pain.  Musculoskeletal: Negative for arthralgias and myalgias.  Skin: Negative.   Allergic/Immunologic: Negative.   Neurological: Negative for dizziness, weakness and headaches.  Hematological: Negative.   Psychiatric/Behavioral: Negative for confusion, hallucinations, sleep disturbance and suicidal ideas.  All other systems reviewed and are negative.        Observations/Objective: No vital signs or physical exam, this was a telephone or virtual health encounter.  Pt alert and oriented, answers all questions appropriately, and able to speak in full sentences.    Assessment and Plan: Jenia was seen today for urinary tract infection.  Diagnoses and all orders for this visit:  Frequency of urination Suspected UTI Reported symptoms consistent with acute cystitis. No red flags concerning for acute pyelonephritis. Will treat with below. Pt aware to report any new, worsening, or persistent symptoms.  -     sulfamethoxazole-trimethoprim (BACTRIM DS) 800-160 MG tablet; Take 1 tablet by mouth 2 (two) times daily for 7 days.     Follow Up Instructions: Return in about 2 weeks (around 05/16/2019), or if symptoms worsen or fail to improve, for urine recheck.    I discussed the assessment and treatment plan with the patient. The patient was provided an opportunity to ask questions and all were answered. The patient agreed with the plan and demonstrated an understanding of the instructions.   The patient was advised to call back or seek an in-person evaluation if the symptoms worsen or if the condition fails to improve as anticipated.  The above  assessment and management plan was discussed with the patient. The patient verbalized understanding of and has agreed to the management plan. Patient is aware to call the clinic if they develop any new symptoms or if symptoms persist or worsen. Patient is aware when to return to the clinic for a follow-up visit. Patient educated on when it is appropriate to go to the emergency department.    I provided 15 minutes of non-face-to-face time during this encounter. The call started at 1545. The call ended at 1600. The other time was used for coordination of care.  Monia Pouch, FNP-C [facility] [address] [place],  [address] [phone] 05/02/2019

## 2019-05-04 ENCOUNTER — Ambulatory Visit: Payer: Medicare HMO | Admitting: Family Medicine

## 2019-05-21 DIAGNOSIS — M7541 Impingement syndrome of right shoulder: Secondary | ICD-10-CM | POA: Diagnosis not present

## 2019-05-21 DIAGNOSIS — M50322 Other cervical disc degeneration at C5-C6 level: Secondary | ICD-10-CM | POA: Diagnosis not present

## 2019-05-21 DIAGNOSIS — M792 Neuralgia and neuritis, unspecified: Secondary | ICD-10-CM | POA: Diagnosis not present

## 2019-05-21 DIAGNOSIS — M50223 Other cervical disc displacement at C6-C7 level: Secondary | ICD-10-CM | POA: Diagnosis not present

## 2019-05-28 ENCOUNTER — Other Ambulatory Visit: Payer: Self-pay | Admitting: *Deleted

## 2019-05-28 DIAGNOSIS — F339 Major depressive disorder, recurrent, unspecified: Secondary | ICD-10-CM

## 2019-05-28 MED ORDER — SERTRALINE HCL 50 MG PO TABS: 50.0000 mg | ORAL_TABLET | Freq: Every day | ORAL | 0 refills | Status: DC

## 2019-05-29 ENCOUNTER — Other Ambulatory Visit: Payer: Self-pay | Admitting: *Deleted

## 2019-05-29 DIAGNOSIS — Z0000 Encounter for general adult medical examination without abnormal findings: Secondary | ICD-10-CM

## 2019-05-29 MED ORDER — PRAVASTATIN SODIUM 40 MG PO TABS: 40.0000 mg | ORAL_TABLET | Freq: Every day | ORAL | 0 refills | Status: DC

## 2019-06-11 DIAGNOSIS — Z87891 Personal history of nicotine dependence: Secondary | ICD-10-CM | POA: Diagnosis not present

## 2019-06-11 DIAGNOSIS — M792 Neuralgia and neuritis, unspecified: Secondary | ICD-10-CM | POA: Diagnosis not present

## 2019-06-11 DIAGNOSIS — C50511 Malignant neoplasm of lower-outer quadrant of right female breast: Secondary | ICD-10-CM | POA: Diagnosis not present

## 2019-06-11 DIAGNOSIS — Z170 Estrogen receptor positive status [ER+]: Secondary | ICD-10-CM | POA: Diagnosis not present

## 2019-06-11 DIAGNOSIS — M7541 Impingement syndrome of right shoulder: Secondary | ICD-10-CM | POA: Diagnosis not present

## 2019-06-11 DIAGNOSIS — E538 Deficiency of other specified B group vitamins: Secondary | ICD-10-CM | POA: Diagnosis not present

## 2019-06-11 DIAGNOSIS — Z79899 Other long term (current) drug therapy: Secondary | ICD-10-CM | POA: Diagnosis not present

## 2019-06-11 DIAGNOSIS — M8580 Other specified disorders of bone density and structure, unspecified site: Secondary | ICD-10-CM | POA: Diagnosis not present

## 2019-06-12 ENCOUNTER — Other Ambulatory Visit: Payer: Self-pay | Admitting: Family

## 2019-06-12 ENCOUNTER — Ambulatory Visit (INDEPENDENT_AMBULATORY_CARE_PROVIDER_SITE_OTHER): Payer: Medicare HMO

## 2019-06-12 ENCOUNTER — Telehealth: Payer: Self-pay | Admitting: Family

## 2019-06-12 ENCOUNTER — Other Ambulatory Visit: Payer: Self-pay

## 2019-06-12 DIAGNOSIS — E538 Deficiency of other specified B group vitamins: Secondary | ICD-10-CM

## 2019-06-12 MED ORDER — CYANOCOBALAMIN 1000 MCG/ML IJ SOLN
1000.0000 ug | Freq: Once | INTRAMUSCULAR | Status: AC
  Administered 2019-06-12: 1000 ug via INTRAMUSCULAR

## 2019-06-12 NOTE — Telephone Encounter (Signed)
Dr Tressie Stalker called and spoke with Alyse Low earlier this morning regarding pt and stated that her B12 was very low. Requested that pt be scheduled to come in and have B12 shots.  Pt called to see if we had scheduled her yet for B12 shot. Confirmed with Alyse Low that she needed to be scheduled for this. Scheduled pt to come in today at 10:30 AM to have B12 Shot.

## 2019-06-14 ENCOUNTER — Ambulatory Visit (INDEPENDENT_AMBULATORY_CARE_PROVIDER_SITE_OTHER): Payer: Medicare HMO | Admitting: Orthopaedic Surgery

## 2019-06-14 ENCOUNTER — Encounter: Payer: Self-pay | Admitting: Orthopaedic Surgery

## 2019-06-14 ENCOUNTER — Other Ambulatory Visit: Payer: Self-pay

## 2019-06-14 ENCOUNTER — Ambulatory Visit (INDEPENDENT_AMBULATORY_CARE_PROVIDER_SITE_OTHER): Payer: Medicare HMO

## 2019-06-14 VITALS — Ht 63.0 in | Wt 143.0 lb

## 2019-06-14 DIAGNOSIS — M542 Cervicalgia: Secondary | ICD-10-CM | POA: Diagnosis not present

## 2019-06-14 DIAGNOSIS — M25511 Pain in right shoulder: Secondary | ICD-10-CM

## 2019-06-14 DIAGNOSIS — H2513 Age-related nuclear cataract, bilateral: Secondary | ICD-10-CM | POA: Diagnosis not present

## 2019-06-14 DIAGNOSIS — G8929 Other chronic pain: Secondary | ICD-10-CM | POA: Diagnosis not present

## 2019-06-14 DIAGNOSIS — Z0100 Encounter for examination of eyes and vision without abnormal findings: Secondary | ICD-10-CM | POA: Diagnosis not present

## 2019-06-14 NOTE — Progress Notes (Signed)
Office Visit Note   Patient: Jaclyn Schmidt           Date of Birth: 1950-09-29           MRN: [id number] Visit Date: 06/14/2019              Requested by: Sharion Balloon, [place] [place],  [place] [id number] PCP: Sharion Balloon, FNP   Assessment & Plan: Visit Diagnoses:  1. Neck pain   2. Chronic right shoulder pain     Plan: Patient's previous cervical fusion is solid.  She does have some mild spurring at C5-6 C6-7 below her fusion.previous MRI 2018 showed small central disc protrusion at C5-6.  She may have partial rotator cuff tear she has failed anti-inflammatories therapy and previous injections.  I would recommend proceeding with right shoulder MRI scan to rule out small rotator cuff tear not responsive to conservative treatment office follow-up after scan for review.  Follow-Up Instructions: Return after right shoulder MRI scan.  Orders:  Orders Placed This Encounter  Procedures  . XR Cervical Spine 2 or 3 views  . MR SHOULDER RIGHT WO CONTRAST   No orders of the defined types were placed in this encounter.     Procedures: No procedures performed   Clinical Data: No additional findings.   Subjective: Chief Complaint  Patient presents with  . Neck - Pain    HPI 69 year old female with neck pain right shoulder pain.  She has had treatment with injections most recently 06/11/2019 with continued problems.  She did note some improvement with the injection.  Past history of right breast cancer.  Recently started on some B12 shots for low B12 level.  Her pain is been in her shoulder she has had pain with outstretch reaching overhead activities and some discomfort with neck rotation.  No gait disturbance no myelopathic problems no balance or falls issues.  Patient's had previous radiation treatment for breast cancer.  History of GERD depression, anxiety.  Cervical fusion was 01/21/2017.  Patient felt injection helped her shoulder.  She also been on  tamoxifen.  She denied any injury to the shoulder.  Review of Systems all other systems are negative as it pertains HPI.   Objective: Vital Signs: Ht 5\' 3"  (1.6 m)   Wt 143 lb (64.9 kg)   BMI 25.33 kg/m   Physical Exam Constitutional:      Appearance: She is well-developed.  HENT:     Head: Normocephalic.     Right Ear: External ear normal.     Left Ear: External ear normal.  Eyes:     Pupils: Pupils are equal, round, and reactive to light.  Neck:     Thyroid: No thyromegaly.     Trachea: No tracheal deviation.  Cardiovascular:     Rate and Rhythm: Normal rate.  Pulmonary:     Effort: Pulmonary effort is normal.  Abdominal:     Palpations: Abdomen is soft.  Skin:    General: Skin is warm and dry.  Neurological:     Mental Status: She is alert and oriented to person, place, and time.  Psychiatric:        Behavior: Behavior normal.     Ortho Exam patient has intact upper extremity reflexes. Well-healed neck incision negative Spurling.  No isolated motor weakness upper extremity.  Mildly positive impingement on the right side negative on the left long of the biceps is normal.  No instability of the right or  left shoulder. Specialty Comments:  No specialty comments available.  Imaging: AP lateral cervical spine x-rays are obtained and reviewed.  This shows two-level cervical fusion with anterior plate at 075-GRM, [id number] with good incorporation.  There is some anterior spurring at see 6-7.  There is 1 mm retrolisthesis of C5-6.  Impression: Fusion C3-C5.  Endplate spurring noted at C6-7.   PMFS History: Patient Active Problem List   Diagnosis Date Noted  . Malignant neoplasm of lower-inner quadrant of right breast of female, estrogen receptor positive ([place]) 04/26/2018  . Upper respiratory tract infection 12/05/2017  . Vertigo 09/05/2017  . Hypothyroidism 07/18/2017  . Osteopenia 03/09/2017  . Anal sphincter incompetence 12/29/2016  . Depression, recurrent ([facility])  12/29/2016  . Chronic midline thoracic back pain 09/21/2016  . Lumbar pain 09/21/2016  . Gall bladder disease 06/10/2011  . Cholelithiasis 06/03/2011   Past Medical History:  Diagnosis Date  . Anxiety   . Breast cancer ([facility]) 05/2018   Right Breast Cancer  . Cancer ([facility]) 04/2018   right breast cancer  . Cholelithiasis   . Depression   . GERD (gastroesophageal reflux disease)   . History of kidney stones   . Hyperlipidemia   . Hypothyroidism   . Irritable bowel syndrome   . Personal history of radiation therapy 2020   Right Breast Cancer  . Pneumonia   . Thyroid disease   . Vitamin D deficiency     Family History  Problem Relation Age of Onset  . Cancer Mother        stomach  . Heart disease Father   . Cancer Maternal Aunt        ovarian  . Cancer Paternal Aunt        breast  . Diabetes Brother   . Hypertension Brother     Past Surgical History:  Procedure Laterality Date  . ABDOMINAL HYSTERECTOMY  [id number]  . ANTERIOR CERVICAL DECOMP/DISCECTOMY FUSION N/A 01/21/2017   Procedure: C3-4, C4-5 ANTERIOR CERVICAL DISCECTOMY AND FUSION, ALLOGRAFT, PLATE;  Surgeon: [name], MD;  Location: [facility];  Service: Orthopedics;  Laterality: N/A;  . APPENDECTOMY  1996  . BREAST LUMPECTOMY Right 05/30/2018  . BREAST LUMPECTOMY WITH RADIOACTIVE SEED AND SENTINEL LYMPH NODE BIOPSY Right 05/30/2018   Procedure: RIGHT BREAST LUMPECTOMY WITH RADIOACTIVE SEED AND RIGHT AXILLARY SENTINEL LYMPH NODE BIOPSY;  Surgeon: Alphonsa Overall, MD;  Location: [facility];  Service: General;  Laterality: Right;  . BREAST MASS EXCISION  1997   left  . CHOLECYSTECTOMY  07/15/2011   Procedure: LAPAROSCOPIC CHOLECYSTECTOMY WITH INTRAOPERATIVE CHOLANGIOGRAM;  Surgeon: Shann Medal, MD;  Location: [facility];  Service: General;  Laterality: N/A;  Cholecystectomy with IOC  . TOE FUSION    . TUBAL LIGATION  1980   Social History   Occupational History  . Occupation: Retired     Fish farm manager: [employer]  . Occupation: full time    Comment: sitter  Tobacco Use  . Smoking status: Current Some Day Smoker    Packs/day: 0.25    Years: 36.00    Pack years: 9.00    Types: Cigarettes    Start date: 09/30/1973    Last attempt to quit: 04/26/2018    Years since quitting: 1.1  . Smokeless tobacco: Never Used  . Tobacco comment: a pack per month  Substance and Sexual Activity  . Alcohol use: No    Alcohol/week: 0.0 standard drinks  . Drug use: No  . Sexual activity: Not on file

## 2019-06-19 ENCOUNTER — Ambulatory Visit (INDEPENDENT_AMBULATORY_CARE_PROVIDER_SITE_OTHER): Payer: Medicare HMO | Admitting: *Deleted

## 2019-06-19 ENCOUNTER — Other Ambulatory Visit: Payer: Self-pay

## 2019-06-19 DIAGNOSIS — E538 Deficiency of other specified B group vitamins: Secondary | ICD-10-CM | POA: Diagnosis not present

## 2019-06-19 MED ORDER — CYANOCOBALAMIN 1000 MCG/ML IJ SOLN
1000.0000 ug | Freq: Once | INTRAMUSCULAR | Status: AC
  Administered 2019-06-19: 1000 ug via INTRAMUSCULAR

## 2019-06-19 NOTE — Progress Notes (Signed)
Patient in today for B12 injection. 1000 mcg given IM in left deltoid. Patient tolerated well.  

## 2019-06-26 ENCOUNTER — Other Ambulatory Visit: Payer: Self-pay

## 2019-06-26 ENCOUNTER — Ambulatory Visit (INDEPENDENT_AMBULATORY_CARE_PROVIDER_SITE_OTHER): Payer: Medicare HMO | Admitting: Family

## 2019-06-26 ENCOUNTER — Other Ambulatory Visit: Payer: Medicare HMO

## 2019-06-26 ENCOUNTER — Ambulatory Visit: Payer: Medicare HMO | Admitting: Physician Assistant

## 2019-06-26 ENCOUNTER — Encounter: Payer: Self-pay | Admitting: Family

## 2019-06-26 VITALS — BP 120/74 | HR 96 | Temp 97.7°F | Ht 63.0 in | Wt 143.8 lb

## 2019-06-26 DIAGNOSIS — E785 Hyperlipidemia, unspecified: Secondary | ICD-10-CM | POA: Diagnosis not present

## 2019-06-26 DIAGNOSIS — Z23 Encounter for immunization: Secondary | ICD-10-CM

## 2019-06-26 DIAGNOSIS — E538 Deficiency of other specified B group vitamins: Secondary | ICD-10-CM | POA: Diagnosis not present

## 2019-06-26 DIAGNOSIS — K6289 Other specified diseases of anus and rectum: Secondary | ICD-10-CM | POA: Diagnosis not present

## 2019-06-26 DIAGNOSIS — Z1159 Encounter for screening for other viral diseases: Secondary | ICD-10-CM

## 2019-06-26 DIAGNOSIS — F411 Generalized anxiety disorder: Secondary | ICD-10-CM

## 2019-06-26 DIAGNOSIS — E039 Hypothyroidism, unspecified: Secondary | ICD-10-CM | POA: Diagnosis not present

## 2019-06-26 DIAGNOSIS — F17200 Nicotine dependence, unspecified, uncomplicated: Secondary | ICD-10-CM | POA: Diagnosis not present

## 2019-06-26 DIAGNOSIS — Z9189 Other specified personal risk factors, not elsewhere classified: Secondary | ICD-10-CM

## 2019-06-26 DIAGNOSIS — R69 Illness, unspecified: Secondary | ICD-10-CM | POA: Diagnosis not present

## 2019-06-26 DIAGNOSIS — Z79899 Other long term (current) drug therapy: Secondary | ICD-10-CM

## 2019-06-26 DIAGNOSIS — R42 Dizziness and giddiness: Secondary | ICD-10-CM

## 2019-06-26 DIAGNOSIS — Z170 Estrogen receptor positive status [ER+]: Secondary | ICD-10-CM

## 2019-06-26 DIAGNOSIS — Z0000 Encounter for general adult medical examination without abnormal findings: Secondary | ICD-10-CM

## 2019-06-26 DIAGNOSIS — F339 Major depressive disorder, recurrent, unspecified: Secondary | ICD-10-CM

## 2019-06-26 DIAGNOSIS — C50311 Malignant neoplasm of lower-inner quadrant of right female breast: Secondary | ICD-10-CM | POA: Diagnosis not present

## 2019-06-26 DIAGNOSIS — G8929 Other chronic pain: Secondary | ICD-10-CM

## 2019-06-26 DIAGNOSIS — M546 Pain in thoracic spine: Secondary | ICD-10-CM

## 2019-06-26 MED ORDER — CYANOCOBALAMIN 1000 MCG/ML IJ SOLN
1000.0000 ug | Freq: Once | INTRAMUSCULAR | Status: AC
  Administered 2019-06-26: 1000 ug via INTRAMUSCULAR

## 2019-06-26 MED ORDER — MECLIZINE HCL 25 MG PO TABS: 25.0000 mg | ORAL_TABLET | Freq: Three times a day (TID) | ORAL | 3 refills | Status: DC | PRN

## 2019-06-26 MED ORDER — ALPRAZOLAM 0.5 MG PO TABS: 0.5000 mg | ORAL_TABLET | Freq: Every evening | ORAL | 1 refills | Status: DC | PRN

## 2019-06-26 NOTE — Patient Instructions (Signed)
Health Maintenance After Age 69 After age 69, you are at a higher risk for certain long-term diseases and infections as well as injuries from falls. Falls are a major cause of broken bones and head injuries in people who are older than age 69. Getting regular preventive care can help to keep you healthy and well. Preventive care includes getting regular testing and making lifestyle changes as recommended by your health care provider. Talk with your health care provider about:  Which screenings and tests you should have. A screening is a test that checks for a disease when you have no symptoms.  A diet and exercise plan that is right for you. What should I know about screenings and tests to prevent falls? Screening and testing are the best ways to find a health problem early. Early diagnosis and treatment give you the best chance of managing medical conditions that are common after age 69. Certain conditions and lifestyle choices may make you more likely to have a fall. Your health care provider may recommend:  Regular vision checks. Poor vision and conditions such as cataracts can make you more likely to have a fall. If you wear glasses, make sure to get your prescription updated if your vision changes.  Medicine review. Work with your health care provider to regularly review all of the medicines you are taking, including over-the-counter medicines. Ask your health care provider about any side effects that may make you more likely to have a fall. Tell your health care provider if any medicines that you take make you feel dizzy or sleepy.  Osteoporosis screening. Osteoporosis is a condition that causes the bones to get weaker. This can make the bones weak and cause them to break more easily.  Blood pressure screening. Blood pressure changes and medicines to control blood pressure can make you feel dizzy.  Strength and balance checks. Your health care provider may recommend certain tests to check your  strength and balance while standing, walking, or changing positions.  Foot health exam. Foot pain and numbness, as well as not wearing proper footwear, can make you more likely to have a fall.  Depression screening. You may be more likely to have a fall if you have a fear of falling, feel emotionally low, or feel unable to do activities that you used to do.  Alcohol use screening. Using too much alcohol can affect your balance and may make you more likely to have a fall. What actions can I take to lower my risk of falls? General instructions  Talk with your health care provider about your risks for falling. Tell your health care provider if: ? You fall. Be sure to tell your health care provider about all falls, even ones that seem minor. ? You feel dizzy, sleepy, or off-balance.  Take over-the-counter and prescription medicines only as told by your health care provider. These include any supplements.  Eat a healthy diet and maintain a healthy weight. A healthy diet includes low-fat dairy products, low-fat (lean) meats, and fiber from whole grains, beans, and lots of fruits and vegetables. Home safety  Remove any tripping hazards, such as rugs, cords, and clutter.  Install safety equipment such as grab bars in bathrooms and safety rails on stairs.  Keep rooms and walkways well-lit. Activity   Follow a regular exercise program to stay fit. This will help you maintain your balance. Ask your health care provider what types of exercise are appropriate for you.  If you need a cane or   walker, use it as recommended by your health care provider.  Wear supportive shoes that have nonskid soles. Lifestyle  Do not drink alcohol if your health care provider tells you not to drink.  If you drink alcohol, limit how much you have: ? 0-1 drink a day for women. ? 0-2 drinks a day for men.  Be aware of how much alcohol is in your drink. In the [place] one drink equals one typical bottle of beer (12  oz), one-half glass of wine (5 oz), or one shot of hard liquor (1 oz).  Do not use any products that contain nicotine or tobacco, such as cigarettes and e-cigarettes. If you need help quitting, ask your health care provider. Summary  Having a healthy lifestyle and getting preventive care can help to protect your health and wellness after age 69.  Screening and testing are the best way to find a health problem early and help you avoid having a fall. Early diagnosis and treatment give you the best chance for managing medical conditions that are more common for people who are older than age 69.  Falls are a major cause of broken bones and head injuries in people who are older than age 69. Take precautions to prevent a fall at home.  Work with your health care provider to learn what changes you can make to improve your health and wellness and to prevent falls. This information is not intended to replace advice given to you by your health care provider. Make sure you discuss any questions you have with your health care provider. Document Revised: 04/27/2018 Document Reviewed: 11/17/2016 Elsevier Patient Education  2020 Elsevier Inc.  

## 2019-06-26 NOTE — Progress Notes (Signed)
Subjective:    Patient ID: Jaclyn Schmidt, female    DOB: [birth year], 69 y.o.   MRN: [id number]  Chief Complaint  Patient presents with  . Medical Management of Chronic Issues   PT presents to the office today for chronic follow up. She is followed by Oncologists every 3-4 months for Right Breast cancer. She completed radiation 2020. States she is doing well at this time. She has anal sphincter incompetence and taking  Metamucil. She does pelvic floor exercises daily.   She also has Vit B 12 deficiency and gets monthly injections.  Depression        This is a chronic problem.  The current episode started more than 1 year ago.   The problem occurs intermittently.  Associated symptoms include helplessness, hopelessness, irritable, restlessness, decreased interest and sad.  Associated symptoms include no fatigue.  Past treatments include SSRIs - Selective serotonin reuptake inhibitors.  Compliance with treatment is good.  Past medical history includes thyroid problem and anxiety.   Nicotine Dependence Presents for follow-up visit. Symptoms include irritability. Symptoms are negative for fatigue. Her urge triggers include company of smokers. The symptoms have been stable. She smokes < 1/2 a pack of cigarettes per day.  Thyroid Problem Presents for follow-up visit. Symptoms include anxiety and diarrhea. Patient reports no constipation or fatigue. The symptoms have been stable. Her past medical history is significant for hyperlipidemia.  Back Pain This is a chronic problem. The current episode started more than 1 year ago.  Hyperlipidemia This is a chronic problem. The current episode started more than 1 year ago. Exacerbating diseases include obesity. Current antihyperlipidemic treatment includes statins. The current treatment provides moderate improvement of lipids. Risk factors for coronary artery disease include dyslipidemia, hypertension and a sedentary lifestyle.  Anxiety Presents for  follow-up visit. Symptoms include dizziness, excessive worry, irritability, nervous/anxious behavior and restlessness. Symptoms occur most days.    Dizziness This is a recurrent problem. The problem occurs intermittently. Pertinent negatives include no fatigue.      Review of Systems  Constitutional: Positive for irritability. Negative for fatigue.  Gastrointestinal: Positive for diarrhea. Negative for constipation.  Musculoskeletal: Positive for back pain.  Neurological: Positive for dizziness.  Psychiatric/Behavioral: Positive for depression. The patient is nervous/anxious.   All other systems reviewed and are negative.      Objective:   Physical Exam Vitals reviewed.  Constitutional:      General: She is irritable. She is not in acute distress.    Appearance: She is well-developed.  HENT:     Head: Normocephalic and atraumatic.     Right Ear: Tympanic membrane normal.     Left Ear: Tympanic membrane normal.  Eyes:     Pupils: Pupils are equal, round, and reactive to light.  Neck:     Thyroid: No thyromegaly.  Cardiovascular:     Rate and Rhythm: Normal rate and regular rhythm.     Heart sounds: Normal heart sounds. No murmur.  Pulmonary:     Effort: Pulmonary effort is normal. No respiratory distress.     Breath sounds: Normal breath sounds. No wheezing.  Abdominal:     General: Bowel sounds are normal. There is no distension.     Palpations: Abdomen is soft.     Tenderness: There is no abdominal tenderness.  Musculoskeletal:        General: No tenderness. Normal range of motion.     Cervical back: Normal range of motion and neck supple.  Skin:  General: Skin is warm and dry.  Neurological:     Mental Status: She is alert and oriented to person, place, and time.     Cranial Nerves: No cranial nerve deficit.     Deep Tendon Reflexes: Reflexes are normal and symmetric.  Psychiatric:        Behavior: Behavior normal.        Thought Content: Thought content  normal.        Judgment: Judgment normal.     BP 120/74   Pulse 96   Temp 97.7 F (36.5 C) (Temporal)   Ht 5' 3" (1.6 m)   Wt 143 lb 12.8 oz (65.2 kg)   SpO2 94%   BMI 25.47 kg/m        Assessment & Plan:  KESLIE GRITZ comes in today with chief complaint of Medical Management of Chronic Issues   Diagnosis and orders addressed:  1. Hypothyroidism, unspecified type - CMP14+EGFR - CBC with Differential/Platelet - TSH  2. Depression, recurrent ([facility]) - CMP14+EGFR - CBC with Differential/Platelet  3. Malignant neoplasm of lower-inner quadrant of right breast of female, estrogen receptor positive ([place]) - CMP14+EGFR - CBC with Differential/Platelet  4. Current smoker - CMP14+EGFR - CBC with Differential/Platelet  5. Hyperlipidemia, unspecified hyperlipidemia type - CMP14+EGFR - CBC with Differential/Platelet - Lipid panel  6. Well adult exam   7. Controlled substance agreement signed - ALPRAZolam (XANAX) 0.5 MG tablet; Take 1 tablet (0.5 mg total) by mouth at bedtime as needed for anxiety.  Dispense: 20 tablet; Refill: 1 - CMP14+EGFR - CBC with Differential/Platelet - ToxASSURE Select 13 (MW), Urine  8. Need for hepatitis C screening test - [facility]CRE[facility] - CMP14+EGFR - CBC with Differential/Platelet  9. Pneumococcal vaccination indicated  - Pneumococcal polysaccharide vaccine 23-valent greater than or equal to 2yo subcutaneous/IM - CMP14+EGFR - CBC with Differential/Platelet  10. GAD (generalized anxiety disorder) - ALPRAZolam (XANAX) 0.5 MG tablet; Take 1 tablet (0.5 mg total) by mouth at bedtime as needed for anxiety.  Dispense: 20 tablet; Refill: 1 - CMP14+EGFR - CBC with Differential/Platelet  11. Anal sphincter incompetence   12. Chronic midline thoracic back pain  13. Vertigo    Labs pending Patient reviewed in [place] controlled database, no flags noted. Contract and drug screen are up dated today.  Health Maintenance  reviewed Diet and exercise encouraged  Follow up plan: 6 months    Evelina Dun, FNP

## 2019-06-27 LAB — LIPID PANEL
Chol/HDL Ratio: 3.9 ratio (ref 0.0–4.4)
Cholesterol, Total: 217 mg/dL — ABNORMAL HIGH (ref 100–199)
HDL: 56 mg/dL (ref >39)
LDL Chol Calc (NIH): 129 mg/dL — ABNORMAL HIGH (ref 0–99)
Triglycerides: 181 mg/dL — ABNORMAL HIGH (ref 0–149)
VLDL Cholesterol Cal: 32 mg/dL (ref 5–40)

## 2019-06-27 LAB — CBC WITH DIFFERENTIAL/PLATELET
Basophils Absolute: 0 10*3/uL (ref 0.0–0.2)
Basos: 0 %
EOS (ABSOLUTE): 0.1 10*3/uL (ref 0.0–0.4)
Eos: 1 %
Hematocrit: 41.7 % (ref 34.0–46.6)
Hemoglobin: 14 g/dL (ref 11.1–15.9)
Immature Grans (Abs): 0.1 10*3/uL (ref 0.0–0.1)
Immature Granulocytes: 1 %
Lymphocytes Absolute: 1.9 10*3/uL (ref 0.7–3.1)
Lymphs: 25 %
MCH: 30.1 pg (ref 26.6–33.0)
MCHC: 33.6 g/dL (ref 31.5–35.7)
MCV: 90 fL (ref 79–97)
Monocytes Absolute: 0.8 10*3/uL (ref 0.1–0.9)
Monocytes: 10 %
Neutrophils Absolute: 4.8 10*3/uL (ref 1.4–7.0)
Neutrophils: 63 %
Platelets: 261 10*3/uL (ref 150–450)
RBC: 4.65 x10E6/uL (ref 3.77–5.28)
RDW: 13 % (ref 11.7–15.4)
WBC: 7.5 10*3/uL (ref 3.4–10.8)

## 2019-06-27 LAB — CMP14+EGFR
ALT: 16 IU/L (ref 0–32)
AST: 20 IU/L (ref 0–40)
Albumin/Globulin Ratio: 2 (ref 1.2–2.2)
Albumin: 4.3 g/dL (ref 3.8–4.8)
Alkaline Phosphatase: 78 IU/L (ref 48–121)
BUN/Creatinine Ratio: 25 (ref 12–28)
BUN: 17 mg/dL (ref 8–27)
Bilirubin Total: 0.4 mg/dL (ref 0.0–1.2)
CO2: 22 mmol/L (ref 20–29)
Calcium: 10.1 mg/dL (ref 8.7–10.3)
Chloride: 104 mmol/L (ref 96–106)
Creatinine, Ser: 0.67 mg/dL (ref 0.57–1.00)
GFR calc Af Amer: 104 mL/min/{1.73_m2} (ref >59)
GFR calc non Af Amer: 91 mL/min/{1.73_m2} (ref >59)
Globulin, Total: 2.1 g/dL (ref 1.5–4.5)
Glucose: 81 mg/dL (ref 65–99)
Potassium: 4.4 mmol/L (ref 3.5–5.2)
Sodium: 140 mmol/L (ref 134–144)
Total Protein: 6.4 g/dL (ref 6.0–8.5)

## 2019-06-27 LAB — TSH: TSH: 4.37 u[IU]/mL (ref 0.450–4.500)

## 2019-06-29 ENCOUNTER — Telehealth: Payer: Self-pay | Admitting: Family

## 2019-06-29 ENCOUNTER — Other Ambulatory Visit: Payer: Self-pay | Admitting: Family

## 2019-06-29 LAB — TOXASSURE SELECT 13 (MW), URINE

## 2019-06-29 NOTE — Telephone Encounter (Signed)
Patient aware of labs. Verbalized understanding 

## 2019-07-03 ENCOUNTER — Other Ambulatory Visit: Payer: Self-pay

## 2019-07-03 ENCOUNTER — Ambulatory Visit (INDEPENDENT_AMBULATORY_CARE_PROVIDER_SITE_OTHER): Payer: Medicare HMO | Admitting: *Deleted

## 2019-07-03 DIAGNOSIS — E538 Deficiency of other specified B group vitamins: Secondary | ICD-10-CM | POA: Diagnosis not present

## 2019-07-03 MED ORDER — CYANOCOBALAMIN 1000 MCG/ML IJ SOLN
1000.0000 ug | INTRAMUSCULAR | Status: AC
  Administered 2019-07-03 – 2020-06-12 (×12): 1000 ug via INTRAMUSCULAR

## 2019-07-03 NOTE — Patient Instructions (Signed)

## 2019-07-03 NOTE — Progress Notes (Signed)
Pt given B12 injection IM left deltoid and tolerated well. °

## 2019-07-10 ENCOUNTER — Ambulatory Visit (HOSPITAL_COMMUNITY): Payer: Medicare HMO

## 2019-07-31 ENCOUNTER — Ambulatory Visit (HOSPITAL_COMMUNITY)
Admission: RE | Admit: 2019-07-31 | Discharge: 2019-07-31 | Disposition: A | Discharge status: Home / Self Care | Payer: Medicare HMO | Source: Ambulatory Visit | Attending: Orthopaedic Surgery | Admitting: Orthopaedic Surgery

## 2019-07-31 ENCOUNTER — Other Ambulatory Visit: Payer: Self-pay

## 2019-07-31 DIAGNOSIS — M25511 Pain in right shoulder: Secondary | ICD-10-CM | POA: Diagnosis not present

## 2019-07-31 DIAGNOSIS — G8929 Other chronic pain: Secondary | ICD-10-CM | POA: Diagnosis not present

## 2019-07-31 DIAGNOSIS — R69 Illness, unspecified: Secondary | ICD-10-CM | POA: Diagnosis not present

## 2019-08-03 ENCOUNTER — Ambulatory Visit (INDEPENDENT_AMBULATORY_CARE_PROVIDER_SITE_OTHER): Payer: Medicare HMO

## 2019-08-03 ENCOUNTER — Other Ambulatory Visit: Payer: Self-pay

## 2019-08-03 DIAGNOSIS — E538 Deficiency of other specified B group vitamins: Secondary | ICD-10-CM | POA: Diagnosis not present

## 2019-08-03 NOTE — Progress Notes (Signed)
Cyanocobalamin injection given to left deltoid.  Patient tolerated well. 

## 2019-08-09 ENCOUNTER — Encounter: Payer: Self-pay | Admitting: Orthopaedic Surgery

## 2019-08-09 ENCOUNTER — Ambulatory Visit (INDEPENDENT_AMBULATORY_CARE_PROVIDER_SITE_OTHER): Payer: Medicare HMO | Admitting: Orthopaedic Surgery

## 2019-08-09 ENCOUNTER — Other Ambulatory Visit: Payer: Self-pay

## 2019-08-09 DIAGNOSIS — M7541 Impingement syndrome of right shoulder: Secondary | ICD-10-CM

## 2019-08-13 ENCOUNTER — Telehealth: Payer: Self-pay | Admitting: Family

## 2019-08-13 DIAGNOSIS — M7541 Impingement syndrome of right shoulder: Secondary | ICD-10-CM | POA: Diagnosis not present

## 2019-08-13 MED ORDER — BUPIVACAINE HCL 0.25 % IJ SOLN
4.0000 mL | INTRAMUSCULAR | Status: AC | PRN
  Administered 2019-08-13: 4 mL via INTRA_ARTICULAR

## 2019-08-13 MED ORDER — LIDOCAINE HCL 1 % IJ SOLN
0.5000 mL | INTRAMUSCULAR | Status: AC | PRN
  Administered 2019-08-13: .5 mL

## 2019-08-13 MED ORDER — METHYLPREDNISOLONE ACETATE 40 MG/ML IJ SUSP
40.0000 mg | INTRAMUSCULAR | Status: AC | PRN
  Administered 2019-08-13: 40 mg via INTRA_ARTICULAR

## 2019-08-13 NOTE — Progress Notes (Signed)
Office Visit Note   Patient: Jaclyn Schmidt           Date of Birth: October 10, 1950           MRN: [id number] Visit Date: 08/09/2019              Requested by: Sharion Balloon, [address] [place],  [place] [address] PCP: Sharion Balloon, FNP   Assessment & Plan: Visit Diagnoses:  1. Impingement syndrome of right shoulder     Plan: Patient has significant tendinosis of the supraspinatus tendon.  We discussed that this point surgery is not required.  Repeat subacromial injection performed which gave her some improvement in her symptoms.  We discussed activity modification if she has persistent symptoms then surgery can be discussed.  We reviewed the images and she understands that if she can rested and avoid outstretched reaching pulling she should get significant improvement in her symptoms.  Follow-Up Instructions: No follow-ups on file.   Orders:  Orders Placed This Encounter  Procedures  . Large Joint Inj   No orders of the defined types were placed in this encounter.     Procedures: Large Joint Inj: R subacromial bursa on 08/13/2019 3:38 PM Indications: pain Details: 22 G 1.5 in needle  Arthrogram: No  Medications: 4 mL bupivacaine 0.25 %; 40 mg methylPREDNISolone acetate 40 MG/ML; 0.5 mL lidocaine 1 % Outcome: tolerated well, no immediate complications Procedure, treatment alternatives, risks and benefits explained, specific risks discussed. Consent was given by the patient. Immediately prior to procedure a time out was called to verify the correct patient, procedure, equipment, support staff and site/side marked as required. Patient was prepped and draped in the usual sterile fashion.       Clinical Data: No additional findings.   Subjective: Chief Complaint  Patient presents with  . Right Shoulder - Pain, Follow-up    MRI Right shoulder review    HPI patient returns with ongoing problems with pain in her right shoulder.  MRI scan was done 1 week  ago 08/01/2019 and is available for review.  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  MRI scan shows moderate rotator cuff tendinosis involving the supraspinatus tendon with interstitial tears but no partial full-thickness tear.  Biceps tendon was intact type II acromium some early glenohumeral degenerative changes.  Patient does well as long she does have to do lifting or outstretched reaching.  She states she has a hard time refusing some the client request to have her help pull them up.  Previous two-level cervical fusion C3-C5 anteriorly 2019 doing well.  No myelopathic symptoms. Review of Systems updated unchanged from previous office note.  Of note is positive breast cancer right.   Objective: Vital Signs: Ht 5\' 3"  (1.6 m)   Wt 142 lb (64.4 kg)   BMI 25.15 kg/m   Physical Exam Constitutional:      Appearance: She is well-developed.  HENT:     Head: Normocephalic.     Right Ear: External ear normal.     Left Ear: External ear normal.  Eyes:     Pupils: Pupils are equal, round, and reactive to light.  Neck:     Thyroid: No thyromegaly.     Trachea: No tracheal deviation.  Cardiovascular:     Rate and Rhythm: Normal rate.  Pulmonary:     Effort: Pulmonary effort is normal.  Abdominal:  Palpations: Abdomen is soft.  Skin:    General: Skin is warm and dry.  Neurological:     Mental Status: She is alert and oriented to person, place, and time.  Psychiatric:        Behavior: Behavior normal.     Ortho Exam Patient has negative brachial plexus tenderness negative Spurling.  Pain with positive impingement right negative on the left long head of the biceps is normal.  Negative drop arm test.  No subscap weakness negative left liftoff test.    Specialty Comments:  No specialty comments available.  Imaging: Narrative & Impression  CLINICAL DATA:  Right shoulder pain for 2 months.  EXAM: MRI OF THE RIGHT  SHOULDER WITHOUT CONTRAST  TECHNIQUE: Multiplanar, multisequence MR imaging of the shoulder was performed. No intravenous contrast was administered.  COMPARISON:  None.  FINDINGS: Rotator cuff: Moderate rotator cuff tendinopathy/tendinosis most notably involving the supraspinatus tendon. Interstitial tears but no partial or full-thickness tear.  Muscles:  Unremarkable.  Biceps long head: Intact. Mild tendinopathy involving the intra-articular portion.  Acromioclavicular Joint: No degenerative changes. Type 2 acromion. Mild lateral downsloping but no undersurface spurring.  Glenohumeral Joint: Mild/early degenerative changes with mild degenerative chondrosis. No joint effusion or synovitis.  Labrum: Mild labral degenerative changes but no discrete labral tear.  Bones:  No significant bony findings.  Other: No subacromial/subdeltoid fluid collections to suggest bursitis.  IMPRESSION: 1. Moderate rotator cuff tendinopathy/tendinosis most notably involving the supraspinatus tendon. Interstitial tears but no partial or full-thickness tear. 2. Intact long head biceps tendon and glenoid labrum. 3. Mild lateral downsloping of a type 2 acromion but no undersurface spurring. 4. Mild/early glenohumeral joint degenerative changes.   Electronically Signed   By: Marijo Sanes M.D.   On: 08/01/2019 09:37      PMFS History: Patient Active Problem List   Diagnosis Date Noted  . Impingement syndrome of right shoulder 08/13/2019  . Malignant neoplasm of lower-inner quadrant of right breast of female, estrogen receptor positive ([facility]) 04/26/2018  . Vertigo 09/05/2017  . Hypothyroidism 07/18/2017  . Osteopenia 03/09/2017  . Anal sphincter incompetence 12/29/2016  . Depression, recurrent ([facility]) 12/29/2016  . Chronic midline thoracic back pain 09/21/2016  . Lumbar pain 09/21/2016  . Cholelithiasis 06/03/2011   Past Medical History:  Diagnosis Date  . Anxiety   .  Breast cancer ([facility]) 05/2018   Right Breast Cancer  . Cancer ([facility]) 04/2018   right breast cancer  . Cholelithiasis   . Depression   . GERD (gastroesophageal reflux disease)   . History of kidney stones   . Hyperlipidemia   . Hypothyroidism   . Irritable bowel syndrome   . Personal history of radiation therapy 2020   Right Breast Cancer  . Pneumonia   . Thyroid disease   . Vitamin D deficiency     Family History  Problem Relation Age of Onset  . Cancer Mother        stomach  . Heart disease Father   . Cancer Maternal Aunt        ovarian  . Cancer Paternal Aunt        breast  . Diabetes Brother   . Hypertension Brother     Past Surgical History:  Procedure Laterality Date  . ABDOMINAL HYSTERECTOMY  [id number]  . ANTERIOR CERVICAL DECOMP/DISCECTOMY FUSION N/A 01/21/2017   Procedure: C3-4, C4-5 ANTERIOR CERVICAL DISCECTOMY AND FUSION, ALLOGRAFT, PLATE;  Surgeon: [name], MD;  Location: [facility];  Service: Orthopedics;  Laterality:  N/A;  . APPENDECTOMY  1996  . BREAST LUMPECTOMY Right 05/30/2018  . BREAST LUMPECTOMY WITH RADIOACTIVE SEED AND SENTINEL LYMPH NODE BIOPSY Right 05/30/2018   Procedure: RIGHT BREAST LUMPECTOMY WITH RADIOACTIVE SEED AND RIGHT AXILLARY SENTINEL LYMPH NODE BIOPSY;  Surgeon: Alphonsa Overall, MD;  Location: [facility];  Service: General;  Laterality: Right;  . BREAST MASS EXCISION  1997   left  . CHOLECYSTECTOMY  07/15/2011   Procedure: LAPAROSCOPIC CHOLECYSTECTOMY WITH INTRAOPERATIVE CHOLANGIOGRAM;  Surgeon: Shann Medal, MD;  Location: [facility];  Service: General;  Laterality: N/A;  Cholecystectomy with IOC  . TOE FUSION    . TUBAL LIGATION  1980   Social History   Occupational History  . Occupation: Retired     Fish farm manager: [employer]  . Occupation: full time    Comment: sitter  Tobacco Use  . Smoking status: Current Some Day Smoker    Packs/day: 0.25    Years: 36.00    Pack years: 9.00    Types: Cigarettes    Start date: 09/30/1973      Last attempt to quit: 04/26/2018    Years since quitting: 1.2  . Smokeless tobacco: Never Used  . Tobacco comment: a pack per month  Vaping Use  . Vaping Use: Never used  Substance and Sexual Activity  . Alcohol use: No    Alcohol/week: 0.0 standard drinks  . Drug use: No  . Sexual activity: Not on file

## 2019-08-13 NOTE — Telephone Encounter (Signed)
Spoke with pt and she says she has had multiple instances of "passing out" and this weekend she actually fell out in the floor and hit her head. She is dizzy and lightheaded and feels "foolish" like she is walking around drunk. Advised pt to go to ED but pt refused and only wanted to be seen here. I did schedule her with Hendricks Limes 08/15/19 at 10:35. Advised pt if she passes out again she needs to go to ED for evaluation. Pt voiced understanding.

## 2019-08-15 ENCOUNTER — Other Ambulatory Visit: Payer: Self-pay

## 2019-08-15 ENCOUNTER — Ambulatory Visit (HOSPITAL_COMMUNITY)
Admission: RE | Admit: 2019-08-15 | Discharge: 2019-08-15 | Disposition: A | Discharge status: Home / Self Care | Payer: Medicare HMO | Source: Ambulatory Visit | Attending: Family Medicine | Admitting: Family Medicine

## 2019-08-15 ENCOUNTER — Ambulatory Visit (INDEPENDENT_AMBULATORY_CARE_PROVIDER_SITE_OTHER): Payer: Medicare HMO | Admitting: Family Medicine

## 2019-08-15 ENCOUNTER — Encounter: Payer: Self-pay | Admitting: Family Medicine

## 2019-08-15 VITALS — BP 124/73 | HR 85 | Temp 97.6°F | Ht 63.0 in | Wt 143.0 lb

## 2019-08-15 DIAGNOSIS — R519 Headache, unspecified: Secondary | ICD-10-CM

## 2019-08-15 DIAGNOSIS — R55 Syncope and collapse: Secondary | ICD-10-CM

## 2019-08-15 DIAGNOSIS — R5383 Other fatigue: Secondary | ICD-10-CM | POA: Diagnosis not present

## 2019-08-15 DIAGNOSIS — E039 Hypothyroidism, unspecified: Secondary | ICD-10-CM | POA: Diagnosis not present

## 2019-08-15 DIAGNOSIS — R42 Dizziness and giddiness: Secondary | ICD-10-CM

## 2019-08-15 MED ORDER — LEVOTHYROXINE SODIUM 75 MCG PO TABS: 75.0000 ug | ORAL_TABLET | Freq: Every day | ORAL | 0 refills | Status: DC

## 2019-08-16 LAB — ANEMIA PROFILE B
Basophils Absolute: 0 10*3/uL (ref 0.0–0.2)
Basos: 1 %
EOS (ABSOLUTE): 0.2 10*3/uL (ref 0.0–0.4)
Eos: 4 %
Ferritin: 109 ng/mL (ref 15–150)
Folate: 14.2 ng/mL (ref >3.0)
Hematocrit: 39.2 % (ref 34.0–46.6)
Hemoglobin: 13.2 g/dL (ref 11.1–15.9)
Immature Grans (Abs): 0 10*3/uL (ref 0.0–0.1)
Immature Granulocytes: 1 %
Iron Saturation: 27 % (ref 15–55)
Iron: 82 ug/dL (ref 27–139)
Lymphocytes Absolute: 1.3 10*3/uL (ref 0.7–3.1)
Lymphs: 32 %
MCH: 31.2 pg (ref 26.6–33.0)
MCHC: 33.7 g/dL (ref 31.5–35.7)
MCV: 93 fL (ref 79–97)
Monocytes Absolute: 0.6 10*3/uL (ref 0.1–0.9)
Monocytes: 15 %
Neutrophils Absolute: 1.9 10*3/uL (ref 1.4–7.0)
Neutrophils: 47 %
Platelets: 246 10*3/uL (ref 150–450)
RBC: 4.23 x10E6/uL (ref 3.77–5.28)
RDW: 12.5 % (ref 11.7–15.4)
Retic Ct Pct: 1.2 % (ref 0.6–2.6)
Total Iron Binding Capacity: 304 ug/dL (ref 250–450)
UIBC: 222 ug/dL (ref 118–369)
Vitamin B-12: 473 pg/mL (ref 232–1245)
WBC: 4 10*3/uL (ref 3.4–10.8)

## 2019-08-16 LAB — CMP14+EGFR
ALT: 12 IU/L (ref 0–32)
AST: 14 IU/L (ref 0–40)
Albumin/Globulin Ratio: 2.2 (ref 1.2–2.2)
Albumin: 4.2 g/dL (ref 3.8–4.8)
Alkaline Phosphatase: 69 IU/L (ref 48–121)
BUN/Creatinine Ratio: 18 (ref 12–28)
BUN: 14 mg/dL (ref 8–27)
Bilirubin Total: 0.4 mg/dL (ref 0.0–1.2)
CO2: 21 mmol/L (ref 20–29)
Calcium: 8.7 mg/dL (ref 8.7–10.3)
Chloride: 106 mmol/L (ref 96–106)
Creatinine, Ser: 0.79 mg/dL (ref 0.57–1.00)
GFR calc Af Amer: 89 mL/min/{1.73_m2} (ref >59)
GFR calc non Af Amer: 77 mL/min/{1.73_m2} (ref >59)
Globulin, Total: 1.9 g/dL (ref 1.5–4.5)
Glucose: 98 mg/dL (ref 65–99)
Potassium: 3.7 mmol/L (ref 3.5–5.2)
Sodium: 140 mmol/L (ref 134–144)
Total Protein: 6.1 g/dL (ref 6.0–8.5)

## 2019-08-16 LAB — THYROID PANEL WITH TSH
Free Thyroxine Index: 2.3 (ref 1.2–4.9)
T3 Uptake Ratio: 30 % (ref 24–39)
T4, Total: 7.7 ug/dL (ref 4.5–12.0)
TSH: 0.926 u[IU]/mL (ref 0.450–4.500)

## 2019-08-16 LAB — VITAMIN D 25 HYDROXY (VIT D DEFICIENCY, FRACTURES): Vit D, 25-Hydroxy: 49.1 ng/mL (ref 30.0–100.0)

## 2019-08-18 NOTE — Progress Notes (Signed)
Assessment & Plan:  1. New onset headache - Anemia Profile B - CBC with Differential/Platelet - Thyroid Panel With TSH - CT Head Wo Contrast; Future  2. Syncope, unspecified syncope type - Discussed it is hard to work this up 10 days later and she needs to seek medical care when things like this occur.  - Anemia Profile B - CBC with Differential/Platelet - CMP14+EGFR - Thyroid Panel With TSH - VITAMIN D 25 Hydroxy (Vit-D Deficiency, Fractures) - CT Head Wo Contrast; Future  3. Dizziness - Anemia Profile B - CBC with Differential/Platelet - Thyroid Panel With TSH - CT Head Wo Contrast; Future  4. Fatigue, unspecified type - Anemia Profile B - CBC with Differential/Platelet - Thyroid Panel With TSH - VITAMIN D 25 Hydroxy (Vit-D Deficiency, Fractures)  5. Hypothyroidism, unspecified type - levothyroxine (SYNTHROID) 75 MCG tablet; Take 1 tablet (75 mcg total) by mouth daily.  Dispense: 90 tablet; Refill: 0 - Thyroid Panel With TSH   Follow up plan: Return if symptoms worsen or fail to improve.  Hendricks Limes, MSN, APRN, FNP-C [facility]  Subjective:   Patient ID: Jaclyn Schmidt, female    DOB: 1950/10/06, 69 y.o.   MRN: [id number]  HPI: Jaclyn Schmidt is a 69 y.o. female presenting on 08/15/2019 for Dizziness (Patient states that she has been having dizziness x 1 month on and off.) and Headache (Patient states that she had a bad headache sunday and passed out.)  Patient reports she has been experiencing dizziness on and off for the past month.  Also states she had a headache and passed out in the kitchen 10 days ago.  She reports she was just walking into the kitchen and hit the floor.  She states the same thing happened 2 years ago.  She reports these headaches when she passes out are very different from any other headache she has ever had.  She did not seek medical care until now.   ROS: Negative unless specifically indicated above in HPI.    Relevant past medical history reviewed and updated as indicated.   Allergies and medications reviewed and updated.   Current Outpatient Medications:  .  ALPRAZolam (XANAX) 0.5 MG tablet, Take 1 tablet (0.5 mg total) by mouth at bedtime as needed for anxiety., Disp: 20 tablet, Rfl: 1 .  BIOTIN PO, Take 2 tablets by mouth daily., Disp: , Rfl:  .  calcium carbonate (OSCAL) 1500 (600 Ca) MG TABS tablet, Take by mouth 2 (two) times daily with a meal., Disp: , Rfl:  .  cetirizine (ZYRTEC) 10 MG tablet, Take 10 mg by mouth daily., Disp: , Rfl:  .  Cholecalciferol (VITAMIN D3 PO), Take 4,000 Int'l Units by mouth daily., Disp: , Rfl:  .  fluticasone (FLONASE) 50 MCG/ACT nasal spray, Place 2 sprays into both nostrils daily as needed (FOR ALLERGIES.)., Disp: 48 g, Rfl: 4 .  gabapentin (NEURONTIN) 300 MG capsule, TAKE 3 CAPSULES (900 MG TOTAL) BY MOUTH AT BEDTIME. (Patient taking differently: 300 mg. One cap in am, one at noon, and two at bedtime.), Disp: 270 capsule, Rfl: 1 .  letrozole (FEMARA) 2.5 MG tablet, Take 2.5 mg by mouth daily., Disp: , Rfl:  .  levothyroxine (SYNTHROID) 75 MCG tablet, Take 1 tablet (75 mcg total) by mouth daily., Disp: 90 tablet, Rfl: 0 .  meclizine (ANTIVERT) 25 MG tablet, Take 1 tablet (25 mg total) by mouth 3 (three) times daily as needed for dizziness., Disp: 60 tablet, Rfl:  3 .  pravastatin (PRAVACHOL) 40 MG tablet, Take 1 tablet (40 mg total) by mouth daily., Disp: 90 tablet, Rfl: 0 .  sertraline (ZOLOFT) 50 MG tablet, Take 1 tablet (50 mg total) by mouth daily., Disp: 90 tablet, Rfl: 0  Current Facility-Administered Medications:  .  cyanocobalamin ((VITAMIN B-12)) injection 1,000 mcg, 1,000 mcg, Intramuscular, Q30 days, Evelina Dun A, FNP, 1,000 mcg at 08/03/19 8118  Allergies  Allergen Reactions  . Penicillins Rash and Other (See Comments)    PATIENT HAS HAD A PCN REACTION WITH IMMEDIATE RASH, FACIAL/TONGUE/THROAT SWELLING, SOB, OR LIGHTHEADEDNESS WITH  HYPOTENSION:  #  #  #  YES  #  #  #   Has patient had a PCN reaction causing severe rash involving mucus membranes or skin necrosis: No Has patient had a PCN reaction that required hospitalization: No Has patient had a PCN reaction occurring within the last 10 years: No If all of the above answers are "NO", then may proceed with Cephalosporin use.   Marland Kitchen Doxycycline Rash    Objective:   BP 124/73   Pulse 85   Temp 97.6 F (36.4 C) (Temporal)   Ht '5\' 3"'  (1.6 m)   Wt 143 lb (64.9 kg)   SpO2 94%   BMI 25.33 kg/m    Physical Exam Vitals reviewed.  Constitutional:      General: She is not in acute distress.    Appearance: Normal appearance. She is normal weight. She is not ill-appearing, toxic-appearing or diaphoretic.  HENT:     Head: Normocephalic and atraumatic.  Eyes:     General: No scleral icterus.       Right eye: No discharge.        Left eye: No discharge.     Extraocular Movements: Extraocular movements intact.     Conjunctiva/sclera: Conjunctivae normal.     Pupils: Pupils are equal, round, and reactive to light.     Left eye: Pupil is round.  Cardiovascular:     Rate and Rhythm: Normal rate and regular rhythm.     Heart sounds: Normal heart sounds. No murmur heard.  No friction rub. No gallop.   Pulmonary:     Effort: Pulmonary effort is normal. No respiratory distress.     Breath sounds: Normal breath sounds. No stridor. No wheezing, rhonchi or rales.  Musculoskeletal:        General: Normal range of motion.     Cervical back: Normal range of motion.  Skin:    General: Skin is warm and dry.     Capillary Refill: Capillary refill takes less than 2 seconds.  Neurological:     General: No focal deficit present.     Mental Status: She is alert and oriented to person, place, and time. Mental status is at baseline.     Cranial Nerves: Cranial nerves are intact.     Motor: Motor function is intact.     Coordination: Coordination is intact.     Gait: Gait is  intact.  Psychiatric:        Mood and Affect: Mood normal.        Behavior: Behavior normal.        Thought Content: Thought content normal.        Judgment: Judgment normal.

## 2019-08-21 ENCOUNTER — Other Ambulatory Visit: Payer: Self-pay | Admitting: Family

## 2019-08-21 DIAGNOSIS — Z0000 Encounter for general adult medical examination without abnormal findings: Secondary | ICD-10-CM

## 2019-08-21 DIAGNOSIS — F339 Major depressive disorder, recurrent, unspecified: Secondary | ICD-10-CM

## 2019-09-04 ENCOUNTER — Other Ambulatory Visit: Payer: Self-pay

## 2019-09-04 ENCOUNTER — Ambulatory Visit (INDEPENDENT_AMBULATORY_CARE_PROVIDER_SITE_OTHER): Payer: Medicare HMO | Admitting: *Deleted

## 2019-09-04 DIAGNOSIS — E538 Deficiency of other specified B group vitamins: Secondary | ICD-10-CM

## 2019-09-04 NOTE — Progress Notes (Signed)
Vitamin b12 injection given and patient tolerated well.  

## 2019-09-27 ENCOUNTER — Telehealth: Payer: Self-pay | Admitting: Orthopaedic Surgery

## 2019-09-27 NOTE — Telephone Encounter (Signed)
Patient called asking for a refill of meloxicam 15 mg. Patient asked that medication be sent to [facility] in [place]. Patient states she see Dr. Lorin Mercy in [place] office and they informed her to call her for her prescription refill. Please call patient when medication has been called in. Patient phone number is [phone].

## 2019-09-27 NOTE — Telephone Encounter (Signed)
Please advise 

## 2019-10-02 NOTE — Telephone Encounter (Signed)
Can you please advise on Mobic? I believe it was given to her from Sports Medicine, however, she has seen you for her problem since. OK to prescribe or does she need to call other office?

## 2019-10-02 NOTE — Telephone Encounter (Signed)
I do not see where we have prescribed Mobic.  Who gave this to her previously?

## 2019-10-03 MED ORDER — MELOXICAM 15 MG PO TABS: 15.0000 mg | ORAL_TABLET | Freq: Every day | ORAL | 4 refills | Status: DC

## 2019-10-03 NOTE — Telephone Encounter (Signed)
Sent to pharmacy. I left voicemail for patient advising.

## 2019-10-03 NOTE — Telephone Encounter (Signed)
OK refill times 4 months thanks

## 2019-10-05 ENCOUNTER — Other Ambulatory Visit: Payer: Self-pay

## 2019-10-05 ENCOUNTER — Ambulatory Visit (INDEPENDENT_AMBULATORY_CARE_PROVIDER_SITE_OTHER): Payer: Medicare HMO | Admitting: *Deleted

## 2019-10-05 DIAGNOSIS — E538 Deficiency of other specified B group vitamins: Secondary | ICD-10-CM | POA: Diagnosis not present

## 2019-10-05 NOTE — Progress Notes (Signed)
B12 Injection given and tolerated well °

## 2019-10-12 DIAGNOSIS — Z79811 Long term (current) use of aromatase inhibitors: Secondary | ICD-10-CM | POA: Diagnosis not present

## 2019-10-12 DIAGNOSIS — Z79899 Other long term (current) drug therapy: Secondary | ICD-10-CM | POA: Diagnosis not present

## 2019-10-12 DIAGNOSIS — Z170 Estrogen receptor positive status [ER+]: Secondary | ICD-10-CM | POA: Diagnosis not present

## 2019-10-12 DIAGNOSIS — C50511 Malignant neoplasm of lower-outer quadrant of right female breast: Secondary | ICD-10-CM | POA: Diagnosis not present

## 2019-10-12 DIAGNOSIS — M8580 Other specified disorders of bone density and structure, unspecified site: Secondary | ICD-10-CM | POA: Diagnosis not present

## 2019-10-12 DIAGNOSIS — Z87891 Personal history of nicotine dependence: Secondary | ICD-10-CM | POA: Diagnosis not present

## 2019-10-12 DIAGNOSIS — E538 Deficiency of other specified B group vitamins: Secondary | ICD-10-CM | POA: Diagnosis not present

## 2019-10-23 IMAGING — DX DG CHEST 2V
2 series · 2 of 2 positions shown · non-contrast
Comparison: 06/02/2015.

CLINICAL DATA: Chest congestion.

EXAM:
CHEST - 2 VIEW

[chest pa]
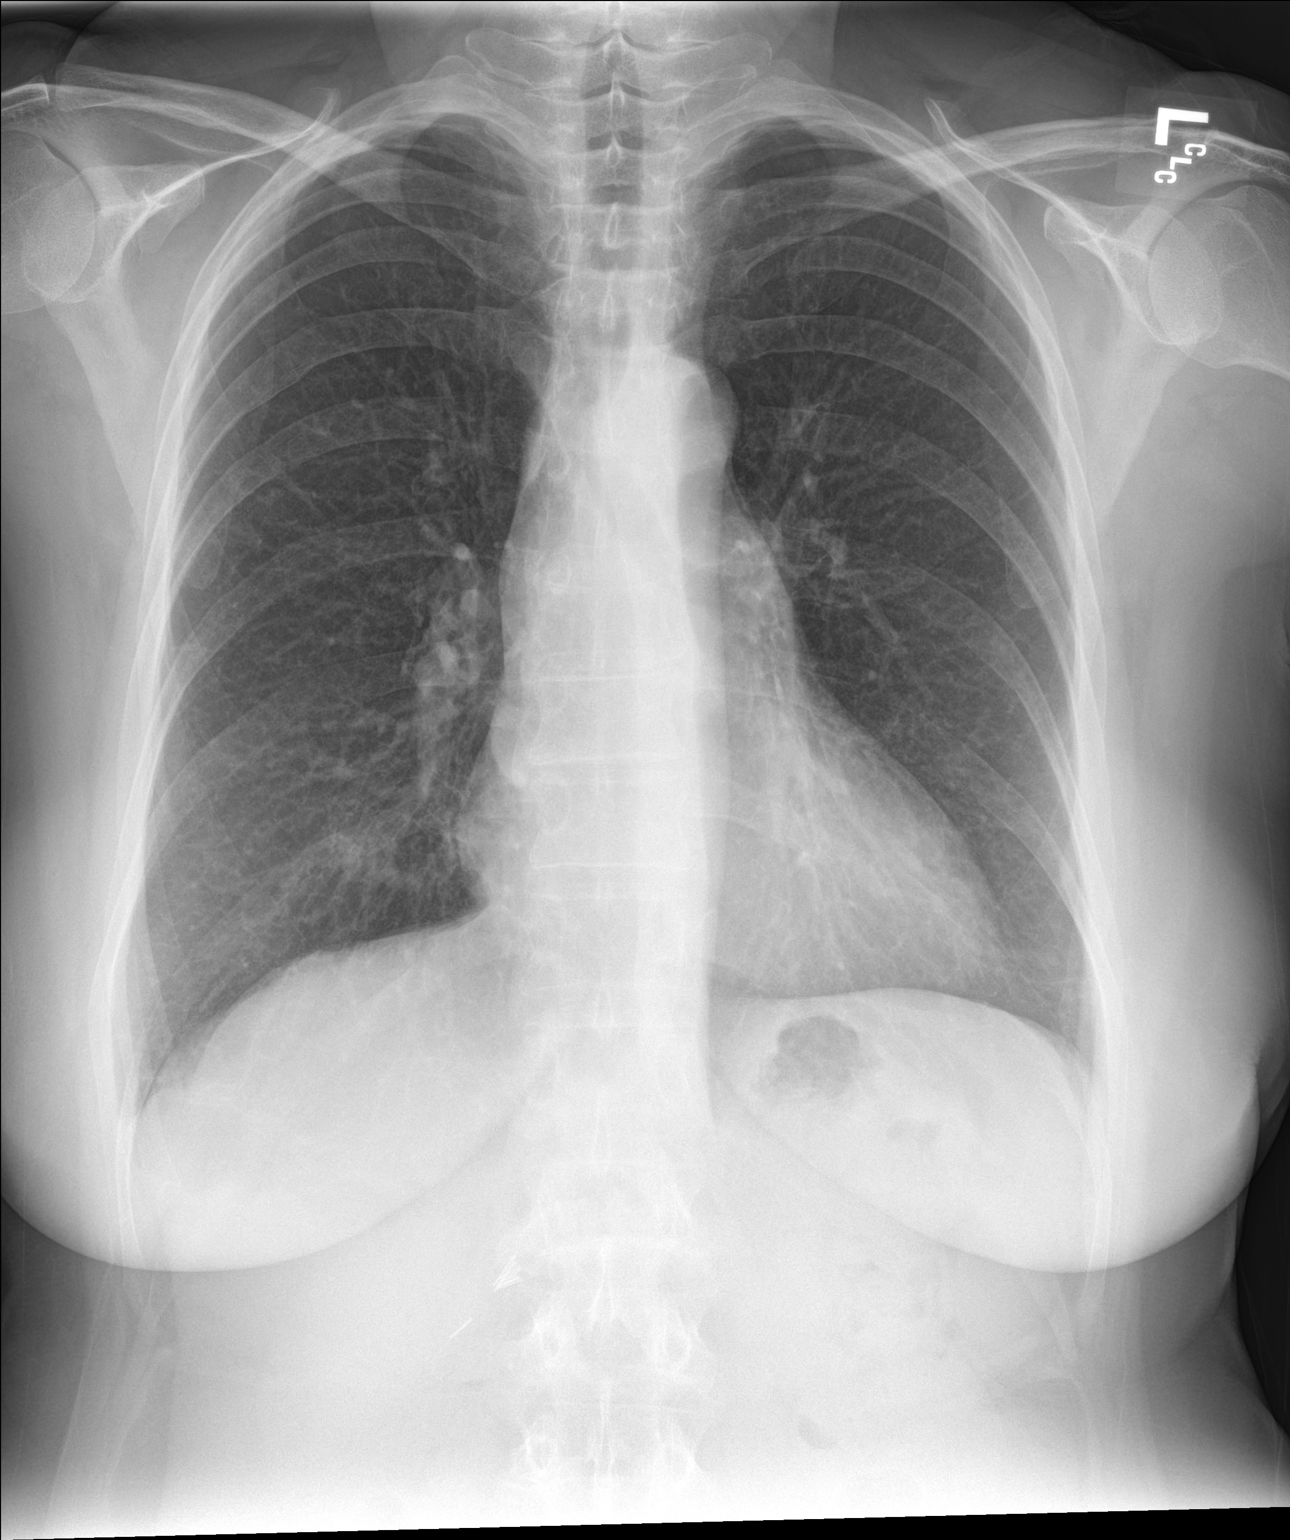

[chest lat]
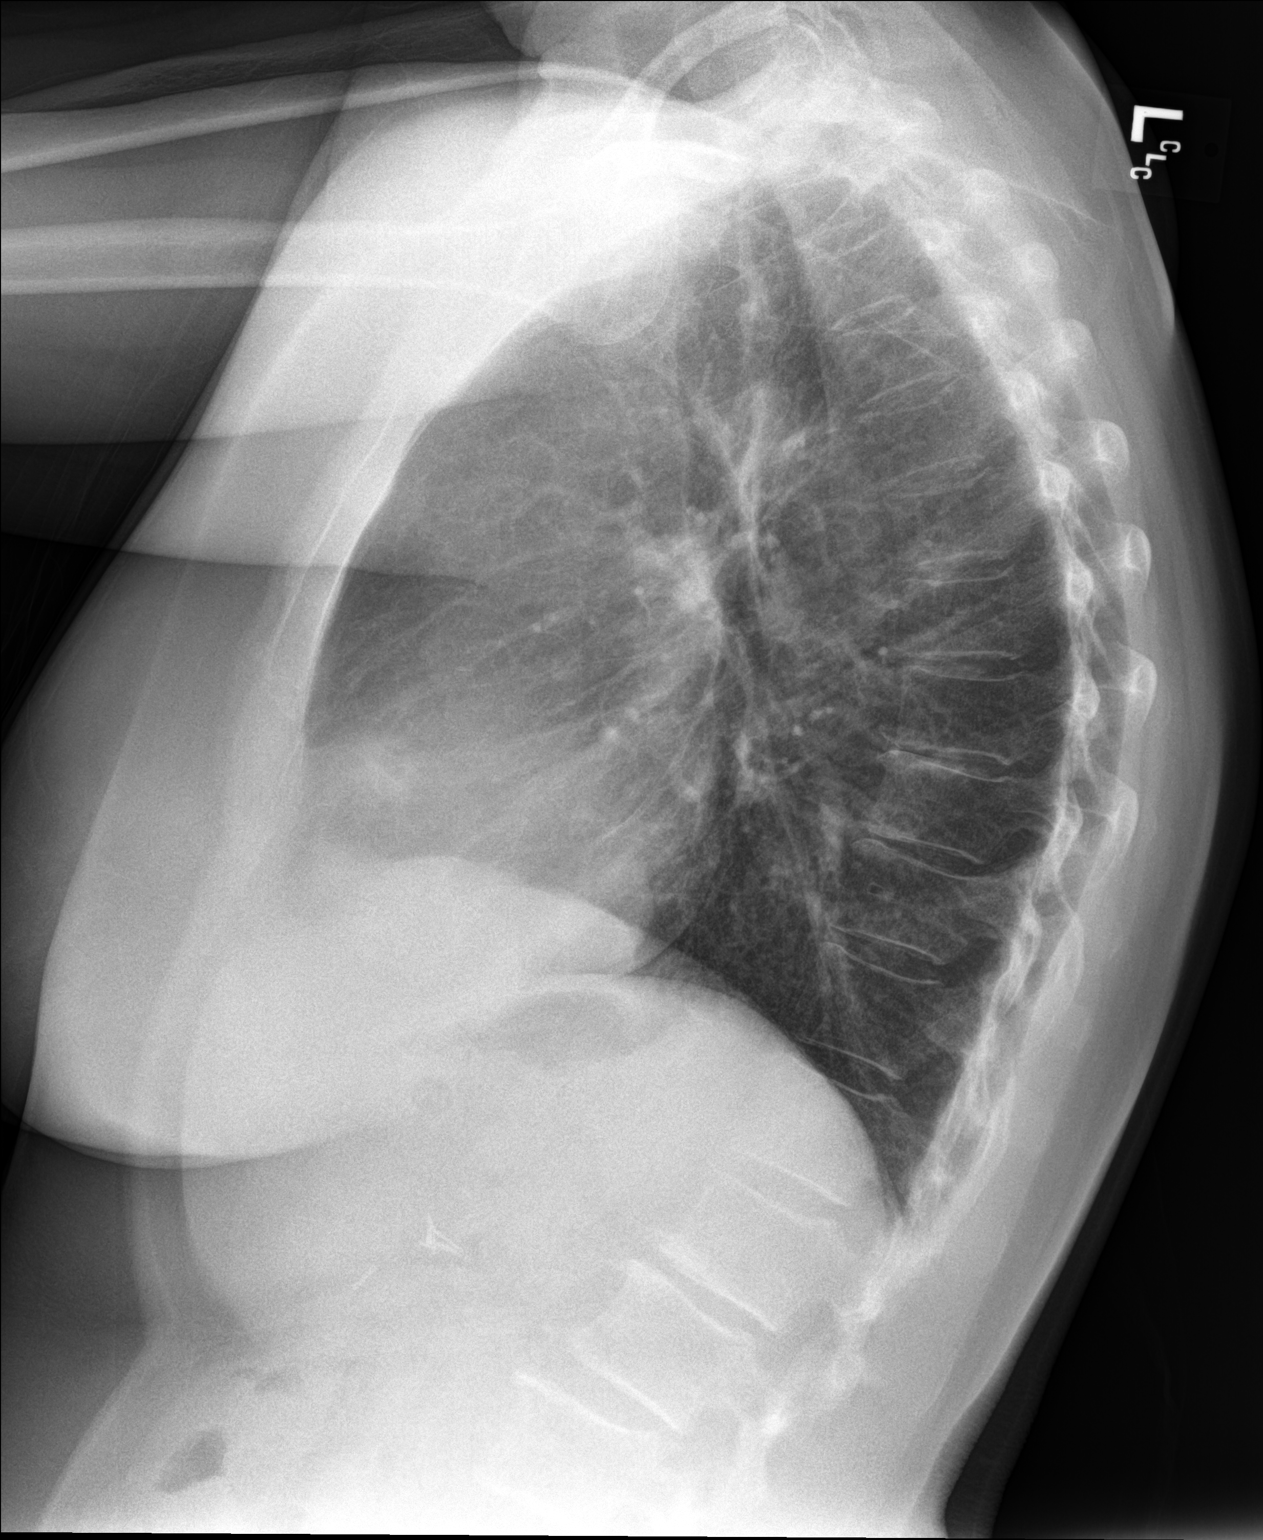

[2 of 2 positions shown; findings below may reference images not displayed]

FINDINGS: Mediastinum hilar structures normal. Right middle lobe small density
noted. This could represent area of atelectasis and or pneumonia.
Follow-up chest x-ray suggested to demonstrate clearing. This region
does not clear nonenhanced chest CT suggested for further
evaluation. No pleural effusion or pneumothorax. Mild cardiomegaly.
No pulmonary venous congestion. Degenerative change thoracic spine.
Surgical clips right upper quadrant...
IMPRESSION: Right middle lobe small density noted. This could represent area of
atelectasis and or pneumonia. Follow-up chest x-ray suggested to
demonstrate clearing. If this region does not clear nonenhanced
chest CT suggested for further evaluation.

## 2019-10-26 ENCOUNTER — Other Ambulatory Visit: Payer: Self-pay

## 2019-10-26 ENCOUNTER — Ambulatory Visit (INDEPENDENT_AMBULATORY_CARE_PROVIDER_SITE_OTHER): Payer: Medicare HMO

## 2019-10-26 DIAGNOSIS — Z23 Encounter for immunization: Secondary | ICD-10-CM

## 2019-11-05 ENCOUNTER — Ambulatory Visit (INDEPENDENT_AMBULATORY_CARE_PROVIDER_SITE_OTHER): Payer: Medicare HMO | Admitting: *Deleted

## 2019-11-05 ENCOUNTER — Other Ambulatory Visit: Payer: Self-pay

## 2019-11-05 DIAGNOSIS — E538 Deficiency of other specified B group vitamins: Secondary | ICD-10-CM

## 2019-11-05 NOTE — Progress Notes (Signed)
Patient in today for monthly B12 injection. 1000 mcg given in left deltoid. Patient tolerated well.  °

## 2019-11-13 ENCOUNTER — Other Ambulatory Visit: Payer: Self-pay | Admitting: Family Medicine

## 2019-11-13 DIAGNOSIS — E039 Hypothyroidism, unspecified: Secondary | ICD-10-CM

## 2019-11-15 ENCOUNTER — Other Ambulatory Visit: Payer: Self-pay

## 2019-11-15 ENCOUNTER — Ambulatory Visit (INDEPENDENT_AMBULATORY_CARE_PROVIDER_SITE_OTHER): Payer: Medicare HMO | Admitting: Orthopaedic Surgery

## 2019-11-15 ENCOUNTER — Encounter: Payer: Self-pay | Admitting: Orthopaedic Surgery

## 2019-11-15 VITALS — BP 129/91 | HR 85 | Ht 63.0 in | Wt 141.0 lb

## 2019-11-15 DIAGNOSIS — E876 Hypokalemia: Secondary | ICD-10-CM | POA: Diagnosis not present

## 2019-11-15 DIAGNOSIS — M7541 Impingement syndrome of right shoulder: Secondary | ICD-10-CM | POA: Diagnosis not present

## 2019-11-15 DIAGNOSIS — M7712 Lateral epicondylitis, left elbow: Secondary | ICD-10-CM

## 2019-11-15 MED ORDER — BUPIVACAINE HCL 0.25 % IJ SOLN
4.0000 mL | INTRAMUSCULAR | Status: AC | PRN
  Administered 2019-11-15: 4 mL via INTRA_ARTICULAR

## 2019-11-15 MED ORDER — METHYLPREDNISOLONE ACETATE 40 MG/ML IJ SUSP
40.0000 mg | INTRAMUSCULAR | Status: AC | PRN
  Administered 2019-11-15: 40 mg via INTRA_ARTICULAR

## 2019-11-15 MED ORDER — LIDOCAINE HCL 1 % IJ SOLN
0.5000 mL | INTRAMUSCULAR | Status: AC | PRN
  Administered 2019-11-15: .5 mL

## 2019-11-15 NOTE — Progress Notes (Signed)
Office Visit Note   Patient: Jaclyn Schmidt           Date of Birth: 1950-07-15           MRN: [id number] Visit Date: 11/15/2019              Requested by: Sharion Balloon, [address] [place],  [place] [address] PCP: Sharion Balloon, FNP   Assessment & Plan: Visit Diagnoses:  1. Impingement syndrome of right shoulder   2. Left tennis elbow     Plan: Right tennis elbow brace pathophysiology discussed usage discussed.  Subacromial injection performed right shoulder.  She'll let us know she has persistent symptoms.  Previous MRI showed tendinopathy but no full-thickness tear.  Follow-Up Instructions: Return if symptoms worsen or fail to improve.   Orders:  No orders of the defined types were placed in this encounter.  No orders of the defined types were placed in this encounter.     Procedures: Large Joint Inj: R subacromial bursa on 11/15/2019 4:03 PM Indications: pain Details: 22 G 1.5 in needle  Arthrogram: No  Medications: 4 mL bupivacaine 0.25 %; 40 mg methylPREDNISolone acetate 40 MG/ML; 0.5 mL lidocaine 1 % Outcome: tolerated well, no immediate complications Procedure, treatment alternatives, risks and benefits explained, specific risks discussed. Consent was given by the patient. Immediately prior to procedure a time out was called to verify the correct patient, procedure, equipment, support staff and site/side marked as required. Patient was prepped and draped in the usual sterile fashion.       Clinical Data: No additional findings.   Subjective: Chief Complaint  Patient presents with  . Right Arm - Pain  . Left Elbow - Pain    HPI 69 year old female returns she had the breast cancer on the right with radiation is having an ongoing problems with her right shoulder with some impingement.  Previous injection in July gave her good relief and she is requesting a repeat injection.  She is also had problems with her left elbow particularly gripping  and squeezing.  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  She is not noticed anything recently that she been doing that may have caused the pain but has pain with gripping squeezing and points directly the lateral epicondyle.  Said previous cervical fusion which is well-healed.  Review of Systems all other systems are negative is obtained HPI.   Objective: Vital Signs: BP (!) 129/91   Pulse 85   Ht 5\' 3"  (1.6 m)   Wt 141 lb (64 kg)   BMI 24.98 kg/m   Physical Exam Constitutional:      Appearance: She is well-developed.  HENT:     Head: Normocephalic.     Right Ear: External ear normal.     Left Ear: External ear normal.  Eyes:     Pupils: Pupils are equal, round, and reactive to light.  Neck:     Thyroid: No thyromegaly.     Trachea: No tracheal deviation.  Cardiovascular:     Rate and Rhythm: Normal rate.  Pulmonary:     Effort: Pulmonary effort is normal.  Abdominal:     Palpations: Abdomen is soft.  Skin:    General: Skin is warm and dry.  Neurological:     Mental Status: She is alert and oriented to person, place, and time.  Psychiatric:        Behavior: Behavior normal.  Ortho Exam patient has moderate tenderness of the lateral condyle pain with gripping pain with resisted wrist extension.  Reflexes are intact well-healed cervical incision.  Positive impingement right shoulder negative left shoulder.  Long head of the biceps is nontender right and left.  Specialty Comments:  No specialty comments available.  Imaging: No results found.   PMFS History: Patient Active Problem List   Diagnosis Date Noted  . Left tennis elbow 11/15/2019  . Impingement syndrome of right shoulder 08/13/2019  . Malignant neoplasm of lower-inner quadrant of right breast of female, estrogen receptor positive ([facility]) 04/26/2018  . Vertigo 09/05/2017  . Hypothyroidism 07/18/2017  . Osteopenia 03/09/2017  . Anal sphincter incompetence 12/29/2016    . Depression, recurrent ([place]) 12/29/2016  . Chronic midline thoracic back pain 09/21/2016  . Lumbar pain 09/21/2016  . Cholelithiasis 06/03/2011   Past Medical History:  Diagnosis Date  . Anxiety   . Breast cancer ([facility]) 05/2018   Right Breast Cancer  . Cancer ([facility]) 04/2018   right breast cancer  . Cholelithiasis   . Depression   . GERD (gastroesophageal reflux disease)   . History of kidney stones   . Hyperlipidemia   . Hypothyroidism   . Irritable bowel syndrome   . Personal history of radiation therapy 2020   Right Breast Cancer  . Pneumonia   . Thyroid disease   . Vitamin D deficiency     Family History  Problem Relation Age of Onset  . Cancer Mother        stomach  . Heart disease Father   . Cancer Maternal Aunt        ovarian  . Cancer Paternal Aunt        breast  . Diabetes Brother   . Hypertension Brother     Past Surgical History:  Procedure Laterality Date  . ABDOMINAL HYSTERECTOMY  [id number]  . ANTERIOR CERVICAL DECOMP/DISCECTOMY FUSION N/A 01/21/2017   Procedure: C3-4, C4-5 ANTERIOR CERVICAL DISCECTOMY AND FUSION, ALLOGRAFT, PLATE;  Surgeon: [name], MD;  Location: [facility];  Service: Orthopedics;  Laterality: N/A;  . APPENDECTOMY  1996  . BREAST LUMPECTOMY Right 05/30/2018  . BREAST LUMPECTOMY WITH RADIOACTIVE SEED AND SENTINEL LYMPH NODE BIOPSY Right 05/30/2018   Procedure: RIGHT BREAST LUMPECTOMY WITH RADIOACTIVE SEED AND RIGHT AXILLARY SENTINEL LYMPH NODE BIOPSY;  Surgeon: Alphonsa Overall, MD;  Location: [facility];  Service: General;  Laterality: Right;  . BREAST MASS EXCISION  1997   left  . CHOLECYSTECTOMY  07/15/2011   Procedure: LAPAROSCOPIC CHOLECYSTECTOMY WITH INTRAOPERATIVE CHOLANGIOGRAM;  Surgeon: Shann Medal, MD;  Location: [facility];  Service: General;  Laterality: N/A;  Cholecystectomy with IOC  . TOE FUSION    . TUBAL LIGATION  1980   Social History   Occupational History  . Occupation: Retired     Fish farm manager: [employer]   . Occupation: full time    Comment: sitter  Tobacco Use  . Smoking status: Current Some Day Smoker    Packs/day: 0.25    Years: 36.00    Pack years: 9.00    Types: Cigarettes    Start date: 09/30/1973    Last attempt to quit: 04/26/2018    Years since quitting: 1.5  . Smokeless tobacco: Never Used  . Tobacco comment: a pack per month  Vaping Use  . Vaping Use: Never used  Substance and Sexual Activity  . Alcohol use: No    Alcohol/week: 0.0 standard drinks  . Drug use: No  .  Sexual activity: Not on file

## 2019-11-18 DIAGNOSIS — N3001 Acute cystitis with hematuria: Secondary | ICD-10-CM | POA: Diagnosis not present

## 2019-11-18 DIAGNOSIS — R300 Dysuria: Secondary | ICD-10-CM | POA: Diagnosis not present

## 2019-11-22 DIAGNOSIS — Z170 Estrogen receptor positive status [ER+]: Secondary | ICD-10-CM | POA: Diagnosis not present

## 2019-11-22 DIAGNOSIS — C50911 Malignant neoplasm of unspecified site of right female breast: Secondary | ICD-10-CM | POA: Diagnosis not present

## 2019-11-22 DIAGNOSIS — R69 Illness, unspecified: Secondary | ICD-10-CM | POA: Diagnosis not present

## 2019-11-27 ENCOUNTER — Telehealth: Payer: Self-pay

## 2019-11-27 MED ORDER — GABAPENTIN 300 MG PO CAPS: ORAL_CAPSULE | ORAL | 4 refills | Status: DC

## 2019-11-27 NOTE — Telephone Encounter (Signed)
Prescription sent to pharmacy.

## 2019-11-27 NOTE — Telephone Encounter (Signed)
Please send the rx to [facility].

## 2019-11-27 NOTE — Telephone Encounter (Signed)
  Prescription Request  11/27/2019  What is the name of the medication or equipment? gabapentin (NEURONTIN) 300 MG capsule Pt she wants 4 a day. She takes 2 in the am and 2 at pm  Have you contacted your pharmacy to request a refill? (if applicable) no  Which [facility] you [facility]    Patient notified that their request is being sent to the clinical staff for review and that they should receive a response within 2 business days.

## 2019-11-27 NOTE — Telephone Encounter (Signed)
Sent to [facility]

## 2019-11-27 NOTE — Addendum Note (Signed)
Addended by: Zannie Cove on: 11/27/2019 04:17 PM   Modules accepted: Orders

## 2019-11-27 NOTE — Telephone Encounter (Signed)
Please advise if change is okay- if so please re send rx

## 2019-12-06 ENCOUNTER — Ambulatory Visit (INDEPENDENT_AMBULATORY_CARE_PROVIDER_SITE_OTHER): Payer: Medicare HMO | Admitting: *Deleted

## 2019-12-06 ENCOUNTER — Other Ambulatory Visit: Payer: Self-pay

## 2019-12-06 DIAGNOSIS — E538 Deficiency of other specified B group vitamins: Secondary | ICD-10-CM | POA: Diagnosis not present

## 2019-12-06 NOTE — Progress Notes (Signed)
Patient in today for monthly B 12 injection. 1000 mcg given IM in left deltoid. Patient tolerated well.  

## 2019-12-20 ENCOUNTER — Encounter: Payer: Self-pay | Admitting: *Deleted

## 2019-12-27 ENCOUNTER — Ambulatory Visit: Payer: Medicare HMO | Admitting: Family

## 2020-01-04 ENCOUNTER — Ambulatory Visit (INDEPENDENT_AMBULATORY_CARE_PROVIDER_SITE_OTHER): Payer: Medicare HMO | Admitting: Family

## 2020-01-04 ENCOUNTER — Other Ambulatory Visit: Payer: Self-pay

## 2020-01-04 ENCOUNTER — Encounter: Payer: Self-pay | Admitting: Family

## 2020-01-04 ENCOUNTER — Telehealth: Payer: Self-pay | Admitting: Family

## 2020-01-04 VITALS — BP 119/71 | HR 70 | Temp 97.0°F | Ht 63.0 in | Wt 144.0 lb

## 2020-01-04 DIAGNOSIS — R69 Illness, unspecified: Secondary | ICD-10-CM | POA: Diagnosis not present

## 2020-01-04 DIAGNOSIS — M546 Pain in thoracic spine: Secondary | ICD-10-CM

## 2020-01-04 DIAGNOSIS — G8929 Other chronic pain: Secondary | ICD-10-CM

## 2020-01-04 DIAGNOSIS — C50311 Malignant neoplasm of lower-inner quadrant of right female breast: Secondary | ICD-10-CM

## 2020-01-04 DIAGNOSIS — Z79899 Other long term (current) drug therapy: Secondary | ICD-10-CM | POA: Diagnosis not present

## 2020-01-04 DIAGNOSIS — Z170 Estrogen receptor positive status [ER+]: Secondary | ICD-10-CM

## 2020-01-04 DIAGNOSIS — E785 Hyperlipidemia, unspecified: Secondary | ICD-10-CM | POA: Diagnosis not present

## 2020-01-04 DIAGNOSIS — J441 Chronic obstructive pulmonary disease with (acute) exacerbation: Secondary | ICD-10-CM | POA: Insufficient documentation

## 2020-01-04 DIAGNOSIS — K6289 Other specified diseases of anus and rectum: Secondary | ICD-10-CM | POA: Diagnosis not present

## 2020-01-04 DIAGNOSIS — Z0000 Encounter for general adult medical examination without abnormal findings: Secondary | ICD-10-CM | POA: Diagnosis not present

## 2020-01-04 DIAGNOSIS — M545 Low back pain, unspecified: Secondary | ICD-10-CM

## 2020-01-04 DIAGNOSIS — F411 Generalized anxiety disorder: Secondary | ICD-10-CM | POA: Diagnosis not present

## 2020-01-04 DIAGNOSIS — E039 Hypothyroidism, unspecified: Secondary | ICD-10-CM

## 2020-01-04 DIAGNOSIS — F339 Major depressive disorder, recurrent, unspecified: Secondary | ICD-10-CM

## 2020-01-04 MED ORDER — MECLIZINE HCL 25 MG PO TABS: 25.0000 mg | ORAL_TABLET | Freq: Three times a day (TID) | ORAL | 3 refills | Status: DC | PRN

## 2020-01-04 MED ORDER — PRAVASTATIN SODIUM 40 MG PO TABS: 40.0000 mg | ORAL_TABLET | Freq: Every day | ORAL | 1 refills | Status: DC

## 2020-01-04 MED ORDER — ALPRAZOLAM 0.5 MG PO TABS: 0.5000 mg | ORAL_TABLET | Freq: Every evening | ORAL | 1 refills | Status: DC | PRN

## 2020-01-04 MED ORDER — LEVOTHYROXINE SODIUM 75 MCG PO TABS: 75.0000 ug | ORAL_TABLET | Freq: Every day | ORAL | 2 refills | Status: DC

## 2020-01-04 MED ORDER — MELOXICAM 15 MG PO TABS: 15.0000 mg | ORAL_TABLET | Freq: Every day | ORAL | 4 refills | Status: DC

## 2020-01-04 MED ORDER — GABAPENTIN 300 MG PO CAPS: ORAL_CAPSULE | ORAL | 4 refills | Status: DC

## 2020-01-04 MED ORDER — PREDNISONE 10 MG (21) PO TBPK
ORAL_TABLET | ORAL | 0 refills | Status: DC
Start: 2020-01-04 — End: 2020-01-22

## 2020-01-04 MED ORDER — SERTRALINE HCL 50 MG PO TABS: 50.0000 mg | ORAL_TABLET | Freq: Every day | ORAL | 1 refills | Status: DC

## 2020-01-04 NOTE — Patient Instructions (Signed)
Health Maintenance After Age 69 After age 69, you are at a higher risk for certain long-term diseases and infections as well as injuries from falls. Falls are a major cause of broken bones and head injuries in people who are older than age 69. Getting regular preventive care can help to keep you healthy and well. Preventive care includes getting regular testing and making lifestyle changes as recommended by your health care provider. Talk with your health care provider about:  Which screenings and tests you should have. A screening is a test that checks for a disease when you have no symptoms.  A diet and exercise plan that is right for you. What should I know about screenings and tests to prevent falls? Screening and testing are the best ways to find a health problem early. Early diagnosis and treatment give you the best chance of managing medical conditions that are common after age 69. Certain conditions and lifestyle choices may make you more likely to have a fall. Your health care provider may recommend:  Regular vision checks. Poor vision and conditions such as cataracts can make you more likely to have a fall. If you wear glasses, make sure to get your prescription updated if your vision changes.  Medicine review. Work with your health care provider to regularly review all of the medicines you are taking, including over-the-counter medicines. Ask your health care provider about any side effects that may make you more likely to have a fall. Tell your health care provider if any medicines that you take make you feel dizzy or sleepy.  Osteoporosis screening. Osteoporosis is a condition that causes the bones to get weaker. This can make the bones weak and cause them to break more easily.  Blood pressure screening. Blood pressure changes and medicines to control blood pressure can make you feel dizzy.  Strength and balance checks. Your health care provider may recommend certain tests to check your  strength and balance while standing, walking, or changing positions.  Foot health exam. Foot pain and numbness, as well as not wearing proper footwear, can make you more likely to have a fall.  Depression screening. You may be more likely to have a fall if you have a fear of falling, feel emotionally low, or feel unable to do activities that you used to do.  Alcohol use screening. Using too much alcohol can affect your balance and may make you more likely to have a fall. What actions can I take to lower my risk of falls? General instructions  Talk with your health care provider about your risks for falling. Tell your health care provider if: ? You fall. Be sure to tell your health care provider about all falls, even ones that seem minor. ? You feel dizzy, sleepy, or off-balance.  Take over-the-counter and prescription medicines only as told by your health care provider. These include any supplements.  Eat a healthy diet and maintain a healthy weight. A healthy diet includes low-fat dairy products, low-fat (lean) meats, and fiber from whole grains, beans, and lots of fruits and vegetables. Home safety  Remove any tripping hazards, such as rugs, cords, and clutter.  Install safety equipment such as grab bars in bathrooms and safety rails on stairs.  Keep rooms and walkways well-lit. Activity   Follow a regular exercise program to stay fit. This will help you maintain your balance. Ask your health care provider what types of exercise are appropriate for you.  If you need a cane or   walker, use it as recommended by your health care provider.  Wear supportive shoes that have nonskid soles. Lifestyle  Do not drink alcohol if your health care provider tells you not to drink.  If you drink alcohol, limit how much you have: ? 0-1 drink a day for women. ? 0-2 drinks a day for men.  Be aware of how much alcohol is in your drink. In the [place] one drink equals one typical bottle of beer (12  oz), one-half glass of wine (5 oz), or one shot of hard liquor (1 oz).  Do not use any products that contain nicotine or tobacco, such as cigarettes and e-cigarettes. If you need help quitting, ask your health care provider. Summary  Having a healthy lifestyle and getting preventive care can help to protect your health and wellness after age 69.  Screening and testing are the best way to find a health problem early and help you avoid having a fall. Early diagnosis and treatment give you the best chance for managing medical conditions that are more common for people who are older than age 69.  Falls are a major cause of broken bones and head injuries in people who are older than age 69. Take precautions to prevent a fall at home.  Work with your health care provider to learn what changes you can make to improve your health and wellness and to prevent falls. This information is not intended to replace advice given to you by your health care provider. Make sure you discuss any questions you have with your health care provider. Document Revised: 04/27/2018 Document Reviewed: 11/17/2016 Elsevier Patient Education  2020 Elsevier Inc.  

## 2020-01-04 NOTE — Progress Notes (Signed)
Subjective:    Patient ID: Jaclyn Schmidt, female    DOB: [birth year], 69 y.o.   MRN: [id number]  Chief Complaint  Patient presents with  . Medical Management of Chronic Issues  . Hypothyroidism  . Cough    Productive  cough with yellow mucus x 2 weeks and nasal congestion   PT presents to the office today for chronic follow up. She is followed by Oncologists every 3-4 months for Right Breast cancer. She completed radiation 2020. States she is doing well at this time. She has anal sphincter incompetence and taking  Metamucil. She does pelvic floor exercises daily.   She also has Vit B 12 deficiency and gets monthly injections.  Cough This is a new problem. The current episode started 1 to 4 weeks ago. The problem has been waxing and waning. The problem occurs every few minutes. The cough is non-productive. Associated symptoms include nasal congestion and wheezing. Pertinent negatives include no ear congestion, ear pain, fever or shortness of breath. Risk factors for lung disease include smoking/tobacco exposure. The treatment provided mild relief. Her past medical history is significant for emphysema.  Thyroid Problem Presents for follow-up visit. Symptoms include depressed mood. Patient reports no anxiety, constipation, dry skin or fatigue. The symptoms have been stable. Her past medical history is significant for hyperlipidemia.  Depression        This is a chronic problem.  The current episode started more than 1 year ago.   The onset quality is sudden.   The problem occurs intermittently.  Associated symptoms include irritable and restlessness.  Associated symptoms include no fatigue, no helplessness, no hopelessness and not sad.  Past medical history includes thyroid problem and anxiety.   Hyperlipidemia This is a chronic problem. The current episode started more than 1 year ago. Pertinent negatives include no shortness of breath. Current antihyperlipidemic treatment includes statins.  The current treatment provides moderate improvement of lipids. Risk factors for coronary artery disease include dyslipidemia and a sedentary lifestyle.  Anxiety Presents for follow-up visit. Symptoms include depressed mood, irritability and restlessness. Patient reports no nervous/anxious behavior or shortness of breath. Symptoms occur most days. The severity of symptoms is moderate.    Nicotine Dependence Presents for follow-up visit. Symptoms include irritability. Symptoms are negative for fatigue. Her urge triggers include company of smokers. She smokes < 1/2 a pack of cigarettes per day.  Back Pain This is a chronic problem. The current episode started more than 1 year ago. The problem occurs intermittently. The pain is present in the lumbar spine. The pain is moderate. Pertinent negatives include no fever.      Review of Systems  Constitutional: Positive for irritability. Negative for fatigue and fever.  HENT: Negative for ear pain.   Respiratory: Positive for cough and wheezing. Negative for shortness of breath.   Gastrointestinal: Negative for constipation.  Musculoskeletal: Positive for back pain.  Psychiatric/Behavioral: Positive for depression. The patient is not nervous/anxious.   All other systems reviewed and are negative.      Objective:   Physical Exam Vitals reviewed.  Constitutional:      General: She is irritable. She is not in acute distress.    Appearance: She is well-developed and well-nourished.  HENT:     Head: Normocephalic and atraumatic.     Right Ear: Tympanic membrane normal.     Left Ear: Tympanic membrane normal.     Mouth/Throat:     Mouth: Oropharynx is clear and moist.  Eyes:  Pupils: Pupils are equal, round, and reactive to light.  Neck:     Thyroid: No thyromegaly.  Cardiovascular:     Rate and Rhythm: Normal rate and regular rhythm.     Pulses: Intact distal pulses.     Heart sounds: Normal heart sounds. No murmur  heard.   Pulmonary:     Effort: Pulmonary effort is normal. No respiratory distress.     Breath sounds: Normal breath sounds. No wheezing.  Abdominal:     General: Bowel sounds are normal. There is no distension.     Palpations: Abdomen is soft.     Tenderness: There is no abdominal tenderness.  Musculoskeletal:        General: No tenderness or edema. Normal range of motion.     Cervical back: Normal range of motion and neck supple.  Skin:    General: Skin is warm and dry.  Neurological:     Mental Status: She is alert and oriented to person, place, and time.     Cranial Nerves: No cranial nerve deficit.     Deep Tendon Reflexes: Reflexes are normal and symmetric.  Psychiatric:        Mood and Affect: Mood and affect normal.        Behavior: Behavior normal.        Thought Content: Thought content normal.        Judgment: Judgment normal.       BP 119/71   Pulse 70   Temp (!) 97 F (36.1 C) (Temporal)   Ht _0  (1.6 m)   Wt 144 lb (65.3 kg)   SpO2 98%   BMI 25.51 kg/m      Assessment & Plan:  Jaclyn Schmidt comes in today with chief complaint of Medical Management of Chronic Issues, Hypothyroidism, and Cough (Productive  cough with yellow mucus x 2 weeks and nasal congestion)   Diagnosis and orders addressed:  1. Controlled substance agreement signed - ALPRAZolam (XANAX) 0.5 MG tablet; Take 1 tablet (0.5 mg total) by mouth at bedtime as needed for anxiety.  Dispense: 20 tablet; Refill: 1 - CMP14+EGFR - CBC with Differential/Platelet  2. GAD (generalized anxiety disorder) - ALPRAZolam (XANAX) 0.5 MG tablet; Take 1 tablet (0.5 mg total) by mouth at bedtime as needed for anxiety.  Dispense: 20 tablet; Refill: 1 - CMP14+EGFR - CBC with Differential/Platelet  3. Depression, recurrent ([place]) - sertraline (ZOLOFT) 50 MG tablet; Take 1 tablet (50 mg total) by mouth daily.  Dispense: 90 tablet; Refill: 1 - CMP14+EGFR - CBC with Differential/Platelet  4.  Hypothyroidism, unspecified type - levothyroxine (SYNTHROID) 75 MCG tablet; Take 1 tablet (75 mcg total) by mouth daily.  Dispense: 90 tablet; Refill: 2 - CMP14+EGFR - CBC with Differential/Platelet - TSH  5. Well adult exam - pravastatin (PRAVACHOL) 40 MG tablet; Take 1 tablet (40 mg total) by mouth daily.  Dispense: 90 tablet; Refill: 1 - CMP14+EGFR - CBC with Differential/Platelet  6. Malignant neoplasm of lower-inner quadrant of right breast of female, estrogen receptor positive ([place]) - CMP14+EGFR - CBC with Differential/Platelet  7. Anal sphincter incompetence - CMP14+EGFR - CBC with Differential/Platelet  8. Hyperlipidemia, unspecified hyperlipidemia type - CMP14+EGFR - CBC with Differential/Platelet  9. Chronic midline thoracic back pain - CMP14+EGFR - CBC with Differential/Platelet  10. Lumbar pain - CMP14+EGFR - CBC with Differential/Platelet  11. COPD exacerbation ([place]) - CMP14+EGFR - CBC with Differential/Platelet   Labs pending Health Maintenance reviewed Diet and exercise encouraged  Follow up plan:  6 months    Evelina Dun, FNP

## 2020-01-04 NOTE — Telephone Encounter (Signed)
Medications have been cancelled and sent to correct pharmacy. Patient aware

## 2020-01-04 NOTE — Telephone Encounter (Signed)
Pt would like to speak to someone about her medications, she stated that all of her medications were sent to the wrong pharmacies

## 2020-01-04 NOTE — Addendum Note (Signed)
Addended by: Evelina Dun A on: 01/04/2020 08:35 AM   Modules accepted: Orders

## 2020-01-05 LAB — CMP14+EGFR
ALT: 15 IU/L (ref 0–32)
AST: 21 IU/L (ref 0–40)
Albumin/Globulin Ratio: 1.7 (ref 1.2–2.2)
Albumin: 4.4 g/dL (ref 3.8–4.8)
Alkaline Phosphatase: 98 IU/L (ref 44–121)
BUN/Creatinine Ratio: 19 (ref 12–28)
BUN: 15 mg/dL (ref 8–27)
Bilirubin Total: 0.4 mg/dL (ref 0.0–1.2)
CO2: 23 mmol/L (ref 20–29)
Calcium: 9.2 mg/dL (ref 8.7–10.3)
Chloride: 104 mmol/L (ref 96–106)
Creatinine, Ser: 0.81 mg/dL (ref 0.57–1.00)
GFR calc Af Amer: 86 mL/min/{1.73_m2} (ref >59)
GFR calc non Af Amer: 74 mL/min/{1.73_m2} (ref >59)
Globulin, Total: 2.6 g/dL (ref 1.5–4.5)
Glucose: 90 mg/dL (ref 65–99)
Potassium: 4.6 mmol/L (ref 3.5–5.2)
Sodium: 141 mmol/L (ref 134–144)
Total Protein: 7 g/dL (ref 6.0–8.5)

## 2020-01-05 LAB — CBC WITH DIFFERENTIAL/PLATELET
Basophils Absolute: 0 10*3/uL (ref 0.0–0.2)
Basos: 1 %
EOS (ABSOLUTE): 0.2 10*3/uL (ref 0.0–0.4)
Eos: 5 %
Hematocrit: 40.6 % (ref 34.0–46.6)
Hemoglobin: 13.5 g/dL (ref 11.1–15.9)
Immature Grans (Abs): 0 10*3/uL (ref 0.0–0.1)
Immature Granulocytes: 1 %
Lymphocytes Absolute: 2 10*3/uL (ref 0.7–3.1)
Lymphs: 45 %
MCH: 30.4 pg (ref 26.6–33.0)
MCHC: 33.3 g/dL (ref 31.5–35.7)
MCV: 91 fL (ref 79–97)
Monocytes Absolute: 0.6 10*3/uL (ref 0.1–0.9)
Monocytes: 14 %
Neutrophils Absolute: 1.5 10*3/uL (ref 1.4–7.0)
Neutrophils: 34 %
Platelets: 322 10*3/uL (ref 150–450)
RBC: 4.44 x10E6/uL (ref 3.77–5.28)
RDW: 12.3 % (ref 11.7–15.4)
WBC: 4.3 10*3/uL (ref 3.4–10.8)

## 2020-01-05 LAB — TSH: TSH: 16.4 u[IU]/mL — ABNORMAL HIGH (ref 0.450–4.500)

## 2020-01-07 ENCOUNTER — Other Ambulatory Visit: Payer: Self-pay | Admitting: Family

## 2020-01-07 ENCOUNTER — Other Ambulatory Visit: Payer: Self-pay

## 2020-01-07 ENCOUNTER — Ambulatory Visit (INDEPENDENT_AMBULATORY_CARE_PROVIDER_SITE_OTHER): Payer: Medicare HMO | Admitting: *Deleted

## 2020-01-07 DIAGNOSIS — E538 Deficiency of other specified B group vitamins: Secondary | ICD-10-CM

## 2020-01-07 MED ORDER — LEVOTHYROXINE SODIUM 88 MCG PO TABS
88.0000 ug | ORAL_TABLET | Freq: Every day | ORAL | 11 refills | Status: DC
Start: 2020-01-07 — End: 2020-01-08

## 2020-01-07 NOTE — Progress Notes (Signed)
B12 inj given and tolerated well  °

## 2020-01-08 ENCOUNTER — Telehealth: Payer: Self-pay

## 2020-01-08 MED ORDER — LEVOTHYROXINE SODIUM 88 MCG PO TABS: 88.0000 ug | ORAL_TABLET | Freq: Every day | ORAL | 1 refills | Status: DC

## 2020-01-08 NOTE — Telephone Encounter (Signed)
Rx sent- patient aware.  

## 2020-01-08 NOTE — Telephone Encounter (Signed)
  Prescription Request  01/08/2020  What is the name of the medication or equipment? Pt's thyroid was increase and she got 30 day supply at [facility]. She said it cost $9 there and [facility] is [facility]. She went ahead and picked it up but next time she [facility]  Have you contacted your pharmacy to request a refill? (if applicable)  Which [facility] you [facility]   Patient notified that their request is being sent to the clinical staff for review and that they should receive a response within 2 business days.

## 2020-01-12 ENCOUNTER — Emergency Department (HOSPITAL_COMMUNITY): Payer: Medicare HMO

## 2020-01-12 ENCOUNTER — Encounter (HOSPITAL_COMMUNITY): Payer: Self-pay | Admitting: *Deleted

## 2020-01-12 ENCOUNTER — Emergency Department (HOSPITAL_COMMUNITY)
Admission: EM | Admit: 2020-01-12 | Discharge: 2020-01-12 | Disposition: A | Discharge status: Home / Self Care | Payer: Medicare HMO | Attending: Emergency Medicine | Admitting: Emergency Medicine

## 2020-01-12 DIAGNOSIS — R059 Cough, unspecified: Secondary | ICD-10-CM | POA: Diagnosis not present

## 2020-01-12 DIAGNOSIS — J189 Pneumonia, unspecified organism: Secondary | ICD-10-CM | POA: Diagnosis not present

## 2020-01-12 DIAGNOSIS — J449 Chronic obstructive pulmonary disease, unspecified: Secondary | ICD-10-CM | POA: Insufficient documentation

## 2020-01-12 DIAGNOSIS — Z853 Personal history of malignant neoplasm of breast: Secondary | ICD-10-CM | POA: Diagnosis not present

## 2020-01-12 DIAGNOSIS — F17210 Nicotine dependence, cigarettes, uncomplicated: Secondary | ICD-10-CM | POA: Insufficient documentation

## 2020-01-12 DIAGNOSIS — E039 Hypothyroidism, unspecified: Secondary | ICD-10-CM | POA: Diagnosis not present

## 2020-01-12 DIAGNOSIS — R69 Illness, unspecified: Secondary | ICD-10-CM | POA: Diagnosis not present

## 2020-01-12 DIAGNOSIS — R509 Fever, unspecified: Secondary | ICD-10-CM | POA: Diagnosis not present

## 2020-01-12 DIAGNOSIS — Z20822 Contact with and (suspected) exposure to covid-19: Secondary | ICD-10-CM | POA: Insufficient documentation

## 2020-01-12 LAB — RESP PANEL BY RT-PCR (FLU A&B, COVID) ARPGX2
Influenza A by PCR: NEGATIVE
Influenza B by PCR: NEGATIVE
SARS Coronavirus 2 by RT PCR: NEGATIVE

## 2020-01-12 MED ORDER — ALBUTEROL SULFATE HFA 108 (90 BASE) MCG/ACT IN AERS
2.0000 | INHALATION_SPRAY | Freq: Once | RESPIRATORY_TRACT | Status: AC
  Administered 2020-01-12: 2 via RESPIRATORY_TRACT
  Filled 2020-01-12: qty 6.7

## 2020-01-12 MED ORDER — LEVOFLOXACIN 750 MG PO TABS: 750.0000 mg | ORAL_TABLET | Freq: Every day | ORAL | 0 refills | Status: DC

## 2020-01-12 MED ORDER — LEVOFLOXACIN 750 MG PO TABS
750.0000 mg | ORAL_TABLET | Freq: Once | ORAL | Status: AC
  Administered 2020-01-12: 750 mg via ORAL
  Filled 2020-01-12: qty 1

## 2020-01-12 NOTE — ED Triage Notes (Signed)
Cough with fever, concerned she may have pneumonia

## 2020-01-12 NOTE — Consult Note (Signed)
Negative covid  Doesn't trust the result as it was negative   Now believes she may have pneumonia   Here for tx

## 2020-01-12 NOTE — ED Provider Notes (Signed)
[facility] EMERGENCY DEPARTMENT Provider Note   CSN: [id number] Arrival date & time: 01/12/20  0944     History Chief Complaint  Patient presents with  . Cough  . Fever    Cough     Jaclyn Schmidt is a 69 y.o. female.  HPI 69 year old female presents with concern for pneumonia.  Last week she had cough and went to PCP who gave her steroids.  Patient got worse a couple days ago and has had fever up to 101, worsening cough that is now nonproductive, and some shortness of breath and wheezing.  Has a history of COPD and smokes. Recent contact was recently diagnosed with pneumonia.  Past Medical History:  Diagnosis Date  . Anxiety   . Breast cancer ([place]) 05/2018   Right Breast Cancer  . Cancer ([place]) 04/2018   right breast cancer  . Cholelithiasis   . Depression   . GERD (gastroesophageal reflux disease)   . History of kidney stones   . Hyperlipidemia   . Hypothyroidism   . Irritable bowel syndrome   . Personal history of radiation therapy 2020   Right Breast Cancer  . Pneumonia   . Thyroid disease   . Vitamin D deficiency     Patient Active Problem List   Diagnosis Date Noted  . Hyperlipidemia 01/04/2020  . COPD exacerbation ([place]) 01/04/2020  . Left tennis elbow 11/15/2019  . Impingement syndrome of right shoulder 08/13/2019  . Malignant neoplasm of lower-inner quadrant of right breast of female, estrogen receptor positive ([place]) 04/26/2018  . Vertigo 09/05/2017  . Hypothyroidism 07/18/2017  . Osteopenia 03/09/2017  . Anal sphincter incompetence 12/29/2016  . Depression, recurrent ([place]) 12/29/2016  . Chronic midline thoracic back pain 09/21/2016  . Lumbar pain 09/21/2016  . Cholelithiasis 06/03/2011    Past Surgical History:  Procedure Laterality Date  . ABDOMINAL HYSTERECTOMY  [id number]  . ANTERIOR CERVICAL DECOMP/DISCECTOMY FUSION N/A 01/21/2017   Procedure: C3-4, C4-5 ANTERIOR CERVICAL DISCECTOMY AND FUSION, ALLOGRAFT, PLATE;  Surgeon: [name], MD;   Location: [place];  Service: Orthopedics;  Laterality: N/A;  . APPENDECTOMY  1996  . BREAST LUMPECTOMY Right 05/30/2018  . BREAST LUMPECTOMY WITH RADIOACTIVE SEED AND SENTINEL LYMPH NODE BIOPSY Right 05/30/2018   Procedure: RIGHT BREAST LUMPECTOMY WITH RADIOACTIVE SEED AND RIGHT AXILLARY SENTINEL LYMPH NODE BIOPSY;  Surgeon: Alphonsa Overall, MD;  Location: [place];  Service: General;  Laterality: Right;  . BREAST MASS EXCISION  1997   left  . CHOLECYSTECTOMY  07/15/2011   Procedure: LAPAROSCOPIC CHOLECYSTECTOMY WITH INTRAOPERATIVE CHOLANGIOGRAM;  Surgeon: Shann Medal, MD;  Location: [facility];  Service: General;  Laterality: N/A;  Cholecystectomy with IOC  . TOE FUSION    . TUBAL LIGATION  1980     OB History   No obstetric history on file.     Family History  Problem Relation Age of Onset  . Cancer Mother        stomach  . Heart disease Father   . Cancer Maternal Aunt        ovarian  . Cancer Paternal Aunt        breast  . Diabetes Brother   . Hypertension Brother     Social History   Tobacco Use  . Smoking status: Current Some Day Smoker    Packs/day: 0.25    Years: 36.00    Pack years: 9.00    Types: Cigarettes    Start date: 09/30/1973    Last attempt  to quit: 04/26/2018    Years since quitting: 1.7  . Smokeless tobacco: Never Used  . Tobacco comment: a pack per month  Vaping Use  . Vaping Use: Never used  Substance Use Topics  . Alcohol use: No    Alcohol/week: 0.0 standard drinks  . Drug use: No    Home Medications Prior to Admission medications   Medication Sig Start Date End Date Taking? Authorizing Provider  ALPRAZolam Duanne Moron) 0.5 MG tablet Take 1 tablet (0.5 mg total) by mouth at bedtime as needed for anxiety. 01/04/20   Evelina Dun A, FNP  BIOTIN PO Take 2 tablets by mouth daily.    [provider]  calcium carbonate (OSCAL) 1500 (600 Ca) MG TABS tablet Take by mouth 2 (two) times daily with a meal.    [provider]   cetirizine (ZYRTEC) 10 MG tablet Take 10 mg by mouth daily.    [provider]  Cholecalciferol (VITAMIN D3 PO) Take 4,000 Int'l Units by mouth daily.    [provider]  fluticasone (FLONASE) 50 MCG/ACT nasal spray Place 2 sprays into both nostrils daily as needed (FOR ALLERGIES.). 01/09/19   [name], PA-C  gabapentin (NEURONTIN) 300 MG capsule Take 600 mg in AM and 600 mg in the evening 01/04/20   Evelina Dun A, FNP  letrozole [facility]) 2.5 MG tablet Take 2.5 mg by mouth daily. 12/31/18   [provider]  levofloxacin (LEVAQUIN) 750 MG tablet Take 1 tablet (750 mg total) by mouth daily. 01/13/20   Sherwood Gambler, MD  levothyroxine (SYNTHROID) 88 MCG tablet Take 1 tablet (88 mcg total) by mouth daily. 01/08/20 01/07/21  Sharion Balloon, FNP  meclizine (ANTIVERT) 25 MG tablet Take 1 tablet (25 mg total) by mouth 3 (three) times daily as needed for dizziness. 01/04/20   Sharion Balloon, FNP  meloxicam (MOBIC) 15 MG tablet Take 1 tablet (15 mg total) by mouth daily. 01/04/20   Sharion Balloon, FNP  pravastatin (PRAVACHOL) 40 MG tablet Take 1 tablet (40 mg total) by mouth daily. 01/04/20   Sharion Balloon, FNP  predniSONE (STERAPRED UNI-PAK 21 TAB) 10 MG (21) TBPK tablet Use as directed 01/04/20   Evelina Dun A, FNP  sertraline (ZOLOFT) 50 MG tablet Take 1 tablet (50 mg total) by mouth daily. 01/04/20   Sharion Balloon, FNP    Allergies    Penicillins and Doxycycline  Review of Systems   Review of Systems  Constitutional: Positive for fever.  Respiratory: Positive for cough, shortness of breath and wheezing.   All other systems reviewed and are negative.   Physical Exam Updated Vital Signs BP 119/80   Pulse 71   Temp 98 F (36.7 C)   Resp 20   Ht 5\' 3"  (1.6 m)   Wt 65.8 kg   SpO2 96%   BMI 25.69 kg/m   Physical Exam Vitals and nursing note reviewed.  Constitutional:      General: She is not in acute distress.    Appearance: She is  well-developed and well-nourished. She is not ill-appearing or diaphoretic.  HENT:     Head: Normocephalic and atraumatic.     Right Ear: External ear normal.     Left Ear: External ear normal.     Nose: Nose normal.  Eyes:     General:        Right eye: No discharge.        Left eye: No discharge.  Cardiovascular:  Rate and Rhythm: Normal rate and regular rhythm.     Heart sounds: Normal heart sounds.  Pulmonary:     Effort: Pulmonary effort is normal.     Breath sounds: Normal breath sounds.     Comments: Mildly decreased BS at bases Abdominal:     Palpations: Abdomen is soft.     Tenderness: There is no abdominal tenderness.  Skin:    General: Skin is warm and dry.  Neurological:     Mental Status: She is alert.  Psychiatric:        Mood and Affect: Mood is not anxious.     ED Results / Procedures / Treatments   Labs (all labs ordered are listed, but only abnormal results are displayed) Labs Reviewed  RESP PANEL BY RT-PCR (FLU A&B, COVID) [id number]    EKG None  Radiology DG Chest Portable 1 View  Result Date: 01/12/2020 CLINICAL DATA:  Cough, fever EXAM: PORTABLE CHEST 1 VIEW COMPARISON:  01/31/2018 FINDINGS: Heart size is upper limits of normal. Patchy right lower lobe airspace opacity. Chronic mildly prominent interstitial markings throughout both lungs. No pleural effusion or pneumothorax. IMPRESSION: 1. Patchy right lower lobe airspace opacity suspicious for pneumonia. 2. Chronic mildly prominent interstitial markings throughout both lungs. Electronically Signed   By: Davina Poke D.O.   On: 01/12/2020 11:39    Procedures Procedures (including critical care time)  Medications Ordered in ED Medications  albuterol (VENTOLIN HFA) 108 (90 Base) MCG/ACT inhaler 2 puff (2 puffs Inhalation Given 01/12/20 1154)  levofloxacin (LEVAQUIN) tablet 750 mg (750 mg Oral Given 01/12/20 1209)    ED Course  I have reviewed the triage vital signs and the nursing  notes.  Pertinent labs & imaging results that were available during my care of the patient were reviewed by me and considered in my medical decision making (see chart for details).    MDM Rules/Calculators/A&P                          Patient is well-appearing.  No wheezing.  Chest x-ray is concerning for pneumonia.  Covid testing is negative.  She has allergies to penicillin (questionable) as well as doxycycline.  Given this as well as her medical comorbidities I think Levaquin will be the best treatment for this community-acquired pneumonia.  Given a dose now and sent the rest of the prescription to the pharmacy.  She will be given an albuterol inhaler to use.  She otherwise has stable vital signs and appears stable for outpatient treatment with return precautions. Final Clinical Impression(s) / ED Diagnoses Final diagnoses:  Community acquired pneumonia of right lower lobe of lung    Rx / DC Orders ED Discharge Orders         Ordered    levofloxacin (LEVAQUIN) 750 MG tablet  Daily        01/12/20 1158           Sherwood Gambler, MD 01/12/20 1220

## 2020-01-12 NOTE — ED Notes (Signed)
Education  Pt may have virus and since smokes  Research suggests that she could have cough up to or more than 30 days if virus

## 2020-01-12 NOTE — Discharge Instructions (Signed)
If you develop high fever, severe cough or cough with blood, trouble breathing, severe headache, neck pain/stiffness, vomiting, or any other new/concerning symptoms then return to the ER for evaluation  

## 2020-01-21 ENCOUNTER — Ambulatory Visit: Payer: Medicare HMO | Admitting: Family

## 2020-01-22 ENCOUNTER — Encounter: Payer: Self-pay | Admitting: Family

## 2020-01-22 ENCOUNTER — Other Ambulatory Visit: Payer: Self-pay

## 2020-01-22 ENCOUNTER — Other Ambulatory Visit: Payer: Self-pay | Admitting: *Deleted

## 2020-01-22 ENCOUNTER — Ambulatory Visit (INDEPENDENT_AMBULATORY_CARE_PROVIDER_SITE_OTHER): Payer: Medicare HMO | Admitting: Family

## 2020-01-22 ENCOUNTER — Ambulatory Visit (INDEPENDENT_AMBULATORY_CARE_PROVIDER_SITE_OTHER): Payer: Medicare HMO

## 2020-01-22 VITALS — BP 120/67 | HR 82 | Temp 96.9°F | Ht 63.0 in | Wt 148.2 lb

## 2020-01-22 DIAGNOSIS — Z0389 Encounter for observation for other suspected diseases and conditions ruled out: Secondary | ICD-10-CM | POA: Diagnosis not present

## 2020-01-22 DIAGNOSIS — R69 Illness, unspecified: Secondary | ICD-10-CM | POA: Diagnosis not present

## 2020-01-22 DIAGNOSIS — J449 Chronic obstructive pulmonary disease, unspecified: Secondary | ICD-10-CM | POA: Diagnosis not present

## 2020-01-22 DIAGNOSIS — F17200 Nicotine dependence, unspecified, uncomplicated: Secondary | ICD-10-CM

## 2020-01-22 DIAGNOSIS — Z09 Encounter for follow-up examination after completed treatment for conditions other than malignant neoplasm: Secondary | ICD-10-CM | POA: Diagnosis not present

## 2020-01-22 DIAGNOSIS — J189 Pneumonia, unspecified organism: Secondary | ICD-10-CM

## 2020-01-22 MED ORDER — FLUTICASONE PROPIONATE 50 MCG/ACT NA SUSP: 2.0000 | Freq: Every day | NASAL | 1 refills | Status: DC | PRN

## 2020-01-22 NOTE — Patient Instructions (Signed)
Community-Acquired Pneumonia, Adult Pneumonia is a type of lung infection that causes swelling in the airways of the lungs. Mucus and fluid may also build up inside the airways. This may cause coughing and difficulty breathing. There are different types of pneumonia. One type can develop while a person is in a hospital. A different type is called community-acquired pneumonia. It develops in people who are not, and have not recently been, in the hospital or another type of health care facility. What are the causes? This condition may be caused by:  Viruses. This is the most common cause of pneumonia.  Bacteria. Community-acquired pneumonia is often caused by Streptococcus pneumoniae bacteria. These bacteria are often passed from one person to another by breathing in droplets from the cough or sneeze of an infected person.  Fungi. This is the least common cause of pneumonia. What increases the risk? The following factors may make you more likely to develop this condition:  Having a chronic disease, such as chronic obstructive pulmonary disease (COPD), asthma, congestive heart failure, cystic fibrosis, diabetes, or kidney disease.  Having early-stage or late-stage HIV.  Having sickle cell disease.  Having had your spleen removed (splenectomy).  Having poor dental hygiene.  Having a medical condition that increases the risk of breathing in (aspirating) secretions from your own mouth and nose.  Having a weakened body defense system (immune system).  Being a smoker.  Traveling to areas where pneumonia-causing germs commonly exist.  Being around animal habitats or animals that have pneumonia-causing germs, including birds, bats, rabbits, cats, and farm animals. What are the signs or symptoms? Symptoms of this condition include:  A dry cough.  A wet (productive) cough.  Fever.  Sweating.  Chest pain, especially when breathing deeply or coughing.  Rapid breathing or difficulty  breathing.  Shortness of breath.  Shaking chills.  Fatigue.  Muscle aches. How is this diagnosed? This condition may be diagnosed based on:  Your medical history.  A physical exam. You may also have tests, including:  Chest X-rays.  Tests of your blood oxygen level and other blood gases.  Tests on blood, mucus (sputum), fluid around your lungs (pleural fluid), and urine. If your pneumonia is severe, other tests may be done to find the exact cause of your illness. How is this treated? Treatment for this condition depends on many factors, such as the cause of your pneumonia, the medicines you take, and other medical conditions that you have. For most adults, treatment and recovery from pneumonia may occur at home. In some cases, treatment must happen in a hospital. Treatment may include:  Medicines that are given by mouth or through an IV, including: ? Antibiotic medicines, if the pneumonia was caused by bacteria. ? Antiviral medicines, if the pneumonia was caused by a virus.  Being given extra oxygen.  Respiratory therapy. Although rare, treating severe pneumonia may include:  Using a machine to help you breathe (mechanical ventilation). This is done if you are not breathing well on your own and you cannot maintain a safe blood oxygen level.  Thoracentesis. This is a procedure to remove fluid from around one lung or both lungs to help you breathe better. Follow these instructions at home:  Medicines  Take over-the-counter and prescription medicines only as told by your health care provider. ? Only take cough medicine if you are losing sleep. Be aware that cough medicine can prevent your body's natural ability to remove mucus from your lungs.  If you were prescribed an antibiotic   medicine, take it as told by your health care provider. Do not stop taking the antibiotic even if you start to feel better. General instructions  Sleep in a semi-upright position at night. Try  sleeping in a reclining chair, or place a few pillows under your head.  Rest as needed and get at least 8 hours of sleep each night.  Drink enough water to keep your urine pale yellow. This will help to thin out mucus secretions in your lungs.  Eat a healthy diet that includes plenty of vegetables, fruits, whole grains, low-fat dairy products, and lean protein.  Do not use any products that contain nicotine or tobacco, such as cigarettes, e-cigarettes, and chewing tobacco. If you need help quitting, ask your health care provider.  Keep all follow-up visits as told by your health care provider. This is important. How is this prevented? You can lower your risk of developing community-acquired pneumonia by:  Getting a pneumococcal vaccine. There are different types and schedules of pneumococcal vaccines. Ask your health care provider which option is best for you. Consider getting the vaccine if: ? You are older than 70 years of age. ? You are older than 70 years of age and are undergoing cancer treatment, have chronic lung disease, or have other medical conditions that affect your immune system. Ask your health care provider if this applies to you.  Getting an influenza vaccine every year. Ask your health care provider which type of vaccine is best for you.  Getting regular checkups from your dentist.  Washing your hands often. If soap and water are not available, use hand sanitizer. Contact a health care provider if:  You have a fever.  You are losing sleep because you cannot control your cough with cough medicine. Get help right away if:  You have worsening shortness of breath.  You have increased chest pain.  Your sickness becomes worse, especially if you are an older adult or have a weakened immune system.  You cough up blood. Summary  Pneumonia is an infection of the lungs.  Community-acquired pneumonia develops in people who have not been in the hospital. It can be caused  by bacteria, viruses, or fungi.  This condition may be treated with antibiotics or antiviral medicines.  Severe cases may require hospitalization, mechanical ventilation, and other procedures to drain fluid from the lungs. This information is not intended to replace advice given to you by your health care provider. Make sure you discuss any questions you have with your health care provider. Document Revised: 09/01/2017 Document Reviewed: 09/01/2017 Elsevier Patient Education  2020 [employer]  

## 2020-01-22 NOTE — Progress Notes (Signed)
Subjective:    Patient ID: Jaclyn Schmidt, female    DOB: Sep 05, 1950, 70 y.o.   MRN: [id number]  Chief Complaint  Patient presents with  . Hospitalization Follow-up    Patient was given sample inhaler at hospital but does no know name of it    Pt presents to the office today for ED follow up. She went to the ED on 01/12/20 with fever and cough and was diagnosed with pneumonia. She was given Levofloxacin 750 mg daily. She was negative for COVID.   She reports her cough is improved, but feels fatigue.  Cough The cough is non-productive. Associated symptoms include ear congestion, nasal congestion and shortness of breath. Pertinent negatives include no chills, ear pain, fever, headaches, myalgias, postnasal drip, sore throat or wheezing. The symptoms are aggravated by lying down. Risk factors for lung disease include smoking/tobacco exposure. She has tried rest and OTC inhaler (levaquin) for the symptoms. The treatment provided mild relief.  Nicotine Dependence Presents for follow-up visit. Symptoms are negative for sore throat. Her urge triggers include company of smokers. The symptoms have been stable. She smokes < 1/2 a pack of cigarettes per day.      Review of Systems  Constitutional: Negative for chills and fever.  HENT: Negative for ear pain, postnasal drip and sore throat.   Respiratory: Positive for cough and shortness of breath. Negative for wheezing.   Musculoskeletal: Negative for myalgias.  Neurological: Negative for headaches.  All other systems reviewed and are negative.      Objective:   Physical Exam Vitals reviewed.  Constitutional:      General: She is not in acute distress.    Appearance: She is well-developed and well-nourished.  HENT:     Head: Normocephalic and atraumatic.     Right Ear: Tympanic membrane normal.     Left Ear: Tympanic membrane normal.     Mouth/Throat:     Mouth: Oropharynx is clear and moist.  Eyes:     Pupils: Pupils are equal,  round, and reactive to light.  Neck:     Thyroid: No thyromegaly.  Cardiovascular:     Rate and Rhythm: Normal rate and regular rhythm.     Pulses: Intact distal pulses.     Heart sounds: Normal heart sounds. No murmur heard.   Pulmonary:     Effort: Pulmonary effort is normal. No respiratory distress.     Breath sounds: Normal breath sounds. No wheezing.  Abdominal:     General: Bowel sounds are normal. There is no distension.     Palpations: Abdomen is soft.     Tenderness: There is no abdominal tenderness.  Musculoskeletal:        General: No tenderness or edema. Normal range of motion.     Cervical back: Normal range of motion and neck supple.  Skin:    General: Skin is warm and dry.  Neurological:     Mental Status: She is alert and oriented to person, place, and time.     Cranial Nerves: No cranial nerve deficit.     Deep Tendon Reflexes: Reflexes are normal and symmetric.  Psychiatric:        Mood and Affect: Mood and affect normal.        Behavior: Behavior normal.        Thought Content: Thought content normal.        Judgment: Judgment normal.       BP 120/67   Pulse 82   Temp Marland Kitchen)  96.9 F (36.1 C) (Temporal)   Ht 5\' 3"  (1.6 m)   Wt 148 lb 3.2 oz (67.2 kg)   SpO2 100%   BMI 26.25 kg/m      Assessment & Plan:  LARAMIE GELLES comes in today with chief complaint of Hospitalization Follow-up (Patient was given sample inhaler at hospital but does no know name of it )   Diagnosis and orders addressed:  1. Hospital discharge follow-up - DG Chest 2 View; Future  2. Community acquired pneumonia, unspecified laterality - DG Chest 2 View; Future  3. Chronic obstructive pulmonary disease, unspecified COPD type (HCC)  4. Current smoker   Lungs sound good on exam Symptoms improving Force fluids Report any fever, increase SOB  [name], FNP

## 2020-02-07 ENCOUNTER — Ambulatory Visit: Payer: Medicare HMO

## 2020-02-11 ENCOUNTER — Ambulatory Visit (INDEPENDENT_AMBULATORY_CARE_PROVIDER_SITE_OTHER): Payer: Medicare HMO

## 2020-02-11 ENCOUNTER — Other Ambulatory Visit: Payer: Self-pay

## 2020-02-11 DIAGNOSIS — E538 Deficiency of other specified B group vitamins: Secondary | ICD-10-CM

## 2020-02-11 NOTE — Progress Notes (Signed)
Cyanocobalamin injection given to left deltoid.  Patient tolerated well. 

## 2020-02-14 ENCOUNTER — Ambulatory Visit (INDEPENDENT_AMBULATORY_CARE_PROVIDER_SITE_OTHER): Payer: Medicare HMO

## 2020-02-14 ENCOUNTER — Other Ambulatory Visit: Payer: Self-pay

## 2020-02-14 ENCOUNTER — Encounter: Payer: Self-pay | Admitting: Orthopaedic Surgery

## 2020-02-14 ENCOUNTER — Ambulatory Visit (INDEPENDENT_AMBULATORY_CARE_PROVIDER_SITE_OTHER): Payer: Medicare HMO | Admitting: Orthopaedic Surgery

## 2020-02-14 VITALS — Ht 63.0 in | Wt 148.0 lb

## 2020-02-14 DIAGNOSIS — M79601 Pain in right arm: Secondary | ICD-10-CM

## 2020-02-14 MED ORDER — METHYLPREDNISOLONE ACETATE 40 MG/ML IJ SUSP
40.0000 mg | INTRAMUSCULAR | Status: AC | PRN
  Administered 2020-02-14: 40 mg via INTRA_ARTICULAR

## 2020-02-14 MED ORDER — LIDOCAINE HCL 1 % IJ SOLN
0.5000 mL | INTRAMUSCULAR | Status: AC | PRN
  Administered 2020-02-14: .5 mL

## 2020-02-14 MED ORDER — MELOXICAM 15 MG PO TABS: 15.0000 mg | ORAL_TABLET | Freq: Every day | ORAL | 4 refills | Status: DC

## 2020-02-14 MED ORDER — BUPIVACAINE HCL 0.25 % IJ SOLN
4.0000 mL | INTRAMUSCULAR | Status: AC | PRN
  Administered 2020-02-14: 4 mL via INTRA_ARTICULAR

## 2020-02-14 NOTE — Progress Notes (Signed)
Office Visit Note   Patient: Jaclyn Schmidt           Date of Birth: 10-Apr-1950           MRN: [id number] Visit Date: 02/14/2020              Requested by: Sharion Balloon, [address] [place] [place],  [place] [id number] PCP: Sharion Balloon, FNP   Assessment & Plan: Visit Diagnoses:  1. Right arm pain     Plan: Injection performed subacromially. We discussed problems with repetitive cortisone injections with weight gain muscle atrophy osteoporosis etc. She can continue using some intermittent heat. She requested meloxicam renewal which we sent in for her. Follow-up as needed.  Follow-Up Instructions: No follow-ups on file.   Orders:  Orders Placed This Encounter  Procedures  . Large Joint Inj: R subacromial bursa  . XR Humerus Right  . XR Elbow 2 Views Right   Meds ordered this encounter  Medications  . meloxicam (MOBIC) 15 MG tablet    Sig: Take 1 tablet (15 mg total) by mouth daily.    Dispense:  30 tablet    Refill:  4      Procedures: Large Joint Inj: R subacromial bursa on 02/14/2020 3:01 PM Indications: pain Details: 22 G 1.5 in needle  Arthrogram: No  Medications: 4 mL bupivacaine 0.25 %; 40 mg methylPREDNISolone acetate 40 MG/ML; 0.5 mL lidocaine 1 % Outcome: tolerated well, no immediate complications Procedure, treatment alternatives, risks and benefits explained, specific risks discussed. Consent was given by the patient. Immediately prior to procedure a time out was called to verify the correct patient, procedure, equipment, support staff and site/side marked as required. Patient was prepped and draped in the usual sterile fashion.       Clinical Data: No additional findings.   Subjective: Chief Complaint  Patient presents with  . Right Shoulder - Pain    HPI 70 year old female seen with right shoulder pain. States she was bringing groceries in the storm door came back hit her in the arm she did not notice any bruising but the following day  she has had significant increased pain with outstretch reaching overhead activities. She is taken some ibuprofen. She has had previous injections in her shoulder the last several months ago subacromial region for some rotator cuff tendinopathy without discrete tear is requesting a repeat injection today.  Review of Systems 14 point systems update otherwise unchanged. She has had breast cancer and radiation treatment for lymph nodes in the right side.  Objective: Vital Signs: Ht 5\' 3"  (1.6 m)   Wt 148 lb (67.1 kg)   BMI 26.22 kg/m   Physical Exam Constitutional:      Appearance: She is well-developed.  HENT:     Head: Normocephalic.     Right Ear: External ear normal.     Left Ear: External ear normal.  Eyes:     Pupils: Pupils are equal, round, and reactive to light.  Neck:     Thyroid: No thyromegaly.     Trachea: No tracheal deviation.  Cardiovascular:     Rate and Rhythm: Normal rate.  Pulmonary:     Effort: Pulmonary effort is normal.  Abdominal:     Palpations: Abdomen is soft.  Skin:    General: Skin is warm and dry.  Neurological:     Mental Status: She is alert and oriented to person, place, and time.  Psychiatric:  Mood and Affect: Mood and affect normal.        Behavior: Behavior normal.     Ortho Exam positive impingement right shoulder pain with resisted supraspinatus testing negative drop arm test. Internal and external rotation normal. She is tender along the distal humerus lateral epicondyle and proximal forearm laterally but no ecchymosis or swelling. Reflexes are 2+ and symmetrical.  Specialty Comments:  No specialty comments available.  Imaging: XR Elbow 2 Views Right  Result Date: 02/14/2020 2 view x-rays right elbow obtained and reviewed negative for acute changes no degenerative changes at the elbow. No soft tissue swelling. Impression: Normal right elbow x-rays.  XR Humerus Right  Result Date: 02/14/2020 2 view x-rays right humerus  obtained and reviewed. Shows normal shoulder joint. No soft tissue swelling. Humerus is negative for fracture. Impression: 2 view x-rays right shoulder negative for acute injury.    PMFS History: Patient Active Problem List   Diagnosis Date Noted  . Hyperlipidemia 01/04/2020  . COPD exacerbation ([place]) 01/04/2020  . Left tennis elbow 11/15/2019  . Impingement syndrome of right shoulder 08/13/2019  . Malignant neoplasm of lower-inner quadrant of right breast of female, estrogen receptor positive ([place]) 04/26/2018  . Vertigo 09/05/2017  . Hypothyroidism 07/18/2017  . Osteopenia 03/09/2017  . Anal sphincter incompetence 12/29/2016  . Depression, recurrent ([place]) 12/29/2016  . Chronic midline thoracic back pain 09/21/2016  . Lumbar pain 09/21/2016  . Cholelithiasis 06/03/2011   Past Medical History:  Diagnosis Date  . Anxiety   . Breast cancer ([place]) 05/2018   Right Breast Cancer  . Cancer ([facility]) 04/2018   right breast cancer  . Cholelithiasis   . Depression   . GERD (gastroesophageal reflux disease)   . History of kidney stones   . Hyperlipidemia   . Hypothyroidism   . Irritable bowel syndrome   . Personal history of radiation therapy 2020   Right Breast Cancer  . Pneumonia   . Thyroid disease   . Vitamin D deficiency     Family History  Problem Relation Age of Onset  . Cancer Mother        stomach  . Heart disease Father   . Cancer Maternal Aunt        ovarian  . Cancer Paternal Aunt        breast  . Diabetes Brother   . Hypertension Brother     Past Surgical History:  Procedure Laterality Date  . ABDOMINAL HYSTERECTOMY  [id number]  . ANTERIOR CERVICAL DECOMP/DISCECTOMY FUSION N/A 01/21/2017   Procedure: C3-4, C4-5 ANTERIOR CERVICAL DISCECTOMY AND FUSION, ALLOGRAFT, PLATE;  Surgeon: [name], MD;  Location: [facility];  Service: Orthopedics;  Laterality: N/A;  . APPENDECTOMY  1996  . BREAST LUMPECTOMY Right 05/30/2018  . BREAST LUMPECTOMY WITH RADIOACTIVE SEED AND  SENTINEL LYMPH NODE BIOPSY Right 05/30/2018   Procedure: RIGHT BREAST LUMPECTOMY WITH RADIOACTIVE SEED AND RIGHT AXILLARY SENTINEL LYMPH NODE BIOPSY;  Surgeon: Alphonsa Overall, MD;  Location: [facility];  Service: General;  Laterality: Right;  . BREAST MASS EXCISION  1997   left  . CHOLECYSTECTOMY  07/15/2011   Procedure: LAPAROSCOPIC CHOLECYSTECTOMY WITH INTRAOPERATIVE CHOLANGIOGRAM;  Surgeon: Shann Medal, MD;  Location: [facility];  Service: General;  Laterality: N/A;  Cholecystectomy with IOC  . TOE FUSION    . TUBAL LIGATION  1980   Social History   Occupational History  . Occupation: Retired     Fish farm manager: [employer]  . Occupation: full time  Comment: sitter  Tobacco Use  . Smoking status: Current Some Day Smoker    Packs/day: 0.25    Years: 36.00    Pack years: 9.00    Types: Cigarettes    Start date: 09/30/1973    Last attempt to quit: 04/26/2018    Years since quitting: 1.8  . Smokeless tobacco: Never Used  . Tobacco comment: a pack per month  Vaping Use  . Vaping Use: Never used  Substance and Sexual Activity  . Alcohol use: No    Alcohol/week: 0.0 standard drinks  . Drug use: No  . Sexual activity: Not on file

## 2020-02-18 DIAGNOSIS — Z87891 Personal history of nicotine dependence: Secondary | ICD-10-CM | POA: Diagnosis not present

## 2020-02-18 DIAGNOSIS — Z170 Estrogen receptor positive status [ER+]: Secondary | ICD-10-CM | POA: Diagnosis not present

## 2020-02-18 DIAGNOSIS — Z79899 Other long term (current) drug therapy: Secondary | ICD-10-CM | POA: Diagnosis not present

## 2020-02-18 DIAGNOSIS — C50511 Malignant neoplasm of lower-outer quadrant of right female breast: Secondary | ICD-10-CM | POA: Diagnosis not present

## 2020-02-18 DIAGNOSIS — M25511 Pain in right shoulder: Secondary | ICD-10-CM | POA: Diagnosis not present

## 2020-02-18 DIAGNOSIS — Z79811 Long term (current) use of aromatase inhibitors: Secondary | ICD-10-CM | POA: Diagnosis not present

## 2020-02-18 DIAGNOSIS — E538 Deficiency of other specified B group vitamins: Secondary | ICD-10-CM | POA: Diagnosis not present

## 2020-02-18 DIAGNOSIS — M8580 Other specified disorders of bone density and structure, unspecified site: Secondary | ICD-10-CM | POA: Diagnosis not present

## 2020-02-19 DIAGNOSIS — S90851A Superficial foreign body, right foot, initial encounter: Secondary | ICD-10-CM | POA: Diagnosis not present

## 2020-03-06 ENCOUNTER — Ambulatory Visit (INDEPENDENT_AMBULATORY_CARE_PROVIDER_SITE_OTHER): Payer: Medicare HMO | Admitting: Family

## 2020-03-06 ENCOUNTER — Encounter: Payer: Self-pay | Admitting: Family

## 2020-03-06 ENCOUNTER — Other Ambulatory Visit: Payer: Self-pay

## 2020-03-06 VITALS — BP 105/68 | HR 105 | Temp 97.8°F | Ht 63.0 in | Wt 147.6 lb

## 2020-03-06 DIAGNOSIS — F339 Major depressive disorder, recurrent, unspecified: Secondary | ICD-10-CM

## 2020-03-06 DIAGNOSIS — C50311 Malignant neoplasm of lower-inner quadrant of right female breast: Secondary | ICD-10-CM

## 2020-03-06 DIAGNOSIS — G8929 Other chronic pain: Secondary | ICD-10-CM

## 2020-03-06 DIAGNOSIS — E538 Deficiency of other specified B group vitamins: Secondary | ICD-10-CM

## 2020-03-06 DIAGNOSIS — Z7689 Persons encountering health services in other specified circumstances: Secondary | ICD-10-CM | POA: Diagnosis not present

## 2020-03-06 DIAGNOSIS — M546 Pain in thoracic spine: Secondary | ICD-10-CM | POA: Diagnosis not present

## 2020-03-06 DIAGNOSIS — Z170 Estrogen receptor positive status [ER+]: Secondary | ICD-10-CM | POA: Diagnosis not present

## 2020-03-06 DIAGNOSIS — E039 Hypothyroidism, unspecified: Secondary | ICD-10-CM | POA: Diagnosis not present

## 2020-03-06 DIAGNOSIS — E785 Hyperlipidemia, unspecified: Secondary | ICD-10-CM

## 2020-03-06 DIAGNOSIS — J441 Chronic obstructive pulmonary disease with (acute) exacerbation: Secondary | ICD-10-CM | POA: Diagnosis not present

## 2020-03-06 DIAGNOSIS — R69 Illness, unspecified: Secondary | ICD-10-CM | POA: Diagnosis not present

## 2020-03-06 NOTE — Patient Instructions (Signed)
Hypothyroidism  Hypothyroidism is when the thyroid gland does not make enough of certain hormones (it is underactive). The thyroid gland is a small gland located in the lower front part of the neck, just in front of the windpipe (trachea). This gland makes hormones that help control how the body uses food for energy (metabolism) as well as how the heart and brain function. These hormones also play a role in keeping your bones strong. When the thyroid is underactive, it produces too little of the hormones thyroxine (T4) and triiodothyronine (T3). What are the causes? This condition may be caused by:  Hashimoto's disease. This is a disease in which the body's disease-fighting system (immune system) attacks the thyroid gland. This is the most common cause.  Viral infections.  Pregnancy.  Certain medicines.  Birth defects.  Past radiation treatments to the head or neck for cancer.  Past treatment with radioactive iodine.  Past exposure to radiation in the environment.  Past surgical removal of part or all of the thyroid.  Problems with a gland in the center of the brain (pituitary gland).  Lack of enough iodine in the diet. What increases the risk? You are more likely to develop this condition if:  You are female.  You have a family history of thyroid conditions.  You use a medicine called lithium.  You take medicines that affect the immune system (immunosuppressants). What are the signs or symptoms? Symptoms of this condition include:  Feeling as though you have no energy (lethargy).  Not being able to tolerate cold.  Weight gain that is not explained by a change in diet or exercise habits.  Lack of appetite.  Dry skin.  Coarse hair.  Menstrual irregularity.  Slowing of thought processes.  Constipation.  Sadness or depression. How is this diagnosed? This condition may be diagnosed based on:  Your symptoms, your medical history, and a physical exam.  Blood  tests. You may also have imaging tests, such as an ultrasound or MRI. How is this treated? This condition is treated with medicine that replaces the thyroid hormones that your body does not make. After you begin treatment, it may take several weeks for symptoms to go away. Follow these instructions at home:  Take over-the-counter and prescription medicines only as told by your health care provider.  If you start taking any new medicines, tell your health care provider.  Keep all follow-up visits as told by your health care provider. This is important. ? As your condition improves, your dosage of thyroid hormone medicine may change. ? You will need to have blood tests regularly so that your health care provider can monitor your condition. Contact a health care provider if:  Your symptoms do not get better with treatment.  You are taking thyroid hormone replacement medicine and you: ? Sweat a lot. ? Have tremors. ? Feel anxious. ? Lose weight rapidly. ? Cannot tolerate heat. ? Have emotional swings. ? Have diarrhea. ? Feel weak. Get help right away if you have:  Chest pain.  An irregular heartbeat.  A rapid heartbeat.  Difficulty breathing. Summary  Hypothyroidism is when the thyroid gland does not make enough of certain hormones (it is underactive).  When the thyroid is underactive, it produces too little of the hormones thyroxine (T4) and triiodothyronine (T3).  The most common cause is Hashimoto's disease, a disease in which the body's disease-fighting system (immune system) attacks the thyroid gland. The condition can also be caused by viral infections, medicine, pregnancy, or   past radiation treatment to the head or neck.  Symptoms may include weight gain, dry skin, constipation, feeling as though you do not have energy, and not being able to tolerate cold.  This condition is treated with medicine to replace the thyroid hormones that your body does not make. This  information is not intended to replace advice given to you by your health care provider. Make sure you discuss any questions you have with your health care provider. Document Revised: 10/05/2019 Document Reviewed: 09/20/2019 Elsevier Patient Education  2021 Elsevier Inc.  

## 2020-03-06 NOTE — Progress Notes (Signed)
Subjective:    Patient ID: Jaclyn Schmidt, female    DOB: 1950/10/02, 70 y.o.   MRN: [id number]  Chief Complaint  Patient presents with  . Hypothyroidism   PT presents to the office today for chronic follow up. She is followed by Oncologists every 3-4 months for Right Breast cancer. She completed radiation 2020. States she is doing well at this time. She has anal sphincter incompetence and taking  Metamucil. She does pelvic floor exercises daily.   She also has Vit B 12 deficiency and gets monthly injections.  Thyroid Problem Presents for follow-up visit. Symptoms include constipation, diarrhea, dry skin and fatigue. Patient reports no hoarse voice. The symptoms have been stable. Her past medical history is significant for hyperlipidemia.  Hyperlipidemia This is a chronic problem. The current episode started more than 1 year ago. The problem is uncontrolled. Recent lipid tests were reviewed and are high. Current antihyperlipidemic treatment includes statins. The current treatment provides moderate improvement of lipids. Risk factors for coronary artery disease include dyslipidemia, hypertension and a sedentary lifestyle.  Depression        This is a chronic problem.  The current episode started more than 1 year ago.   The onset quality is gradual.   The problem occurs intermittently.  Associated symptoms include fatigue, irritable and sad.  Associated symptoms include no helplessness and no hopelessness.  Past treatments include SSRIs - Selective serotonin reuptake inhibitors.  Compliance with treatment is good.  Past medical history includes thyroid problem.   Back Pain This is a chronic problem. The current episode started more than 1 year ago. The problem occurs intermittently. The problem has been waxing and waning since onset. The pain is present in the lumbar spine. The quality of the pain is described as aching. The pain is at a severity of 10/10. The pain is moderate. The symptoms are  aggravated by twisting and bending.      Review of Systems  Constitutional: Positive for fatigue.  HENT: Negative for hoarse voice.   Gastrointestinal: Positive for constipation and diarrhea.  Musculoskeletal: Positive for back pain.  Psychiatric/Behavioral: Positive for depression.  All other systems reviewed and are negative.      Objective:   Physical Exam Vitals reviewed.  Constitutional:      General: She is irritable. She is not in acute distress.    Appearance: She is well-developed and well-nourished.  HENT:     Head: Normocephalic and atraumatic.     Right Ear: Tympanic membrane normal.     Left Ear: Tympanic membrane normal.     Mouth/Throat:     Mouth: Oropharynx is clear and moist.  Eyes:     Pupils: Pupils are equal, round, and reactive to light.  Neck:     Thyroid: No thyromegaly.  Cardiovascular:     Rate and Rhythm: Normal rate and regular rhythm.     Pulses: Intact distal pulses.     Heart sounds: Normal heart sounds. No murmur heard.   Pulmonary:     Effort: Pulmonary effort is normal. No respiratory distress.     Breath sounds: Normal breath sounds. No wheezing.  Abdominal:     General: Bowel sounds are normal. There is no distension.     Palpations: Abdomen is soft.     Tenderness: There is no abdominal tenderness.  Musculoskeletal:        General: No tenderness or edema. Normal range of motion.     Cervical back: Normal range of  motion and neck supple.  Skin:    General: Skin is warm and dry.  Neurological:     Mental Status: She is alert and oriented to person, place, and time.     Cranial Nerves: No cranial nerve deficit.     Deep Tendon Reflexes: Reflexes are normal and symmetric.  Psychiatric:        Mood and Affect: Mood and affect normal.        Behavior: Behavior normal.        Thought Content: Thought content normal.        Judgment: Judgment normal.       BP 99/71   Pulse (!) 105   Temp 97.8 F (36.6 C) (Temporal)   Ht  _0  (1.6 m)   Wt 147 lb 9.6 oz (67 kg)   BMI 26.15 kg/m      Assessment & Plan:  Jaclyn Schmidt comes in today with chief complaint of Hypothyroidism   Diagnosis and orders addressed:  1. COPD exacerbation ([place]) - AMB Referral to [facility]  2. Hypothyroidism, unspecified type - TSH - AMB Referral to [facility]  3. Chronic midline thoracic back pain - AMB Referral to [facility]  4. Depression, recurrent ([place]) - AMB Referral to [facility]  5. Hyperlipidemia, unspecified hyperlipidemia type - Lipid panel - AMB Referral to [facility]  6. Malignant neoplasm of lower-inner quadrant of right breast of female, estrogen receptor positive ([place]) - AMB Referral to [facility]  7. Vitamin B 12 deficiency - CMP14+EGFR - Anemia Profile B   Labs pending Health Maintenance reviewed Diet and exercise encouraged  Follow up plan: 6 months    Evelina Dun, FNP

## 2020-03-07 LAB — TSH: TSH: 0.127 u[IU]/mL — ABNORMAL LOW (ref 0.450–4.500)

## 2020-03-07 LAB — ANEMIA PROFILE B
Basophils Absolute: 0 10*3/uL (ref 0.0–0.2)
Basos: 1 %
EOS (ABSOLUTE): 0.1 10*3/uL (ref 0.0–0.4)
Eos: 3 %
Ferritin: 85 ng/mL (ref 15–150)
Folate: 14.4 ng/mL (ref >3.0)
Hematocrit: 40.4 % (ref 34.0–46.6)
Hemoglobin: 13.8 g/dL (ref 11.1–15.9)
Immature Grans (Abs): 0 10*3/uL (ref 0.0–0.1)
Immature Granulocytes: 0 %
Iron Saturation: 25 % (ref 15–55)
Iron: 89 ug/dL (ref 27–139)
Lymphocytes Absolute: 1.7 10*3/uL (ref 0.7–3.1)
Lymphs: 43 %
MCH: 30.1 pg (ref 26.6–33.0)
MCHC: 34.2 g/dL (ref 31.5–35.7)
MCV: 88 fL (ref 79–97)
Monocytes Absolute: 0.4 10*3/uL (ref 0.1–0.9)
Monocytes: 11 %
Neutrophils Absolute: 1.6 10*3/uL (ref 1.4–7.0)
Neutrophils: 42 %
Platelets: 300 10*3/uL (ref 150–450)
RBC: 4.58 x10E6/uL (ref 3.77–5.28)
RDW: 12 % (ref 11.7–15.4)
Retic Ct Pct: 1.3 % (ref 0.6–2.6)
Total Iron Binding Capacity: 352 ug/dL (ref 250–450)
UIBC: 263 ug/dL (ref 118–369)
Vitamin B-12: 494 pg/mL (ref 232–1245)
WBC: 3.9 10*3/uL (ref 3.4–10.8)

## 2020-03-07 LAB — LIPID PANEL
Chol/HDL Ratio: 4.4 ratio (ref 0.0–4.4)
Cholesterol, Total: 153 mg/dL (ref 100–199)
HDL: 35 mg/dL — ABNORMAL LOW (ref >39)
LDL Chol Calc (NIH): 72 mg/dL (ref 0–99)
Triglycerides: 284 mg/dL — ABNORMAL HIGH (ref 0–149)
VLDL Cholesterol Cal: 46 mg/dL — ABNORMAL HIGH (ref 5–40)

## 2020-03-07 LAB — CMP14+EGFR
ALT: 11 IU/L (ref 0–32)
AST: 17 IU/L (ref 0–40)
Albumin/Globulin Ratio: 1.9 (ref 1.2–2.2)
Albumin: 4.3 g/dL (ref 3.8–4.8)
Alkaline Phosphatase: 84 IU/L (ref 44–121)
BUN/Creatinine Ratio: 29 — ABNORMAL HIGH (ref 12–28)
BUN: 18 mg/dL (ref 8–27)
Bilirubin Total: 0.4 mg/dL (ref 0.0–1.2)
CO2: 20 mmol/L (ref 20–29)
Calcium: 9.4 mg/dL (ref 8.7–10.3)
Chloride: 101 mmol/L (ref 96–106)
Creatinine, Ser: 0.63 mg/dL (ref 0.57–1.00)
GFR calc Af Amer: 106 mL/min/{1.73_m2} (ref >59)
GFR calc non Af Amer: 92 mL/min/{1.73_m2} (ref >59)
Globulin, Total: 2.3 g/dL (ref 1.5–4.5)
Glucose: 136 mg/dL — ABNORMAL HIGH (ref 65–99)
Potassium: 4 mmol/L (ref 3.5–5.2)
Sodium: 140 mmol/L (ref 134–144)
Total Protein: 6.6 g/dL (ref 6.0–8.5)

## 2020-03-10 ENCOUNTER — Other Ambulatory Visit: Payer: Self-pay | Admitting: Family

## 2020-03-10 MED ORDER — LEVOTHYROXINE SODIUM 75 MCG PO TABS
75.0000 ug | ORAL_TABLET | Freq: Every day | ORAL | 3 refills | Status: DC
Start: 2020-03-10 — End: 2020-10-20

## 2020-03-12 LAB — SPECIMEN STATUS REPORT

## 2020-03-12 LAB — HGB A1C W/O EAG: Hgb A1c MFr Bld: 5.4 % (ref 4.8–5.6)

## 2020-03-13 ENCOUNTER — Other Ambulatory Visit: Payer: Self-pay

## 2020-03-13 ENCOUNTER — Ambulatory Visit (INDEPENDENT_AMBULATORY_CARE_PROVIDER_SITE_OTHER): Payer: Medicare HMO | Admitting: *Deleted

## 2020-03-13 DIAGNOSIS — E538 Deficiency of other specified B group vitamins: Secondary | ICD-10-CM | POA: Diagnosis not present

## 2020-03-14 ENCOUNTER — Ambulatory Visit (INDEPENDENT_AMBULATORY_CARE_PROVIDER_SITE_OTHER): Payer: Medicare HMO | Admitting: *Deleted

## 2020-03-14 VITALS — BP 105/68 | Ht 63.0 in | Wt 147.0 lb

## 2020-03-14 DIAGNOSIS — Z0000 Encounter for general adult medical examination without abnormal findings: Secondary | ICD-10-CM | POA: Diagnosis not present

## 2020-03-14 NOTE — Patient Instructions (Signed)
  Jaclyn Schmidt , Thank you for taking time to come for your Medicare Wellness Visit. I appreciate your ongoing commitment to your health goals. Please review the following plan we discussed and let me know if I can assist you in the future.   These are the goals we discussed: Goals    . Exercise 3x per week (30 min per time)    . Patient Stated     03/13/2019 AWV Goal: Exercise for General Health   Patient will verbalize understanding of the benefits of increased physical activity:  Exercising regularly is important. It will improve your overall fitness, flexibility, and endurance.  Regular exercise also will improve your overall health. It can help you control your weight, reduce stress, and improve your bone density.  Over the next year, patient will increase physical activity as tolerated with a goal of at least 150 minutes of moderate physical activity per week.   You can tell that you are exercising at a moderate intensity if your heart starts beating faster and you start breathing faster but can still hold a conversation.  Moderate-intensity exercise ideas include:  Walking 1 mile (1.6 km) in about 15 minutes  Biking  Hiking  Golfing  Dancing  Water aerobics  Patient will verbalize understanding of everyday activities that increase physical activity by providing examples like the following: ? Yard work, such as: ? Pushing a Conservation officer, nature ? Raking and bagging leaves ? Washing your car ? Pushing a stroller ? Shoveling snow ? Gardening ? Washing windows or floors  Patient will be able to explain general safety guidelines for exercising:   Before you start a new exercise program, talk with your health care provider.  Do not exercise so much that you hurt yourself, feel dizzy, or get very short of breath.  Wear comfortable clothes and wear shoes with good support.  Drink plenty of water while you exercise to prevent dehydration or heat stroke.  Work out until your  breathing and your heartbeat get faster.        This is a list of the screening recommended for you and due dates:  Health Maintenance  Topic Date Due  . Mammogram  03/19/2020  . COVID-19 Vaccine (4 - Booster for Moderna series) 06/09/2020  . Colon Cancer Screening  02/16/2021  . DEXA scan (bone density measurement)  03/29/2021  . Tetanus Vaccine  02/18/2030  . Flu Shot  Completed  .  Hepatitis C: One time screening is recommended by Center for Disease Control  (CDC) for  adults born from [birth year] through 1965.   Completed  . Pneumonia vaccines  Completed

## 2020-03-14 NOTE — Progress Notes (Signed)
MEDICARE ANNUAL WELLNESS VISIT  03/14/2020  Telephone Visit Disclaimer This Medicare AWV was conducted by telephone due to national recommendations for restrictions regarding the COVID-19 Pandemic (e.g. social distancing).  I verified, using two identifiers, that I am speaking with Jaclyn Schmidt or their authorized healthcare agent. I discussed the limitations, risks, security, and privacy concerns of performing an evaluation and management service by telephone and the potential availability of an in-person appointment in the future. The patient expressed understanding and agreed to proceed.  Location of Patient: in her home Location of Provider (nurse):  In office  Subjective:    Jaclyn Schmidt is a 70 y.o. female patient of Hawks, Theador Hawthorne, FNP who had a Medicare Annual Wellness Visit today via telephone. Jaclyn Schmidt is Retired and lives alone. she has 1 child. she reports that she is socially active and does interact with friends/family regularly. she is moderately physically active and enjoys dancing and riding her horses.  Patient Care Team: Sharion Balloon, FNP as PCP - General (Family Medicine) [name], PA-C as Referring Physician (General Practice) [name], MD (Inactive) as Consulting Physician (Gastroenterology) [name], MD as Consulting Physician (Orthopedic Surgery) [name], RN as Oncology Nurse Navigator [name], RN as Oncology Nurse Navigator [name], MD as Consulting Physician (Oncology) [name], MD as Consulting Physician (Radiation Oncology)  Advanced Directives 03/14/2020 01/12/2020 03/13/2019 09/13/2018 05/30/2018 05/24/2018 02/07/2018  Does Patient Have a Medical Advance Directive? No No No No No No No  Type of Advance Directive - - - - - - -  Would patient like information on creating a medical advance directive? No - Patient declined - No - Patient declined No - Patient declined No - Patient declined No - Patient  declined No - Patient declined  Pre-existing out of facility DNR order (yellow form or pink MOST form) - - - - - - -    Hospital Utilization Over the Past 12 Months: # of hospitalizations or ER visits: 0 # of surgeries: 0  Review of Systems    Patient reports that her overall health is unchanged compared to last year.  General health - NEG   Patient Reported Readings (BP, Pulse, CBG, Weight, etc) BP 105/68   Ht 5\' 3"  (1.6 m)   Wt 147 lb (66.7 kg)   BMI 26.04 kg/m    Pain Assessment       Current Medications & Allergies (verified) Allergies as of 03/14/2020      Reactions   Penicillins Rash, Other (See Comments)   PATIENT HAS HAD A PCN REACTION WITH IMMEDIATE RASH, FACIAL/TONGUE/THROAT SWELLING, SOB, OR LIGHTHEADEDNESS WITH HYPOTENSION:  #  #  #  YES  #  #  #   Has patient had a PCN reaction causing severe rash involving mucus membranes or skin necrosis: No Has patient had a PCN reaction that required hospitalization: No Has patient had a PCN reaction occurring within the last 10 years: No If all of the above answers are "NO", then may proceed with Cephalosporin use.   Doxycycline Rash      Medication List       Accurate as of March 14, 2020  8:30 AM. If you have any questions, ask your nurse or doctor.        ALPRAZolam 0.5 MG tablet Commonly known as: Xanax Take 1 tablet (0.5 mg total) by mouth at bedtime as needed for anxiety.   anastrozole 1 MG tablet Commonly  known as: ARIMIDEX Take by mouth.   BIOTIN PO Take 2 tablets by mouth daily.   calcium carbonate 1500 (600 Ca) MG Tabs tablet Commonly known as: OSCAL Take by mouth 2 (two) times daily with a meal.   cetirizine 10 MG tablet Commonly known as: ZYRTEC Take 10 mg by mouth daily.   fluticasone 50 MCG/ACT nasal spray Commonly known as: FLONASE Place 2 sprays into both nostrils daily as needed (FOR ALLERGIES.).   gabapentin 300 MG capsule Commonly known as: NEURONTIN Take 600 mg in AM and 600  mg in the evening   levothyroxine 75 MCG tablet Commonly known as: Synthroid Take 1 tablet (75 mcg total) by mouth daily.   meclizine 25 MG tablet Commonly known as: ANTIVERT Take 1 tablet (25 mg total) by mouth 3 (three) times daily as needed for dizziness.   meloxicam 15 MG tablet Commonly known as: MOBIC Take 1 tablet (15 mg total) by mouth daily.   pravastatin 40 MG tablet Commonly known as: PRAVACHOL Take 1 tablet (40 mg total) by mouth daily.   sertraline 50 MG tablet Commonly known as: ZOLOFT Take 1 tablet (50 mg total) by mouth daily.   VITAMIN D3 PO Take 4,000 Int'l Units by mouth daily.       History (reviewed): Past Medical History:  Diagnosis Date  . Anxiety   . Breast cancer ([place]) 05/2018   Right Breast Cancer  . Cancer ([place]) 04/2018   right breast cancer  . Cholelithiasis   . Depression   . GERD (gastroesophageal reflux disease)   . History of kidney stones   . Hyperlipidemia   . Hypothyroidism   . Irritable bowel syndrome   . Personal history of radiation therapy 2020   Right Breast Cancer  . Pneumonia   . Thyroid disease   . Vitamin D deficiency    Past Surgical History:  Procedure Laterality Date  . ABDOMINAL HYSTERECTOMY  [id number]  . ANTERIOR CERVICAL DECOMP/DISCECTOMY FUSION N/A 01/21/2017   Procedure: C3-4, C4-5 ANTERIOR CERVICAL DISCECTOMY AND FUSION, ALLOGRAFT, PLATE;  Surgeon: [name], MD;  Location: [facility];  Service: Orthopedics;  Laterality: N/A;  . APPENDECTOMY  1996  . BREAST LUMPECTOMY Right 05/30/2018  . BREAST LUMPECTOMY WITH RADIOACTIVE SEED AND SENTINEL LYMPH NODE BIOPSY Right 05/30/2018   Procedure: RIGHT BREAST LUMPECTOMY WITH RADIOACTIVE SEED AND RIGHT AXILLARY SENTINEL LYMPH NODE BIOPSY;  Surgeon: Alphonsa Overall, MD;  Location: [facility];  Service: General;  Laterality: Right;  . BREAST MASS EXCISION  1997   left  . CHOLECYSTECTOMY  07/15/2011   Procedure: LAPAROSCOPIC CHOLECYSTECTOMY WITH INTRAOPERATIVE  CHOLANGIOGRAM;  Surgeon: Shann Medal, MD;  Location: [facility];  Service: General;  Laterality: N/A;  Cholecystectomy with IOC  . TOE FUSION    . TUBAL LIGATION  1980   Family History  Problem Relation Age of Onset  . Cancer Mother        stomach  . Heart disease Father   . Cancer Maternal Aunt        ovarian  . Cancer Paternal Aunt        breast  . Diabetes Brother   . Hypertension Brother    Social History   Socioeconomic History  . Marital status: Divorced    Spouse name: Not on file  . Number of children: 1  . Years of education: 76  . Highest education level: High school graduate  Occupational History  . Occupation: Retired     Fish farm manager: [employer]  . Occupation: full time    Comment: sitter  Tobacco Use  . Smoking status: Current Some Day Smoker    Packs/day: 0.25    Years: 36.00    Pack years: 9.00    Types: Cigarettes    Start date: 09/30/1973    Last attempt to quit: 04/26/2018    Years since quitting: 1.8  . Smokeless tobacco: Never Used  . Tobacco comment: a pack per month  Vaping Use  . Vaping Use: Never used  Substance and Sexual Activity  . Alcohol use: No    Alcohol/week: 0.0 standard drinks  . Drug use: No  . Sexual activity: Not on file  Other Topics Concern  . Not on file  Social History Narrative   3 grandchildren. Enjoys dancing and going out to eat.    Social Determinants of Health   Financial Resource Strain: Not on file  Food Insecurity: Not on file  Transportation Needs: Not on file  Physical Activity: Not on file  Stress: Not on file  Social Connections: Not on file    Activities of Daily Living In your present state of health, do you have any difficulty performing the following activities: 03/14/2020  Hearing? N  Vision? Y  Comment wears contacts - rx glasses  Difficulty concentrating or making decisions? N  Walking or climbing stairs? N  Dressing or bathing? N  Doing errands, shopping? N  Preparing Food and eating ? N   Using the Toilet? N  In the past six months, have you accidently leaked urine? N  Do you have problems with loss of bowel control? N  Managing your Medications? N  Managing your Finances? N  Housekeeping or managing your Housekeeping? N  Some recent data might be hidden    Patient Education/ Literacy    Exercise Current Exercise Habits: The patient has a physically strenuous job, but has no regular exercise apart from work., Exercise limited by: None identified  Diet Patient reports consuming 2 meals a day and 1 snack(s) a day Patient reports that her primary diet is: Regular Patient reports that she does have regular access to food.   Depression Screen PHQ 2/9 Scores 03/14/2020 03/06/2020 01/22/2020 01/04/2020 06/26/2019 03/13/2019 01/23/2019  PHQ - 2 Score 0 0 0 1 0 0 0  PHQ- 9 Score - - - 8 2 0 -     Fall Risk Fall Risk  03/14/2020 03/06/2020 01/22/2020 01/04/2020 01/04/2020  Falls in the past year? 0 0 0 1 0  Number falls in past yr: - - - 0 -  Injury with Fall? - - - 0 -  Risk for fall due to : - - - Impaired balance/gait -  Follow up - - - Education provided -     Objective:  Jaclyn Schmidt seemed alert and oriented and she participated appropriately during our telephone visit.  Blood Pressure Weight BMI  BP Readings from Last 3 Encounters:  03/14/20 105/68  03/06/20 105/68  01/22/20 120/67   Wt Readings from Last 3 Encounters:  03/14/20 147 lb (66.7 kg)  03/06/20 147 lb 9.6 oz (67 kg)  02/14/20 148 lb (67.1 kg)   BMI Readings from Last 1 Encounters:  03/14/20 26.04 kg/m    *Unable to obtain current vital signs, weight, and BMI due to telephone visit type  Hearing/Vision  . Jaclyn Schmidt did not seem to have difficulty with hearing/understanding during the telephone conversation . Reports that she has not had a formal eye exam by  an eye care professional within the past year . Reports that she has not had a formal hearing evaluation within the past year *Unable to fully  assess hearing and vision during telephone visit type  Cognitive Function: 6CIT Screen 03/14/2020 03/13/2019  What Year? 0 points 0 points  What month? 0 points 0 points  What time? 0 points 0 points  Count back from 20 0 points 0 points  Months in reverse 0 points 0 points  Repeat phrase 0 points 0 points  Total Score 0 0   (Normal:0-7, Significant for Dysfunction: >8)  Normal Cognitive Function Screening: Yes   Immunization & Health Maintenance Record Immunization History  Administered Date(s) Administered  . Fluad Quad(high Dose 65+) 10/26/2019, 10/26/2019  . Influenza Inj Mdck Quad Pf 10/19/2016  . Influenza, High Dose Seasonal PF 11/07/2017, 10/09/2018  . Influenza-Unspecified 10/30/2015  . Moderna Sars-Covid-2 Vaccination 02/21/2019, 03/22/2019, 12/11/2019  . Pneumococcal Conjugate-13 11/07/2017  . Pneumococcal Polysaccharide-23 06/26/2019  . Tdap 02/19/2020  . Zoster 10/29/2014    Health Maintenance  Topic Date Due  . MAMMOGRAM  03/19/2020  . COVID-19 Vaccine (4 - Booster for Moderna series) 06/09/2020  . COLONOSCOPY (Pts 45-55yrs Insurance coverage will need to be confirmed)  02/16/2021  . DEXA SCAN  03/29/2021  . TETANUS/TDAP  02/18/2030  . INFLUENZA VACCINE  Completed  . Hepatitis C Screening  Completed  . PNA vac Low Risk Adult  Completed       Assessment  This is a routine wellness examination for LEXUS SHAMPINE.  Health Maintenance: Due or Overdue There are no preventive care reminders to display for this patient.  Jaclyn Schmidt does not need a referral for Community Assistance: Care Management:   no Social Work:    no Prescription Assistance:  no Nutrition/Diabetes Education:  no   Plan:  Personalized Goals Goals Addressed            This Visit's Progress   . Exercise 3x per week (30 min per time)   On track     Personalized Health Maintenance & Screening Recommendations  up to date - thinking about shingrix vaccine  Lung Cancer  Screening Recommended: no (Low Dose CT Chest recommended if Age 12-80 years, 30 pack-year currently smoking OR have quit w/in past 15 years) Hepatitis C Screening recommended: no HIV Screening recommended: no  Advanced Directives: Written information was not prepared per patient's request.  Referrals & Orders No orders of the defined types were placed in this encounter.   Follow-up Plan . Follow-up with Sharion Balloon, FNP as planned    I have personally reviewed and noted the following in the patient's chart:   . Medical and social history . Use of alcohol, tobacco or illicit drugs  . Current medications and supplements . Functional ability and status . Nutritional status . Physical activity . Advanced directives . List of other physicians . Hospitalizations, surgeries, and ER visits in previous 12 months . Vitals . Screenings to include cognitive, depression, and falls . Referrals and appointments  In addition, I have reviewed and discussed with Jaclyn Schmidt certain preventive protocols, quality metrics, and best practice recommendations. A written personalized care plan for preventive services as well as general preventive health recommendations is available and can be mailed to the patient at her request.      [place] Dec  03/14/2020

## 2020-04-11 ENCOUNTER — Ambulatory Visit (INDEPENDENT_AMBULATORY_CARE_PROVIDER_SITE_OTHER): Payer: Medicare HMO

## 2020-04-11 ENCOUNTER — Other Ambulatory Visit: Payer: Self-pay

## 2020-04-11 DIAGNOSIS — E538 Deficiency of other specified B group vitamins: Secondary | ICD-10-CM | POA: Diagnosis not present

## 2020-05-07 DIAGNOSIS — Z853 Personal history of malignant neoplasm of breast: Secondary | ICD-10-CM | POA: Diagnosis not present

## 2020-05-07 DIAGNOSIS — R922 Inconclusive mammogram: Secondary | ICD-10-CM | POA: Diagnosis not present

## 2020-05-13 ENCOUNTER — Ambulatory Visit (INDEPENDENT_AMBULATORY_CARE_PROVIDER_SITE_OTHER): Payer: Medicare HMO

## 2020-05-13 ENCOUNTER — Other Ambulatory Visit: Payer: Self-pay

## 2020-05-13 DIAGNOSIS — E538 Deficiency of other specified B group vitamins: Secondary | ICD-10-CM | POA: Diagnosis not present

## 2020-05-13 NOTE — Progress Notes (Signed)
Cyanocobalamin injection given to left deltoid.  Patient tolerated well. 

## 2020-05-14 ENCOUNTER — Other Ambulatory Visit: Payer: Self-pay | Admitting: Family Medicine

## 2020-05-14 MED ORDER — MELOXICAM 15 MG PO TABS: 15.0000 mg | ORAL_TABLET | Freq: Every day | ORAL | 0 refills | Status: DC

## 2020-06-09 DIAGNOSIS — E7800 Pure hypercholesterolemia, unspecified: Secondary | ICD-10-CM | POA: Diagnosis not present

## 2020-06-09 DIAGNOSIS — H5203 Hypermetropia, bilateral: Secondary | ICD-10-CM | POA: Diagnosis not present

## 2020-06-12 ENCOUNTER — Other Ambulatory Visit: Payer: Self-pay

## 2020-06-12 ENCOUNTER — Ambulatory Visit (INDEPENDENT_AMBULATORY_CARE_PROVIDER_SITE_OTHER): Payer: Medicare HMO

## 2020-06-12 ENCOUNTER — Ambulatory Visit: Payer: Medicare HMO

## 2020-06-12 DIAGNOSIS — E538 Deficiency of other specified B group vitamins: Secondary | ICD-10-CM

## 2020-06-12 NOTE — Progress Notes (Signed)
Pt tolerated b12 well. 

## 2020-06-20 ENCOUNTER — Encounter: Payer: Self-pay | Admitting: Family Medicine

## 2020-06-20 ENCOUNTER — Other Ambulatory Visit: Payer: Self-pay

## 2020-06-20 ENCOUNTER — Ambulatory Visit (INDEPENDENT_AMBULATORY_CARE_PROVIDER_SITE_OTHER): Payer: Medicare HMO | Admitting: Family Medicine

## 2020-06-20 VITALS — BP 129/76 | HR 81 | Temp 97.8°F | Ht 63.0 in | Wt 144.0 lb

## 2020-06-20 DIAGNOSIS — L237 Allergic contact dermatitis due to plants, except food: Secondary | ICD-10-CM | POA: Diagnosis not present

## 2020-06-20 MED ORDER — METHYLPREDNISOLONE ACETATE 80 MG/ML IJ SUSP: 80.0000 mg | Freq: Once | INTRAMUSCULAR | Status: DC

## 2020-06-20 MED ORDER — METHYLPREDNISOLONE ACETATE 40 MG/ML IJ SUSP
40.0000 mg | Freq: Once | INTRAMUSCULAR | Status: AC
  Administered 2020-06-20: 40 mg via INTRAMUSCULAR

## 2020-06-20 NOTE — Patient Instructions (Signed)
Poison Ivy Dermatitis Poison ivy dermatitis is inflammation of the skin that is caused by chemicals in the leaves of the poison ivy plant. The skin reaction often involves redness, swelling, blisters, and extreme itching. What are the causes? This condition is caused by a chemical (urushiol) found in the sap of the poison ivy plant. This chemical is sticky and can be easily spread to people, animals, and objects. You can get poison ivy dermatitis by:  Having direct contact with a poison ivy plant.  Touching animals, other people, or objects that have come in contact with poison ivy and have the chemical on them. What increases the risk? This condition is more likely to develop in people who:  Are outdoors often in wooded or [place] areas.  Go outdoors without wearing protective clothing, such as closed shoes, long pants, and a long-sleeved shirt. What are the signs or symptoms? Symptoms of this condition include:  Redness of the skin.  Extreme itching.  A rash that often includes bumps and blisters. The rash usually appears 48 hours after exposure, if you have been exposed before. If this is the first time you have been exposed, the rash may not appear until a week after exposure.  Swelling. This may occur if the reaction is more severe. Symptoms usually last for 1-2 weeks. However, the first time you develop this condition, symptoms may last 3-4 weeks.   How is this diagnosed? This condition may be diagnosed based on your symptoms and a physical exam. Your health care provider may also ask you about any recent outdoor activity. How is this treated? Treatment for this condition will vary depending on how severe it is. Treatment may include:  Hydrocortisone cream or calamine lotion to relieve itching.  Oatmeal baths to soothe the skin.  Medicines, such as over-the-counter antihistamine tablets.  Oral steroid medicine, for more severe reactions. Follow these instructions at  home: Medicines  Take or apply over-the-counter and prescription medicines only as told by your health care provider.  Use hydrocortisone cream or calamine lotion as needed to soothe the skin and relieve itching. General instructions  Do not scratch or rub your skin.  Apply a cold, wet cloth (cold compress) to the affected areas or take baths in cool water. This will help with itching. Avoid hot baths and showers.  Take oatmeal baths as needed. Use colloidal oatmeal. You can get this at your local pharmacy or grocery store. Follow the instructions on the packaging.  While you have the rash, wash clothes right after you wear them.  Keep all follow-up visits as told by your health care provider. This is important. How is this prevented?  Learn to identify the poison ivy plant and avoid contact with the plant. This plant can be recognized by the number of leaves. Generally, poison ivy has three leaves with flowering branches on a single stem. The leaves are typically glossy, and they have jagged edges that come to a point at the front.  If you have been exposed to poison ivy, thoroughly wash with soap and water right away. You have about 30 minutes to remove the plant resin before it will cause the rash. Be sure to wash under your fingernails, because any plant resin there will continue to spread the rash.  When hiking or camping, wear clothes that will help you to avoid exposure on the skin. This includes long pants, a long-sleeved shirt, tall socks, and hiking boots. You can also apply preventive lotion to your skin  to help limit exposure.  If you suspect that your clothes or outdoor gear came in contact with poison ivy, rinse them off outside with a garden hose before you bring them inside your house.  When doing yard work or gardening, wear gloves, long sleeves, long pants, and boots. Wash your garden tools and gloves if they come in contact with poison ivy.  If you suspect that your  pet has come into contact with poison ivy, wash him or her with pet shampoo and water. Make sure to wear gloves while washing your pet.   Contact a health care provider if you have:  Open sores in the rash area.  More redness, swelling, or pain in the affected area.  Redness that spreads beyond the rash area.  Fluid, blood, or pus coming from the affected area.  A fever.  A rash over a large area of your body.  A rash on your eyes, mouth, or genitals.  A rash that does not improve after a few weeks. Get help right away if:  Your face swells or your eyes swell shut.  You have trouble breathing.  You have trouble swallowing. These symptoms may represent a serious problem that is an emergency. Do not wait to see if the symptoms will go away. Get medical help right away. Call your local emergency services (911 in the [place] Do not drive yourself to the hospital. Summary  Poison ivy dermatitis is inflammation of the skin that is caused by chemicals in the leaves of the poison ivy plant.  Symptoms of this condition include redness, itching, a rash, and swelling.  Do not scratch or rub your skin.  Take or apply over-the-counter and prescription medicines only as told by your health care provider. This information is not intended to replace advice given to you by your health care provider. Make sure you discuss any questions you have with your health care provider. Document Revised: 04/28/2018 Document Reviewed: 12/30/2017 Elsevier Patient Education  2021 [employer].

## 2020-06-20 NOTE — Progress Notes (Signed)
Acute Office Visit  Subjective:    Patient ID: Jaclyn Schmidt, female    DOB: 09/10/1950, 70 y.o.   MRN: [id number]  Chief Complaint  Patient presents with  . Poison Ivy    HPI Patient is in today for poison ivy x5 days after doing yard work. The rash is on her chest, arms, and legs. She has tried calamine lotion, allergy medicine, and a steroid cream with little improvement. Denies signs of infection.   Past Medical History:  Diagnosis Date  . Anxiety   . Breast cancer ([place]) 05/2018   Right Breast Cancer  . Cancer ([place]) 04/2018   right breast cancer  . Cholelithiasis   . Depression   . GERD (gastroesophageal reflux disease)   . History of kidney stones   . Hyperlipidemia   . Hypothyroidism   . Irritable bowel syndrome   . Personal history of radiation therapy 2020   Right Breast Cancer  . Pneumonia   . Thyroid disease   . Vitamin D deficiency     Past Surgical History:  Procedure Laterality Date  . ABDOMINAL HYSTERECTOMY  [id number]  . ANTERIOR CERVICAL DECOMP/DISCECTOMY FUSION N/A 01/21/2017   Procedure: C3-4, C4-5 ANTERIOR CERVICAL DISCECTOMY AND FUSION, ALLOGRAFT, PLATE;  Surgeon: [name], MD;  Location: [facility];  Service: Orthopedics;  Laterality: N/A;  . APPENDECTOMY  1996  . BREAST LUMPECTOMY Right 05/30/2018  . BREAST LUMPECTOMY WITH RADIOACTIVE SEED AND SENTINEL LYMPH NODE BIOPSY Right 05/30/2018   Procedure: RIGHT BREAST LUMPECTOMY WITH RADIOACTIVE SEED AND RIGHT AXILLARY SENTINEL LYMPH NODE BIOPSY;  Surgeon: Alphonsa Overall, MD;  Location: [facility];  Service: General;  Laterality: Right;  . BREAST MASS EXCISION  1997   left  . CHOLECYSTECTOMY  07/15/2011   Procedure: LAPAROSCOPIC CHOLECYSTECTOMY WITH INTRAOPERATIVE CHOLANGIOGRAM;  Surgeon: Shann Medal, MD;  Location[facility];  Service: General;  Laterality: N/A;  Cholecystectomy with IOC  . TOE FUSION    . TUBAL LIGATION  1980    Family History  Problem Relation Age of Onset  . Cancer  Mother        stomach  . Heart disease Father   . Cancer Maternal Aunt        ovarian  . Cancer Paternal Aunt        breast  . Diabetes Brother   . Hypertension Brother     Social History   Socioeconomic History  . Marital status: Divorced    Spouse name: Not on file  . Number of children: 1  . Years of education: 56  . Highest education level: High school graduate  Occupational History  . Occupation: Retired     Fish farm manager: [employer]  . Occupation: full time    Comment: sitter  Tobacco Use  . Smoking status: Current Some Day Smoker    Packs/day: 0.25    Years: 36.00    Pack years: 9.00    Types: Cigarettes    Start date: 09/30/1973    Last attempt to quit: 04/26/2018    Years since quitting: 2.1  . Smokeless tobacco: Never Used  . Tobacco comment: a pack per month  Vaping Use  . Vaping Use: Never used  Substance and Sexual Activity  . Alcohol use: No    Alcohol/week: 0.0 standard drinks  . Drug use: No  . Sexual activity: Not on file  Other Topics Concern  . Not on file  Social History Narrative   3 grandchildren. Enjoys dancing and  going out to eat.    Social Determinants of Health   Financial Resource Strain: Not on file  Food Insecurity: Not on file  Transportation Needs: Not on file  Physical Activity: Not on file  Stress: Not on file  Social Connections: Not on file  Intimate Partner Violence: Not on file    Outpatient Medications Prior to Visit  Medication Sig Dispense Refill  . ALPRAZolam (XANAX) 0.5 MG tablet Take 1 tablet (0.5 mg total) by mouth at bedtime as needed for anxiety. 20 tablet 1  . anastrozole (ARIMIDEX) 1 MG tablet Take by mouth.    Marland Kitchen BIOTIN PO Take 2 tablets by mouth daily.    . calcium carbonate (OSCAL) 1500 (600 Ca) MG TABS tablet Take by mouth 2 (two) times daily with a meal.    . cetirizine (ZYRTEC) 10 MG tablet Take 10 mg by mouth daily.    . Cholecalciferol (VITAMIN D3 PO) Take 4,000 Int'l Units by mouth daily.    .  fluticasone (FLONASE) 50 MCG/ACT nasal spray Place 2 sprays into both nostrils daily as needed (FOR ALLERGIES.). 48 g 1  . gabapentin (NEURONTIN) 300 MG capsule Take 600 mg in AM and 600 mg in the evening 360 capsule 4  . levothyroxine (SYNTHROID) 75 MCG tablet Take 1 tablet (75 mcg total) by mouth daily. 90 tablet 3  . meclizine (ANTIVERT) 25 MG tablet Take 1 tablet (25 mg total) by mouth 3 (three) times daily as needed for dizziness. 60 tablet 3  . meloxicam (MOBIC) 15 MG tablet Take 1 tablet (15 mg total) by mouth daily. 90 tablet 0  . pravastatin (PRAVACHOL) 40 MG tablet Take 1 tablet (40 mg total) by mouth daily. 90 tablet 1  . sertraline (ZOLOFT) 50 MG tablet Take 1 tablet (50 mg total) by mouth daily. 90 tablet 1   No facility-administered medications prior to visit.    Allergies  Allergen Reactions  . Penicillins Rash and Other (See Comments)    PATIENT HAS HAD A PCN REACTION WITH IMMEDIATE RASH, FACIAL/TONGUE/THROAT SWELLING, SOB, OR LIGHTHEADEDNESS WITH HYPOTENSION:  #  #  #  YES  #  #  #   Has patient had a PCN reaction causing severe rash involving mucus membranes or skin necrosis: No Has patient had a PCN reaction that required hospitalization: No Has patient had a PCN reaction occurring within the last 10 years: No If all of the above answers are "NO", then may proceed with Cephalosporin use.   Marland Kitchen Doxycycline Rash    Review of Systems As per HPI.    Objective:    Physical Exam Vitals and nursing note reviewed.  Constitutional:      General: She is not in acute distress.    Appearance: She is not ill-appearing, toxic-appearing or diaphoretic.  Cardiovascular:     Rate and Rhythm: Regular rhythm.  Musculoskeletal:     Right lower leg: No edema.     Left lower leg: No edema.  Skin:    Findings: Rash (consistent with plant dermatitis to chest, arms, and legs) present.  Neurological:     General: No focal deficit present.     Mental Status: She is alert and oriented  to person, place, and time.  Psychiatric:        Mood and Affect: Mood normal.        Behavior: Behavior normal.     There were no vitals taken for this visit. Wt Readings from Last 3 Encounters:  03/14/20 147  lb (66.7 kg)  03/06/20 147 lb 9.6 oz (67 kg)  02/14/20 148 lb (67.1 kg)    Health Maintenance Due  Topic Date Due  . Pneumococcal Vaccine 61-22 Years old (1 of 4 - PCV13) Never done  . Zoster Vaccines- Shingrix (1 of 2) Never done  . COVID-19 Vaccine (4 - Booster for Moderna series) 03/12/2020    There are no preventive care reminders to display for this patient.   Lab Results  Component Value Date   TSH 0.127 (L) 03/06/2020   Lab Results  Component Value Date   WBC 3.9 03/06/2020   HGB 13.8 03/06/2020   HCT 40.4 03/06/2020   MCV 88 03/06/2020   PLT 300 03/06/2020   Lab Results  Component Value Date   NA 140 03/06/2020   K 4.0 03/06/2020   CO2 20 03/06/2020   GLUCOSE 136 (H) 03/06/2020   BUN 18 03/06/2020   CREATININE 0.63 03/06/2020   BILITOT 0.4 03/06/2020   ALKPHOS 84 03/06/2020   AST 17 03/06/2020   ALT 11 03/06/2020   PROT 6.6 03/06/2020   ALBUMIN 4.3 03/06/2020   CALCIUM 9.4 03/06/2020   ANIONGAP 11 01/12/2017   Lab Results  Component Value Date   CHOL 153 03/06/2020   Lab Results  Component Value Date   HDL 35 (L) 03/06/2020   Lab Results  Component Value Date   LDLCALC 72 03/06/2020   Lab Results  Component Value Date   TRIG 284 (H) 03/06/2020   Lab Results  Component Value Date   CHOLHDL 4.4 03/06/2020   Lab Results  Component Value Date   HGBA1C 5.4 03/06/2020       Assessment & Plan:   Angeleen was seen today for poison ivy.  Diagnoses and all orders for this visit:  Plant allergic contact dermatitis Steroid IM injection today in office. Continue antihistamine, calamine lotion, and steroid cream. Return to office for new or worsening symptoms, or if symptoms persist.  -     methylPREDNISolone acetate (DEPO-MEDROL)  injection 80 mg  The patient indicates understanding of these issues and agrees with the plan.  Gwenlyn Perking, FNP

## 2020-07-07 ENCOUNTER — Ambulatory Visit: Payer: Medicare HMO | Admitting: Family

## 2020-07-08 DIAGNOSIS — Z170 Estrogen receptor positive status [ER+]: Secondary | ICD-10-CM | POA: Diagnosis not present

## 2020-07-08 DIAGNOSIS — Z79811 Long term (current) use of aromatase inhibitors: Secondary | ICD-10-CM | POA: Diagnosis not present

## 2020-07-08 DIAGNOSIS — M8580 Other specified disorders of bone density and structure, unspecified site: Secondary | ICD-10-CM | POA: Diagnosis not present

## 2020-07-08 DIAGNOSIS — C50511 Malignant neoplasm of lower-outer quadrant of right female breast: Secondary | ICD-10-CM | POA: Diagnosis not present

## 2020-07-08 DIAGNOSIS — E538 Deficiency of other specified B group vitamins: Secondary | ICD-10-CM | POA: Diagnosis not present

## 2020-07-17 ENCOUNTER — Ambulatory Visit: Payer: Self-pay

## 2020-07-17 ENCOUNTER — Ambulatory Visit: Payer: Self-pay | Admitting: Family

## 2020-07-18 ENCOUNTER — Other Ambulatory Visit: Payer: Self-pay

## 2020-07-18 ENCOUNTER — Ambulatory Visit (INDEPENDENT_AMBULATORY_CARE_PROVIDER_SITE_OTHER): Payer: Medicare HMO

## 2020-07-18 DIAGNOSIS — E538 Deficiency of other specified B group vitamins: Secondary | ICD-10-CM | POA: Diagnosis not present

## 2020-07-18 MED ORDER — CYANOCOBALAMIN 1000 MCG/ML IJ SOLN
1000.0000 ug | INTRAMUSCULAR | Status: AC
  Administered 2020-07-18 – 2024-02-06 (×40): 1000 ug via INTRAMUSCULAR

## 2020-07-18 NOTE — Progress Notes (Signed)
B12 injection given to patient and tolerated well.  

## 2020-07-24 ENCOUNTER — Other Ambulatory Visit: Payer: Self-pay | Admitting: Family

## 2020-07-30 ENCOUNTER — Other Ambulatory Visit: Payer: Self-pay | Admitting: Family

## 2020-08-18 ENCOUNTER — Ambulatory Visit (INDEPENDENT_AMBULATORY_CARE_PROVIDER_SITE_OTHER): Payer: Medicare HMO | Admitting: *Deleted

## 2020-08-18 ENCOUNTER — Other Ambulatory Visit: Payer: Self-pay

## 2020-08-18 DIAGNOSIS — E538 Deficiency of other specified B group vitamins: Secondary | ICD-10-CM

## 2020-08-27 ENCOUNTER — Other Ambulatory Visit: Payer: Self-pay

## 2020-08-27 ENCOUNTER — Ambulatory Visit (INDEPENDENT_AMBULATORY_CARE_PROVIDER_SITE_OTHER): Payer: Medicare HMO

## 2020-08-27 DIAGNOSIS — Z23 Encounter for immunization: Secondary | ICD-10-CM

## 2020-09-02 ENCOUNTER — Ambulatory Visit (INDEPENDENT_AMBULATORY_CARE_PROVIDER_SITE_OTHER): Payer: Medicare HMO | Admitting: Family

## 2020-09-02 ENCOUNTER — Other Ambulatory Visit: Payer: Self-pay

## 2020-09-02 ENCOUNTER — Encounter: Payer: Self-pay | Admitting: Family

## 2020-09-02 VITALS — BP 117/70 | HR 76 | Temp 97.6°F | Ht 63.0 in | Wt 144.4 lb

## 2020-09-02 DIAGNOSIS — M546 Pain in thoracic spine: Secondary | ICD-10-CM | POA: Diagnosis not present

## 2020-09-02 DIAGNOSIS — E785 Hyperlipidemia, unspecified: Secondary | ICD-10-CM

## 2020-09-02 DIAGNOSIS — F411 Generalized anxiety disorder: Secondary | ICD-10-CM

## 2020-09-02 DIAGNOSIS — E039 Hypothyroidism, unspecified: Secondary | ICD-10-CM

## 2020-09-02 DIAGNOSIS — R69 Illness, unspecified: Secondary | ICD-10-CM | POA: Diagnosis not present

## 2020-09-02 DIAGNOSIS — K6289 Other specified diseases of anus and rectum: Secondary | ICD-10-CM

## 2020-09-02 DIAGNOSIS — Z79899 Other long term (current) drug therapy: Secondary | ICD-10-CM

## 2020-09-02 DIAGNOSIS — F339 Major depressive disorder, recurrent, unspecified: Secondary | ICD-10-CM

## 2020-09-02 DIAGNOSIS — G8929 Other chronic pain: Secondary | ICD-10-CM

## 2020-09-02 MED ORDER — ALPRAZOLAM 0.5 MG PO TABS: 0.5000 mg | ORAL_TABLET | Freq: Every evening | ORAL | 1 refills | Status: DC | PRN

## 2020-09-02 NOTE — Progress Notes (Signed)
Subjective:    Patient ID: Jaclyn Schmidt, female    DOB: 04/22/1950, 70 y.o.   MRN: [id number]  Chief Complaint  Patient presents with   Medical Management of Chronic Issues    Wants abx cream for insect bites    PT presents to the office today for chronic follow up. She is followed by Oncologists every 3-4 months for Right Breast cancer. She completed radiation 2020. States she is doing well at this time. She has anal sphincter incompetence and taking  Metamucil. She does pelvic floor exercises daily.    She also has Vit B 12 deficiency and gets monthly injections.  Thyroid Problem Presents for follow-up visit. Symptoms include diarrhea and fatigue. Patient reports no constipation, diaphoresis or hoarse voice. The symptoms have been stable. Her past medical history is significant for hyperlipidemia.  Back Pain This is a chronic problem. The current episode started more than 1 year ago. The problem occurs intermittently. The pain is present in the lumbar spine. The quality of the pain is described as aching. The pain is at a severity of 8/10. The pain is moderate. Treatments tried: tylenol.  Depression        This is a chronic problem.  The current episode started more than 1 year ago.   The onset quality is gradual.   Associated symptoms include fatigue.  Associated symptoms include no helplessness, no hopelessness, not irritable, no decreased interest and not sad.  Past treatments include SSRIs - Selective serotonin reuptake inhibitors.  Past medical history includes thyroid problem.   Hyperlipidemia This is a chronic problem. The current episode started more than 1 year ago. Current antihyperlipidemic treatment includes statins. The current treatment provides moderate improvement of lipids.     Review of Systems  Constitutional:  Positive for fatigue. Negative for diaphoresis.  HENT:  Negative for hoarse voice.   Gastrointestinal:  Positive for diarrhea. Negative for constipation.   Musculoskeletal:  Positive for back pain.  Psychiatric/Behavioral:  Positive for depression.   All other systems reviewed and are negative.     Objective:   Physical Exam Vitals reviewed.  Constitutional:      General: She is not irritable.She is not in acute distress.    Appearance: She is well-developed.  HENT:     Head: Normocephalic and atraumatic.     Right Ear: Tympanic membrane normal.     Left Ear: Tympanic membrane normal.  Eyes:     Pupils: Pupils are equal, round, and reactive to light.  Neck:     Thyroid: No thyromegaly.  Cardiovascular:     Rate and Rhythm: Normal rate and regular rhythm.     Heart sounds: Normal heart sounds. No murmur heard. Pulmonary:     Effort: Pulmonary effort is normal. No respiratory distress.     Breath sounds: Normal breath sounds. No wheezing.  Abdominal:     General: Bowel sounds are normal. There is no distension.     Palpations: Abdomen is soft.     Tenderness: There is no abdominal tenderness.  Musculoskeletal:        General: No tenderness. Normal range of motion.     Cervical back: Normal range of motion and neck supple.  Skin:    General: Skin is warm and dry.  Neurological:     Mental Status: She is alert and oriented to person, place, and time.     Cranial Nerves: No cranial nerve deficit.     Deep Tendon Reflexes: Reflexes are  normal and symmetric.  Psychiatric:        Behavior: Behavior normal.        Thought Content: Thought content normal.        Judgment: Judgment normal.       BP 117/70   Pulse 76   Temp 97.6 F (36.4 C) (Temporal)   Ht _0  (1.6 m)   Wt 144 lb 6.4 oz (65.5 kg)   SpO2 98%   BMI 25.58 kg/m   Assessment & Plan:  Jaclyn Schmidt comes in today with chief complaint of Medical Management of Chronic Issues (Wants abx cream for insect bites )   Diagnosis and orders addressed:  1. Controlled substance agreement signed  - ALPRAZolam (XANAX) 0.5 MG tablet; Take 1 tablet (0.5 mg total) by  mouth at bedtime as needed for anxiety.  Dispense: 20 tablet; Refill: 1 - CMP14+EGFR - CBC with Differential/Platelet  2. GAD (generalized anxiety disorder) - ALPRAZolam (XANAX) 0.5 MG tablet; Take 1 tablet (0.5 mg total) by mouth at bedtime as needed for anxiety.  Dispense: 20 tablet; Refill: 1 - CMP14+EGFR - CBC with Differential/Platelet  3. Hypothyroidism, unspecified type  - CMP14+EGFR - CBC with Differential/Platelet - TSH  4. Chronic midline thoracic back pain - CMP14+EGFR - CBC with Differential/Platelet  5. Anal sphincter incompetence - CMP14+EGFR - CBC with Differential/Platelet  6. Depression, recurrent ([place])  - CMP14+EGFR - CBC with Differential/Platelet  7. Hyperlipidemia, unspecified hyperlipidemia type - CMP14+EGFR - CBC with Differential/Platelet - Lipid panel   Labs pending Patient reviewed in [place] controlled database, no flags noted. Contract and drug screen up dated today.  Health Maintenance reviewed Diet and exercise encouraged  Follow up plan: 3 months   Evelina Dun, FNP

## 2020-09-02 NOTE — Patient Instructions (Signed)

## 2020-09-03 LAB — CMP14+EGFR
ALT: 11 IU/L (ref 0–32)
AST: 17 IU/L (ref 0–40)
Albumin/Globulin Ratio: 2.3 — ABNORMAL HIGH (ref 1.2–2.2)
Albumin: 4.5 g/dL (ref 3.8–4.8)
Alkaline Phosphatase: 97 IU/L (ref 44–121)
BUN/Creatinine Ratio: 12 (ref 12–28)
BUN: 8 mg/dL (ref 8–27)
Bilirubin Total: 0.3 mg/dL (ref 0.0–1.2)
CO2: 18 mmol/L — ABNORMAL LOW (ref 20–29)
Calcium: 9.3 mg/dL (ref 8.7–10.3)
Chloride: 104 mmol/L (ref 96–106)
Creatinine, Ser: 0.69 mg/dL (ref 0.57–1.00)
Globulin, Total: 2 g/dL (ref 1.5–4.5)
Glucose: 95 mg/dL (ref 65–99)
Potassium: 4 mmol/L (ref 3.5–5.2)
Sodium: 141 mmol/L (ref 134–144)
Total Protein: 6.5 g/dL (ref 6.0–8.5)
eGFR: 94 mL/min/{1.73_m2} (ref >59)

## 2020-09-03 LAB — CBC WITH DIFFERENTIAL/PLATELET
Basophils Absolute: 0 10*3/uL (ref 0.0–0.2)
Basos: 1 %
EOS (ABSOLUTE): 0.2 10*3/uL (ref 0.0–0.4)
Eos: 4 %
Hematocrit: 39 % (ref 34.0–46.6)
Hemoglobin: 13.1 g/dL (ref 11.1–15.9)
Immature Grans (Abs): 0 10*3/uL (ref 0.0–0.1)
Immature Granulocytes: 0 %
Lymphocytes Absolute: 1.6 10*3/uL (ref 0.7–3.1)
Lymphs: 44 %
MCH: 29.8 pg (ref 26.6–33.0)
MCHC: 33.6 g/dL (ref 31.5–35.7)
MCV: 89 fL (ref 79–97)
Monocytes Absolute: 0.5 10*3/uL (ref 0.1–0.9)
Monocytes: 13 %
Neutrophils Absolute: 1.4 10*3/uL (ref 1.4–7.0)
Neutrophils: 38 %
Platelets: 274 10*3/uL (ref 150–450)
RBC: 4.4 x10E6/uL (ref 3.77–5.28)
RDW: 12.6 % (ref 11.7–15.4)
WBC: 3.6 10*3/uL (ref 3.4–10.8)

## 2020-09-03 LAB — LIPID PANEL
Chol/HDL Ratio: 4.3 ratio (ref 0.0–4.4)
Cholesterol, Total: 164 mg/dL (ref 100–199)
HDL: 38 mg/dL — ABNORMAL LOW (ref >39)
LDL Chol Calc (NIH): 96 mg/dL (ref 0–99)
Triglycerides: 174 mg/dL — ABNORMAL HIGH (ref 0–149)
VLDL Cholesterol Cal: 30 mg/dL (ref 5–40)

## 2020-09-03 LAB — TSH: TSH: 0.644 u[IU]/mL (ref 0.450–4.500)

## 2020-09-04 ENCOUNTER — Other Ambulatory Visit: Payer: Self-pay | Admitting: Family

## 2020-09-04 MED ORDER — ROSUVASTATIN CALCIUM 10 MG PO TABS: 10.0000 mg | ORAL_TABLET | Freq: Every day | ORAL | 3 refills | Status: DC

## 2020-09-05 LAB — TOXASSURE SELECT 13 (MW), URINE

## 2020-09-05 NOTE — Progress Notes (Signed)
Pt returning call about labs  

## 2020-09-18 ENCOUNTER — Other Ambulatory Visit: Payer: Self-pay

## 2020-09-18 ENCOUNTER — Ambulatory Visit (INDEPENDENT_AMBULATORY_CARE_PROVIDER_SITE_OTHER): Payer: Medicare HMO

## 2020-09-18 DIAGNOSIS — E538 Deficiency of other specified B group vitamins: Secondary | ICD-10-CM

## 2020-09-18 NOTE — Progress Notes (Signed)
Cyanocobalamin injection given to left deltoid.  Patient tolerated well. 

## 2020-09-25 ENCOUNTER — Other Ambulatory Visit: Payer: Self-pay

## 2020-09-25 DIAGNOSIS — K08109 Complete loss of teeth, unspecified cause, unspecified class: Secondary | ICD-10-CM | POA: Diagnosis not present

## 2020-09-25 DIAGNOSIS — R69 Illness, unspecified: Secondary | ICD-10-CM | POA: Diagnosis not present

## 2020-09-25 DIAGNOSIS — E039 Hypothyroidism, unspecified: Secondary | ICD-10-CM | POA: Diagnosis not present

## 2020-09-25 DIAGNOSIS — J449 Chronic obstructive pulmonary disease, unspecified: Secondary | ICD-10-CM | POA: Diagnosis not present

## 2020-09-25 DIAGNOSIS — G8929 Other chronic pain: Secondary | ICD-10-CM | POA: Diagnosis not present

## 2020-09-25 DIAGNOSIS — E785 Hyperlipidemia, unspecified: Secondary | ICD-10-CM | POA: Diagnosis not present

## 2020-09-25 DIAGNOSIS — C50911 Malignant neoplasm of unspecified site of right female breast: Secondary | ICD-10-CM | POA: Diagnosis not present

## 2020-09-25 DIAGNOSIS — J309 Allergic rhinitis, unspecified: Secondary | ICD-10-CM | POA: Diagnosis not present

## 2020-10-17 ENCOUNTER — Other Ambulatory Visit: Payer: Self-pay | Admitting: Family

## 2020-10-20 ENCOUNTER — Other Ambulatory Visit: Payer: Self-pay

## 2020-10-20 ENCOUNTER — Ambulatory Visit (INDEPENDENT_AMBULATORY_CARE_PROVIDER_SITE_OTHER): Payer: Medicare HMO

## 2020-10-20 DIAGNOSIS — E538 Deficiency of other specified B group vitamins: Secondary | ICD-10-CM

## 2020-10-20 MED ORDER — TRIAMCINOLONE ACETONIDE 0.5 % EX OINT: 1.0000 "application " | TOPICAL_OINTMENT | Freq: Two times a day (BID) | CUTANEOUS | 2 refills | Status: DC

## 2020-10-20 MED ORDER — LEVOTHYROXINE SODIUM 75 MCG PO TABS: 75.0000 ug | ORAL_TABLET | Freq: Every day | ORAL | 3 refills | Status: DC

## 2020-10-20 NOTE — Progress Notes (Signed)
Cyanocobalamin injection given to left deltoid.  Patient tolerated well. 

## 2020-10-20 NOTE — Telephone Encounter (Signed)
Patient had a tube of Triamcinolone 0.5% cream that she would use when she got bug bites but she is out.  She would like to know if you will send in refills to [facility] please.

## 2020-10-25 ENCOUNTER — Other Ambulatory Visit: Payer: Self-pay | Admitting: Family

## 2020-10-27 NOTE — Telephone Encounter (Signed)
Pt has been taking the 88 mcg dose, she has 2 pills left. Explained to her that Alyse Low had changed her dose back on 03/06/20 based on her labwork to the 75 mcg and on 09/02/20 her labwork was normal, this is why the nurse refilled her medication at the 75 mcg. She agreed to take the 75 mcg and return in about 6 weeks, appt made on 12/18/20 to see how her thyroid is doing on the 75 mcg.

## 2020-10-27 NOTE — Telephone Encounter (Signed)
LMOVM to call back regarding her thyroid dosage

## 2020-11-04 ENCOUNTER — Other Ambulatory Visit: Payer: Self-pay | Admitting: Family

## 2020-11-04 DIAGNOSIS — F339 Major depressive disorder, recurrent, unspecified: Secondary | ICD-10-CM

## 2020-11-06 ENCOUNTER — Other Ambulatory Visit: Payer: Self-pay | Admitting: Family

## 2020-11-06 DIAGNOSIS — M79676 Pain in unspecified toe(s): Secondary | ICD-10-CM | POA: Diagnosis not present

## 2020-11-06 DIAGNOSIS — B351 Tinea unguium: Secondary | ICD-10-CM | POA: Diagnosis not present

## 2020-11-10 NOTE — Telephone Encounter (Signed)
KeyJoyce Copa - PA Case ID: [id number] Need help? Call us at [phone] Status Sent to [facility] Meclizine HCl 25MG  tablets

## 2020-11-20 ENCOUNTER — Other Ambulatory Visit: Payer: Self-pay

## 2020-11-20 ENCOUNTER — Ambulatory Visit (INDEPENDENT_AMBULATORY_CARE_PROVIDER_SITE_OTHER): Payer: Medicare HMO | Admitting: *Deleted

## 2020-11-20 DIAGNOSIS — E538 Deficiency of other specified B group vitamins: Secondary | ICD-10-CM | POA: Diagnosis not present

## 2020-11-20 NOTE — Progress Notes (Signed)
Vitamin b12 injection given and patient tolerated well.  

## 2020-12-04 DIAGNOSIS — R102 Pelvic and perineal pain: Secondary | ICD-10-CM | POA: Diagnosis not present

## 2020-12-04 DIAGNOSIS — Z1211 Encounter for screening for malignant neoplasm of colon: Secondary | ICD-10-CM | POA: Diagnosis not present

## 2020-12-08 DIAGNOSIS — Z90710 Acquired absence of both cervix and uterus: Secondary | ICD-10-CM | POA: Diagnosis not present

## 2020-12-08 DIAGNOSIS — R102 Pelvic and perineal pain: Secondary | ICD-10-CM | POA: Diagnosis not present

## 2020-12-08 DIAGNOSIS — Z780 Asymptomatic menopausal state: Secondary | ICD-10-CM | POA: Diagnosis not present

## 2020-12-10 ENCOUNTER — Encounter: Payer: Self-pay | Admitting: Family Medicine

## 2020-12-10 ENCOUNTER — Ambulatory Visit (INDEPENDENT_AMBULATORY_CARE_PROVIDER_SITE_OTHER): Payer: Medicare HMO | Admitting: Family Medicine

## 2020-12-10 DIAGNOSIS — K625 Hemorrhage of anus and rectum: Secondary | ICD-10-CM | POA: Diagnosis not present

## 2020-12-10 DIAGNOSIS — J301 Allergic rhinitis due to pollen: Secondary | ICD-10-CM

## 2020-12-10 MED ORDER — GUAIFENESIN ER 600 MG PO TB12: 600.0000 mg | ORAL_TABLET | Freq: Two times a day (BID) | ORAL | 0 refills | Status: DC

## 2020-12-10 MED ORDER — PREDNISONE 20 MG PO TABS: 40.0000 mg | ORAL_TABLET | Freq: Every day | ORAL | 0 refills | Status: AC

## 2020-12-10 NOTE — Progress Notes (Signed)
Virtual Visit via telephone Note Due to COVID-19 pandemic this visit was conducted virtually. This visit type was conducted due to national recommendations for restrictions regarding the COVID-19 Pandemic (e.g. social distancing, sheltering in place) in an effort to limit this patient's exposure and mitigate transmission in our community. All issues noted in this document were discussed and addressed.  A physical exam was not performed with this format.   I connected with Jaclyn Schmidt on 12/10/2020 at [phone] by telephone and verified that I am speaking with the correct person using two identifiers. Jaclyn Schmidt is currently located at home and family is currently with them during visit. The provider, Monia Pouch, FNP is located in their office at time of visit.  I discussed the limitations, risks, security and privacy concerns of performing an evaluation and management service by telephone and the availability of in person appointments. I also discussed with the patient that there may be a patient responsible charge related to this service. The patient expressed understanding and agreed to proceed.  Subjective:  Patient ID: Jaclyn Schmidt, female    DOB: 12/08/1950, 70 y.o.   MRN: [id number]  Chief Complaint:  Allergies   HPI: Jaclyn Schmidt is a 70 y.o. female presenting on 12/10/2020 for Allergies   Patient reports she was cleaning leaves out of her barn last week when symptoms started.  States that her eyes are itchy and watery.  States that her throat is itchy and scratchy.  States she has rhinorrhea and congestion.  She has been using Flonase and Alka-Seltzer cold and congestion without relief of symptoms.  She denies any fever, chills, body aches, or fatigue.  No known sick exposures.    Relevant past medical, surgical, family, and social history reviewed and updated as indicated.  Allergies and medications reviewed and updated.   Past Medical History:  Diagnosis Date    Anxiety    Breast cancer ([place]) 05/2018   Right Breast Cancer   Cancer ([place]) 04/2018   right breast cancer   Cholelithiasis    Depression    GERD (gastroesophageal reflux disease)    History of kidney stones    Hyperlipidemia    Hypothyroidism    Irritable bowel syndrome    Personal history of radiation therapy 2020   Right Breast Cancer   Pneumonia    Thyroid disease    Vitamin D deficiency     Past Surgical History:  Procedure Laterality Date   ABDOMINAL HYSTERECTOMY  [id number]   ANTERIOR CERVICAL DECOMP/DISCECTOMY FUSION N/A 01/21/2017   Procedure: C3-4, C4-5 ANTERIOR CERVICAL DISCECTOMY AND FUSION, ALLOGRAFT, PLATE;  Surgeon: [name], MD;  Location: [facility];  Service: Orthopedics;  Laterality: N/A;   APPENDECTOMY  1996   BREAST LUMPECTOMY Right 05/30/2018   BREAST LUMPECTOMY WITH RADIOACTIVE SEED AND SENTINEL LYMPH NODE BIOPSY Right 05/30/2018   Procedure: RIGHT BREAST LUMPECTOMY WITH RADIOACTIVE SEED AND RIGHT AXILLARY SENTINEL LYMPH NODE BIOPSY;  Surgeon: Alphonsa Overall, MD;  Location: [facility];  Service: General;  Laterality: Right;   BREAST MASS EXCISION  1997   left   CHOLECYSTECTOMY  07/15/2011   Procedure: LAPAROSCOPIC CHOLECYSTECTOMY WITH INTRAOPERATIVE CHOLANGIOGRAM;  Surgeon: Shann Medal, MD;  Location[facility];  Service: General;  Laterality: N/A;  Cholecystectomy with IOC   TOE FUSION     TUBAL LIGATION  1980    Social History   Socioeconomic History   Marital status: Divorced    Spouse name: Not on file  Number of children: 1   Years of education: 12   Highest education level: High school graduate  Occupational History   Occupation: Retired     Fish farm manager: [employer]   Occupation: full time    CommentQuarry manager  Tobacco Use   Smoking status: Some Days    Packs/day: 0.25    Years: 36.00    Pack years: 9.00    Types: Cigarettes    Start date: 09/30/1973    Last attempt to quit: 04/26/2018    Years since quitting: 2.6   Smokeless  tobacco: Never   Tobacco comments:    a pack per month  Vaping Use   Vaping Use: Never used  Substance and Sexual Activity   Alcohol use: No    Alcohol/week: 0.0 standard drinks   Drug use: No   Sexual activity: Not on file  Other Topics Concern   Not on file  Social History Narrative   3 grandchildren. Enjoys dancing and going out to eat.    Social Determinants of Health   Financial Resource Strain: Not on file  Food Insecurity: Not on file  Transportation Needs: Not on file  Physical Activity: Not on file  Stress: Not on file  Social Connections: Not on file  Intimate Partner Violence: Not on file    Outpatient Encounter Medications as of 12/10/2020  Medication Sig   anastrozole (ARIMIDEX) 1 MG tablet Take 1 tablet by mouth daily.   guaiFENesin (MUCINEX) 600 MG 12 hr tablet Take 1 tablet (600 mg total) by mouth 2 (two) times daily for 10 days.   predniSONE (DELTASONE) 20 MG tablet Take 2 tablets (40 mg total) by mouth daily with breakfast for 5 days.   ALPRAZolam (XANAX) 0.5 MG tablet Take 1 tablet (0.5 mg total) by mouth at bedtime as needed for anxiety.   BIOTIN PO Take 2 tablets by mouth daily.   calcium carbonate (OSCAL) 1500 (600 Ca) MG TABS tablet Take by mouth 2 (two) times daily with a meal.   cetirizine (ZYRTEC) 10 MG tablet Take 10 mg by mouth daily.   Cholecalciferol (VITAMIN D3 PO) Take 4,000 Int'l Units by mouth daily.   fluticasone (FLONASE) 50 MCG/ACT nasal spray PLACE 2 SPRAYS INTO BOTH NOSTRILS DAILY AS NEEDED (FOR ALLERGIES.).   gabapentin (NEURONTIN) 300 MG capsule Take 600 mg in AM and 600 mg in the evening   levothyroxine (SYNTHROID) 75 MCG tablet Take 1 tablet (75 mcg total) by mouth daily.   meclizine (ANTIVERT) 25 MG tablet Take 1 tablet (25 mg total) by mouth 3 (three) times daily as needed for dizziness.   meloxicam (MOBIC) 15 MG tablet Take 1 tablet (15 mg total) by mouth daily.   pravastatin (PRAVACHOL) 40 MG tablet Take 1 tablet (40 mg total) by  mouth daily.   rosuvastatin (CRESTOR) 10 MG tablet Take 1 tablet (10 mg total) by mouth daily.   sertraline (ZOLOFT) 50 MG tablet TAKE 1 TABLET BY MOUTH EVERY DAY   SHINGRIX injection    triamcinolone ointment (KENALOG) 0.5 % Apply 1 application topically 2 (two) times daily.   [DISCONTINUED] anastrozole (ARIMIDEX) 1 MG tablet Take by mouth.   Facility-Administered Encounter Medications as of 12/10/2020  Medication   cyanocobalamin ((VITAMIN B-12)) injection 1,000 mcg    Allergies  Allergen Reactions   Penicillins Rash and Other (See Comments)    PATIENT HAS HAD A PCN REACTION WITH IMMEDIATE RASH, FACIAL/TONGUE/THROAT SWELLING, SOB, OR LIGHTHEADEDNESS WITH HYPOTENSION:  #  #  #  YES  #  #  #   Has patient had a PCN reaction causing severe rash involving mucus membranes or skin necrosis: No Has patient had a PCN reaction that required hospitalization: No Has patient had a PCN reaction occurring within the last 10 years: No If all of the above answers are "NO", then may proceed with Cephalosporin use.    Doxycycline Rash    Review of Systems  Constitutional:  Negative for activity change, appetite change, chills, diaphoresis, fatigue, fever and unexpected weight change.  HENT:  Positive for congestion, postnasal drip, rhinorrhea and sneezing. Negative for sinus pressure.   Eyes:  Positive for discharge, redness and itching. Negative for photophobia, pain and visual disturbance.  Respiratory:  Positive for cough. Negative for apnea, choking, shortness of breath, wheezing and stridor.   Genitourinary:  Negative for decreased urine volume.  Neurological:  Negative for weakness and headaches.  Psychiatric/Behavioral:  Negative for confusion.   All other systems reviewed and are negative.       Observations/Objective: No vital signs or physical exam, this was a telephone or virtual health encounter.  Pt alert and oriented, answers all questions appropriately, and able to speak in full  sentences.    Assessment and Plan: Israa was seen today for allergies.  Diagnoses and all orders for this visit:  Non-seasonal allergic rhinitis due to pollen Symptoms consistent with allergic rhinitis.  No indications of acute bacterial infection.  We will add prednisone and Mucinex to regimen.  Patient aware to continue Flonase and antihistamine at home.  Patient aware to use saline nasal spray throughout the day to help with symptomatic relief.  Patient aware to report any new or worsening symptoms.  Follow-up as needed. -     predniSONE (DELTASONE) 20 MG tablet; Take 2 tablets (40 mg total) by mouth daily with breakfast for 5 days. -     guaiFENesin (MUCINEX) 600 MG 12 hr tablet; Take 1 tablet (600 mg total) by mouth 2 (two) times daily for 10 days.     Follow Up Instructions: Return if symptoms worsen or fail to improve.    I discussed the assessment and treatment plan with the patient. The patient was provided an opportunity to ask questions and all were answered. The patient agreed with the plan and demonstrated an understanding of the instructions.   The patient was advised to call back or seek an in-person evaluation if the symptoms worsen or if the condition fails to improve as anticipated.  The above assessment and management plan was discussed with the patient. The patient verbalized understanding of and has agreed to the management plan. Patient is aware to call the clinic if they develop any new symptoms or if symptoms persist or worsen. Patient is aware when to return to the clinic for a follow-up visit. Patient educated on when it is appropriate to go to the emergency department.    I provided 12 minutes of non-face-to-face time during this encounter. The call started at [phone]. The call ended at 0824. The other time was used for coordination of care.    Monia Pouch, FNP-C [facility] [address] [place], [place] [address] [phone] 12/10/2020

## 2020-12-18 ENCOUNTER — Encounter: Payer: Self-pay | Admitting: Family

## 2020-12-18 ENCOUNTER — Ambulatory Visit (INDEPENDENT_AMBULATORY_CARE_PROVIDER_SITE_OTHER): Payer: Medicare HMO | Admitting: Family

## 2020-12-18 VITALS — BP 133/82 | HR 75 | Temp 97.2°F | Ht 63.0 in | Wt 148.2 lb

## 2020-12-18 DIAGNOSIS — F339 Major depressive disorder, recurrent, unspecified: Secondary | ICD-10-CM | POA: Diagnosis not present

## 2020-12-18 DIAGNOSIS — J410 Simple chronic bronchitis: Secondary | ICD-10-CM | POA: Diagnosis not present

## 2020-12-18 DIAGNOSIS — C50311 Malignant neoplasm of lower-inner quadrant of right female breast: Secondary | ICD-10-CM

## 2020-12-18 DIAGNOSIS — R109 Unspecified abdominal pain: Secondary | ICD-10-CM | POA: Diagnosis not present

## 2020-12-18 DIAGNOSIS — Z170 Estrogen receptor positive status [ER+]: Secondary | ICD-10-CM | POA: Diagnosis not present

## 2020-12-18 DIAGNOSIS — E538 Deficiency of other specified B group vitamins: Secondary | ICD-10-CM

## 2020-12-18 DIAGNOSIS — R69 Illness, unspecified: Secondary | ICD-10-CM | POA: Diagnosis not present

## 2020-12-18 DIAGNOSIS — E785 Hyperlipidemia, unspecified: Secondary | ICD-10-CM

## 2020-12-18 DIAGNOSIS — M546 Pain in thoracic spine: Secondary | ICD-10-CM

## 2020-12-18 DIAGNOSIS — F411 Generalized anxiety disorder: Secondary | ICD-10-CM | POA: Diagnosis not present

## 2020-12-18 DIAGNOSIS — E039 Hypothyroidism, unspecified: Secondary | ICD-10-CM | POA: Diagnosis not present

## 2020-12-18 DIAGNOSIS — Z79899 Other long term (current) drug therapy: Secondary | ICD-10-CM | POA: Diagnosis not present

## 2020-12-18 DIAGNOSIS — G8929 Other chronic pain: Secondary | ICD-10-CM

## 2020-12-18 DIAGNOSIS — M545 Low back pain, unspecified: Secondary | ICD-10-CM | POA: Diagnosis not present

## 2020-12-18 LAB — MICROSCOPIC EXAMINATION
Bacteria, UA: NONE SEEN
Renal Epithel, UA: NONE SEEN /hpf
WBC, UA: NONE SEEN /hpf (ref 0–5)

## 2020-12-18 LAB — URINALYSIS, COMPLETE
Bilirubin, UA: NEGATIVE
Glucose, UA: NEGATIVE
Ketones, UA: NEGATIVE
Leukocytes,UA: NEGATIVE
Nitrite, UA: NEGATIVE
Protein,UA: NEGATIVE
Specific Gravity, UA: >1.03 — ABNORMAL HIGH (ref 1.005–1.030)
Urobilinogen, Ur: 0.2 mg/dL (ref 0.2–1.0)
pH, UA: 5.5 (ref 5.0–7.5)

## 2020-12-18 MED ORDER — ALPRAZOLAM 0.5 MG PO TABS: 0.5000 mg | ORAL_TABLET | Freq: Every evening | ORAL | 1 refills | Status: DC | PRN

## 2020-12-18 MED ORDER — SERTRALINE HCL 100 MG PO TABS: 100.0000 mg | ORAL_TABLET | Freq: Every day | ORAL | 1 refills | Status: DC

## 2020-12-18 NOTE — Progress Notes (Signed)
Subjective:    Patient ID: Jaclyn Schmidt, female    DOB: May 24, 1950, 70 y.o.   MRN: [id number]   Chief Complaint  Patient presents with   Hypertension    PT presents to the office today for chronic follow up. She is followed by Oncologists every 3-4 months for Right Breast cancer. She completed radiation 2020. States she is doing well at this time. She has anal sphincter incompetence and taking Metamucil. She does pelvic floor exercises daily.    She also has Vit B 12 deficiency and gets monthly injections.   She has COPD and states her breathing is stable. She continues to smoke 1/2 pack.  Thyroid Problem Presents for follow-up visit. Symptoms include diarrhea. Patient reports no anxiety, fatigue or hair loss. The symptoms have been stable. Her past medical history is significant for hyperlipidemia.  Back Pain This is a chronic problem. The current episode started more than 1 year ago. The problem occurs intermittently. The problem has been waxing and waning since onset. The pain is present in the lumbar spine. The quality of the pain is described as aching. The pain is at a severity of 8/10. The pain is moderate. The symptoms are aggravated by sitting and standing. Associated symptoms include abdominal pain. She has tried NSAIDs for the symptoms. The treatment provided moderate relief.  Hyperlipidemia This is a chronic problem. The current episode started more than 1 year ago. Current antihyperlipidemic treatment includes statins. The current treatment provides moderate improvement of lipids. Risk factors for coronary artery disease include dyslipidemia, hypertension and a sedentary lifestyle.  Depression        This is a chronic problem.  The current episode started more than 1 year ago.   The onset quality is gradual.   The problem occurs intermittently.  Associated symptoms include restlessness.  Associated symptoms include no fatigue, no helplessness, no hopelessness and not sad.  Past  treatments include SSRIs - Selective serotonin reuptake inhibitors.  Past medical history includes thyroid problem.   Nicotine Dependence Presents for follow-up visit. Symptoms are negative for fatigue. Her urge triggers include company of smokers. The symptoms have been stable. She smokes < 1/2 a pack of cigarettes per day.  Abdominal Pain This is a new problem. The current episode started in the past 7 days. The onset quality is gradual. Pain location: lower abdominal pressure. The pain is at a severity of 8/10. The pain is mild. Associated symptoms include diarrhea.     Review of Systems  Constitutional:  Negative for fatigue.  Gastrointestinal:  Positive for abdominal pain and diarrhea.  Psychiatric/Behavioral:  Positive for depression. The patient is not nervous/anxious.   All other systems reviewed and are negative.     Objective:   Physical Exam Vitals reviewed.  Constitutional:      General: She is not in acute distress.    Appearance: She is well-developed.  HENT:     Head: Normocephalic and atraumatic.     Right Ear: Tympanic membrane normal.     Left Ear: Tympanic membrane normal.  Eyes:     Pupils: Pupils are equal, round, and reactive to light.  Neck:     Thyroid: No thyromegaly.  Cardiovascular:     Rate and Rhythm: Normal rate and regular rhythm.     Heart sounds: Normal heart sounds. No murmur heard. Pulmonary:     Effort: Pulmonary effort is normal. No respiratory distress.     Breath sounds: Normal breath sounds. No wheezing.  Abdominal:     General: Bowel sounds are normal. There is no distension.     Palpations: Abdomen is soft.     Tenderness: There is abdominal tenderness (mild lower abdominal).  Musculoskeletal:        General: No tenderness. Normal range of motion.     Cervical back: Normal range of motion and neck supple.  Skin:    General: Skin is warm and dry.  Neurological:     Mental Status: She is alert and oriented to person, place, and  time.     Cranial Nerves: No cranial nerve deficit.     Deep Tendon Reflexes: Reflexes are normal and symmetric.  Psychiatric:        Behavior: Behavior normal.        Thought Content: Thought content normal.        Judgment: Judgment normal.     BP 133/82   Pulse 75   Temp (!) 97.2 F (36.2 C) (Temporal)   Ht '5\' 3"'  (1.6 m)   Wt 148 lb 3.2 oz (67.2 kg)   BMI 26.25 kg/m       Assessment & Plan:  Jaclyn Schmidt comes in today with chief complaint of Hypertension   Diagnosis and orders addressed:  1. Hypothyroidism, unspecified type - CMP14+EGFR - CBC with Differential/Platelet - TSH  2. Chronic midline thoracic back pain - CMP14+EGFR - CBC with Differential/Platelet  3. Lumbar pain - CMP14+EGFR - CBC with Differential/Platelet  4. Depression, recurrent ([place]) Will increase Zoloft to 100 mg from 50 mg  Stress management  - CMP14+EGFR - CBC with Differential/Platelet - sertraline (ZOLOFT) 100 MG tablet; Take 1 tablet (100 mg total) by mouth daily.  Dispense: 90 tablet; Refill: 1  5. Malignant neoplasm of lower-inner quadrant of right breast of female, estrogen receptor positive ([place]) - CMP14+EGFR - CBC with Differential/Platelet  6. Vitamin B 12 deficiency - CMP14+EGFR - CBC with Differential/Platelet  7. Hyperlipidemia, unspecified hyperlipidemia type - CMP14+EGFR - CBC with Differential/Platelet  8. Simple chronic bronchitis ([facility]) - CMP14+EGFR - CBC with Differential/Platelet  9. GAD (generalized anxiety disorder) - CMP14+EGFR - CBC with Differential/Platelet - sertraline (ZOLOFT) 100 MG tablet; Take 1 tablet (100 mg total) by mouth daily.  Dispense: 90 tablet; Refill: 1 - ALPRAZolam (XANAX) 0.5 MG tablet; Take 1 tablet (0.5 mg total) by mouth at bedtime as needed for anxiety.  Dispense: 20 tablet; Refill: 1  10. Controlled substance agreement signed - CMP14+EGFR - CBC with Differential/Platelet - ALPRAZolam (XANAX) 0.5 MG tablet; Take 1 tablet  (0.5 mg total) by mouth at bedtime as needed for anxiety.  Dispense: 20 tablet; Refill: 1  11. Abdominal pressure  - CMP14+EGFR - CBC with Differential/Platelet - Urinalysis, Complete - Urine Culture   Labs pending Patient reviewed in [place] controlled database, no flags noted. Contract and drug screen are up to date.  Health Maintenance reviewed Diet and exercise encouraged  Follow up plan: 3 months    Evelina Dun, FNP

## 2020-12-18 NOTE — Patient Instructions (Signed)

## 2020-12-19 LAB — CMP14+EGFR
ALT: 9 IU/L (ref 0–32)
AST: 12 IU/L (ref 0–40)
Albumin/Globulin Ratio: 2 (ref 1.2–2.2)
Albumin: 4.1 g/dL (ref 3.8–4.8)
Alkaline Phosphatase: 86 IU/L (ref 44–121)
BUN/Creatinine Ratio: 19 (ref 12–28)
BUN: 14 mg/dL (ref 8–27)
Bilirubin Total: 0.2 mg/dL (ref 0.0–1.2)
CO2: 24 mmol/L (ref 20–29)
Calcium: 9.1 mg/dL (ref 8.7–10.3)
Chloride: 103 mmol/L (ref 96–106)
Creatinine, Ser: 0.72 mg/dL (ref 0.57–1.00)
Globulin, Total: 2.1 g/dL (ref 1.5–4.5)
Glucose: 92 mg/dL (ref 70–99)
Potassium: 3.8 mmol/L (ref 3.5–5.2)
Sodium: 142 mmol/L (ref 134–144)
Total Protein: 6.2 g/dL (ref 6.0–8.5)
eGFR: 90 mL/min/{1.73_m2} (ref >59)

## 2020-12-19 LAB — CBC WITH DIFFERENTIAL/PLATELET
Basophils Absolute: 0 10*3/uL (ref 0.0–0.2)
Basos: 1 %
EOS (ABSOLUTE): 0.2 10*3/uL (ref 0.0–0.4)
Eos: 3 %
Hematocrit: 40.4 % (ref 34.0–46.6)
Hemoglobin: 13.8 g/dL (ref 11.1–15.9)
Immature Grans (Abs): 0.2 10*3/uL — ABNORMAL HIGH (ref 0.0–0.1)
Immature Granulocytes: 3 %
Lymphocytes Absolute: 2.3 10*3/uL (ref 0.7–3.1)
Lymphs: 42 %
MCH: 29.7 pg (ref 26.6–33.0)
MCHC: 34.2 g/dL (ref 31.5–35.7)
MCV: 87 fL (ref 79–97)
Monocytes Absolute: 0.7 10*3/uL (ref 0.1–0.9)
Monocytes: 13 %
Neutrophils Absolute: 2 10*3/uL (ref 1.4–7.0)
Neutrophils: 38 %
Platelets: 289 10*3/uL (ref 150–450)
RBC: 4.64 x10E6/uL (ref 3.77–5.28)
RDW: 12.1 % (ref 11.7–15.4)
WBC: 5.3 10*3/uL (ref 3.4–10.8)

## 2020-12-19 LAB — TSH: TSH: 3.37 u[IU]/mL (ref 0.450–4.500)

## 2020-12-20 LAB — URINE CULTURE

## 2020-12-23 ENCOUNTER — Encounter: Payer: Self-pay | Admitting: Dermatology

## 2020-12-23 ENCOUNTER — Ambulatory Visit: Payer: Medicare HMO | Admitting: Dermatology

## 2020-12-23 ENCOUNTER — Other Ambulatory Visit: Payer: Self-pay

## 2020-12-23 DIAGNOSIS — L570 Actinic keratosis: Secondary | ICD-10-CM

## 2020-12-23 DIAGNOSIS — Z1283 Encounter for screening for malignant neoplasm of skin: Secondary | ICD-10-CM | POA: Diagnosis not present

## 2020-12-23 DIAGNOSIS — L821 Other seborrheic keratosis: Secondary | ICD-10-CM

## 2020-12-29 DIAGNOSIS — Z853 Personal history of malignant neoplasm of breast: Secondary | ICD-10-CM | POA: Diagnosis not present

## 2020-12-29 DIAGNOSIS — K625 Hemorrhage of anus and rectum: Secondary | ICD-10-CM | POA: Diagnosis not present

## 2020-12-29 DIAGNOSIS — Z79899 Other long term (current) drug therapy: Secondary | ICD-10-CM | POA: Diagnosis not present

## 2020-12-29 DIAGNOSIS — R69 Illness, unspecified: Secondary | ICD-10-CM | POA: Diagnosis not present

## 2020-12-29 DIAGNOSIS — K641 Second degree hemorrhoids: Secondary | ICD-10-CM | POA: Diagnosis not present

## 2020-12-29 DIAGNOSIS — K644 Residual hemorrhoidal skin tags: Secondary | ICD-10-CM | POA: Diagnosis not present

## 2020-12-29 DIAGNOSIS — Z79811 Long term (current) use of aromatase inhibitors: Secondary | ICD-10-CM | POA: Diagnosis not present

## 2020-12-29 DIAGNOSIS — K5730 Diverticulosis of large intestine without perforation or abscess without bleeding: Secondary | ICD-10-CM | POA: Diagnosis not present

## 2020-12-29 DIAGNOSIS — Q438 Other specified congenital malformations of intestine: Secondary | ICD-10-CM | POA: Diagnosis not present

## 2021-01-05 DIAGNOSIS — E538 Deficiency of other specified B group vitamins: Secondary | ICD-10-CM | POA: Diagnosis not present

## 2021-01-05 DIAGNOSIS — Z79811 Long term (current) use of aromatase inhibitors: Secondary | ICD-10-CM | POA: Diagnosis not present

## 2021-01-05 DIAGNOSIS — M8580 Other specified disorders of bone density and structure, unspecified site: Secondary | ICD-10-CM | POA: Diagnosis not present

## 2021-01-05 DIAGNOSIS — C50511 Malignant neoplasm of lower-outer quadrant of right female breast: Secondary | ICD-10-CM | POA: Diagnosis not present

## 2021-01-05 DIAGNOSIS — Z170 Estrogen receptor positive status [ER+]: Secondary | ICD-10-CM | POA: Diagnosis not present

## 2021-01-06 DIAGNOSIS — R102 Pelvic and perineal pain: Secondary | ICD-10-CM | POA: Diagnosis not present

## 2021-01-11 NOTE — Progress Notes (Signed)
° °  New Patient   Subjective  Jaclyn Schmidt is a 70 y.o. female who presents for the following: Annual Exam (Here for skin exam. Concerns nose/face. No history of skin cancers. ).  General skin examination, several spots that concern her Location:  Duration:  Quality:  Associated Signs/Symptoms: Modifying Factors:  Severity:  Timing: Context:    The following portions of the chart were reviewed this encounter and updated as appropriate:  Tobacco   Allergies   Meds   Problems   Med Hx   Surg Hx   Fam Hx       Objective  Well appearing patient in no apparent distress; mood and affect are within normal limits. Full body skin examination, no atypical pigmented lesions or nonmelanoma skin cancer.  Mid Supratip of Nose 2 mm gritty pink crust  Right Hip 3 to 6 mm flattopped tan to brown textured papules with typical dermoscopy, hip plus multiple on the back    A full examination was performed including scalp, head, eyes, ears, nose, lips, neck, chest, axillae, abdomen, back, buttocks, bilateral upper extremities, bilateral lower extremities, hands, feet, fingers, toes, fingernails, and toenails. All findings within normal limits unless otherwise noted below.  Areas beneath undergarments not fully examined.   Assessment & Plan  Screening exam for skin cancer  Annual skin examination, encouraged to self examine twice annually  Actinic keratosis Mid Supratip of Nose  If spot does not clear with freezing, encouraged to return for biopsy  Destruction of lesion - Mid Supratip of Nose Complexity: simple   Destruction method: cryotherapy   Informed consent: discussed and consent obtained   Lesion destroyed using liquid nitrogen: Yes   Cryotherapy cycles:  3 Outcome: patient tolerated procedure well with no complications   Post-procedure details: wound care instructions given    Seborrheic keratosis Right Hip  Benign, ok to leave

## 2021-01-20 ENCOUNTER — Other Ambulatory Visit: Payer: Self-pay

## 2021-01-20 ENCOUNTER — Ambulatory Visit (INDEPENDENT_AMBULATORY_CARE_PROVIDER_SITE_OTHER): Payer: Medicare HMO | Admitting: *Deleted

## 2021-01-20 DIAGNOSIS — E538 Deficiency of other specified B group vitamins: Secondary | ICD-10-CM

## 2021-01-20 NOTE — Progress Notes (Signed)
Patient in today for B 12 injection. Injection given IM in left deltoid. Patient tolerated well.

## 2021-01-26 ENCOUNTER — Ambulatory Visit: Payer: Medicare HMO

## 2021-01-27 ENCOUNTER — Ambulatory Visit (INDEPENDENT_AMBULATORY_CARE_PROVIDER_SITE_OTHER): Payer: Medicare HMO | Admitting: *Deleted

## 2021-01-27 ENCOUNTER — Other Ambulatory Visit: Payer: Self-pay

## 2021-01-27 DIAGNOSIS — Z23 Encounter for immunization: Secondary | ICD-10-CM

## 2021-01-31 DIAGNOSIS — R52 Pain, unspecified: Secondary | ICD-10-CM | POA: Diagnosis not present

## 2021-01-31 DIAGNOSIS — U071 COVID-19: Secondary | ICD-10-CM | POA: Diagnosis not present

## 2021-01-31 DIAGNOSIS — J329 Chronic sinusitis, unspecified: Secondary | ICD-10-CM | POA: Diagnosis not present

## 2021-01-31 DIAGNOSIS — R059 Cough, unspecified: Secondary | ICD-10-CM | POA: Diagnosis not present

## 2021-02-01 ENCOUNTER — Other Ambulatory Visit: Payer: Self-pay | Admitting: Family

## 2021-02-01 DIAGNOSIS — F339 Major depressive disorder, recurrent, unspecified: Secondary | ICD-10-CM

## 2021-02-13 ENCOUNTER — Other Ambulatory Visit: Payer: Self-pay | Admitting: Family

## 2021-02-22 ENCOUNTER — Other Ambulatory Visit: Payer: Self-pay | Admitting: Family

## 2021-02-22 DIAGNOSIS — E785 Hyperlipidemia, unspecified: Secondary | ICD-10-CM

## 2021-03-03 ENCOUNTER — Ambulatory Visit (INDEPENDENT_AMBULATORY_CARE_PROVIDER_SITE_OTHER): Payer: Medicare HMO | Admitting: *Deleted

## 2021-03-03 DIAGNOSIS — E538 Deficiency of other specified B group vitamins: Secondary | ICD-10-CM | POA: Diagnosis not present

## 2021-03-03 NOTE — Progress Notes (Signed)
Patient in today for monthly B12 injection. 1000 mcg administered in left deltoid. Patient tolerated well.

## 2021-03-04 ENCOUNTER — Telehealth: Payer: Self-pay | Admitting: Family

## 2021-03-04 MED ORDER — MELOXICAM 15 MG PO TABS: 15.0000 mg | ORAL_TABLET | Freq: Every day | ORAL | 1 refills | Status: DC

## 2021-03-04 NOTE — Telephone Encounter (Signed)
°  Prescription Request  03/04/2021  Is this a "Controlled Substance" medicine? no  Have you seen your PCP in the last 2 weeks? no  If YES, route message to pool  -  If NO, patient needs to be scheduled for appointment.  What is the name of the medication or equipment? meloxicam (MOBIC) 15 MG tablet  Have you contacted your pharmacy to request a refill? no   Which pharmacy would you like this sent to? [facility] in [place]   Patient notified that their request is being sent to the clinical staff for review and that they should receive a response within 2 business days.

## 2021-03-04 NOTE — Telephone Encounter (Signed)
Refill sent to pharmacy, patient aware ?

## 2021-03-17 ENCOUNTER — Other Ambulatory Visit: Payer: Self-pay

## 2021-03-17 ENCOUNTER — Ambulatory Visit (INDEPENDENT_AMBULATORY_CARE_PROVIDER_SITE_OTHER): Payer: Medicare HMO

## 2021-03-17 VITALS — Wt 147.0 lb

## 2021-03-17 DIAGNOSIS — Z0000 Encounter for general adult medical examination without abnormal findings: Secondary | ICD-10-CM | POA: Diagnosis not present

## 2021-03-17 MED ORDER — TRIAMCINOLONE ACETONIDE 0.5 % EX OINT: 1.0000 "application " | TOPICAL_OINTMENT | Freq: Two times a day (BID) | CUTANEOUS | 2 refills | Status: DC

## 2021-03-17 NOTE — Progress Notes (Signed)
Subjective:   Jaclyn Schmidt is a 71 y.o. female who presents for Medicare Annual (Subsequent) preventive examination.  Virtual Visit via Telephone Note  I connected with  Jaclyn Schmidt on 03/17/21 at  8:15 AM EST by telephone and verified that I am speaking with the correct person using two identifiers.  Location: Patient: Home Provider: [facility] Persons participating in the virtual visit: patient/Nurse Health Advisor   I discussed the limitations, risks, security and privacy concerns of performing an evaluation and management service by telephone and the availability of in person appointments. The patient expressed understanding and agreed to proceed.  Interactive audio and video telecommunications were attempted between this nurse and patient, however failed, due to patient having technical difficulties OR patient did not have access to video capability.  We continued and completed visit with audio only.  Some vital signs may be absent or patient reported.   Telesha Deguzman E Michele Judy, LPN   Review of Systems     Cardiac Risk Factors include: advanced age (>59men, >62 women);dyslipidemia;Other (see comment);smoking/ tobacco exposure, Risk factor comments: COPD     Objective:    Today's Vitals   03/17/21 0818  Weight: 147 lb (66.7 kg)  PainSc: 4    Body mass index is 26.04 kg/m.  Advanced Directives 03/17/2021 03/14/2020 01/12/2020 03/13/2019 09/13/2018 05/30/2018 05/24/2018  Does Patient Have a Medical Advance Directive? No No No No No No No  Type of Advance Directive - - - - - - -  Would patient like information on creating a medical advance directive? No - Patient declined No - Patient declined - No - Patient declined No - Patient declined No - Patient declined No - Patient declined  Pre-existing out of facility DNR order (yellow form or pink MOST form) - - - - - - -    Current Medications (verified) Outpatient Encounter Medications as of 03/17/2021  Medication Sig   ALPRAZolam (XANAX)  0.5 MG tablet Take 1 tablet (0.5 mg total) by mouth at bedtime as needed for anxiety.   anastrozole (ARIMIDEX) 1 MG tablet Take 1 tablet by mouth daily.   BIOTIN PO Take 2 tablets by mouth daily.   calcium carbonate (OSCAL) 1500 (600 Ca) MG TABS tablet Take by mouth 2 (two) times daily with a meal.   cetirizine (ZYRTEC) 10 MG tablet Take 10 mg by mouth daily.   Cholecalciferol (VITAMIN D3 PO) Take 4,000 Int'l Units by mouth daily.   fluticasone (FLONASE) 50 MCG/ACT nasal spray PLACE 2 SPRAYS INTO BOTH NOSTRILS DAILY AS NEEDED (FOR ALLERGIES.).   gabapentin (NEURONTIN) 300 MG capsule Take 600 mg in AM and 600 mg in the evening   levothyroxine (SYNTHROID) 75 MCG tablet Take 1 tablet (75 mcg total) by mouth daily.   meclizine (ANTIVERT) 25 MG tablet TAKE 1 TABLET BY MOUTH 3 TIMES DAILY AS NEEDED FOR DIZZINESS.   meloxicam (MOBIC) 15 MG tablet Take 1 tablet (15 mg total) by mouth daily.   pravastatin (PRAVACHOL) 40 MG tablet TAKE 1 TABLET BY MOUTH EVERY DAY   rosuvastatin (CRESTOR) 10 MG tablet Take 1 tablet (10 mg total) by mouth daily.   sertraline (ZOLOFT) 100 MG tablet Take 1 tablet (100 mg total) by mouth daily.   triamcinolone ointment (KENALOG) 0.5 % Apply 1 application topically 2 (two) times daily.   [DISCONTINUED] SHINGRIX injection    Facility-Administered Encounter Medications as of 03/17/2021  Medication   cyanocobalamin ((VITAMIN B-12)) injection 1,000 mcg    Allergies (verified) Penicillins and Doxycycline  History: Past Medical History:  Diagnosis Date   Anxiety    Breast cancer ([place]) 05/2018   Right Breast Cancer   Cancer ([place]) 04/2018   right breast cancer   Cholelithiasis    Depression    GERD (gastroesophageal reflux disease)    History of kidney stones    Hyperlipidemia    Hypothyroidism    Irritable bowel syndrome    Personal history of radiation therapy 2020   Right Breast Cancer   Pneumonia    Thyroid disease    Vitamin D deficiency    Past Surgical  History:  Procedure Laterality Date   ABDOMINAL HYSTERECTOMY  [id number]   ANTERIOR CERVICAL DECOMP/DISCECTOMY FUSION N/A 01/21/2017   Procedure: C3-4, C4-5 ANTERIOR CERVICAL DISCECTOMY AND FUSION, ALLOGRAFT, PLATE;  Surgeon: [name], MD;  Location: [facility];  Service: Orthopedics;  Laterality: N/A;   APPENDECTOMY  1996   BREAST LUMPECTOMY Right 05/30/2018   BREAST LUMPECTOMY WITH RADIOACTIVE SEED AND SENTINEL LYMPH NODE BIOPSY Right 05/30/2018   Procedure: RIGHT BREAST LUMPECTOMY WITH RADIOACTIVE SEED AND RIGHT AXILLARY SENTINEL LYMPH NODE BIOPSY;  Surgeon: Alphonsa Overall, MD;  Location: [facility];  Service: General;  Laterality: Right;   BREAST MASS EXCISION  1997   left   CHOLECYSTECTOMY  07/15/2011   Procedure: LAPAROSCOPIC CHOLECYSTECTOMY WITH INTRAOPERATIVE CHOLANGIOGRAM;  Surgeon: Shann Medal, MD;  Location: [facility];  Service: General;  Laterality: N/A;  Cholecystectomy with IOC   TOE FUSION     TUBAL LIGATION  1980   Family History  Problem Relation Age of Onset   Cancer Mother        stomach   Heart disease Father    Cancer Maternal Aunt        ovarian   Cancer Paternal Aunt        breast   Diabetes Brother    Hypertension Brother    Social History   Socioeconomic History   Marital status: Significant Other    Spouse name: Not on file   Number of children: 1   Years of education: 12   Highest education level: High school graduate  Occupational History   Occupation: Retired     Fish farm manager: [employer]   Occupation: Retired    Comment: Actuary  Tobacco Use   Smoking status: Some Days    Packs/day: 0.25    Years: 36.00    Pack years: 9.00    Types: Cigarettes    Start date: 09/30/1973    Last attempt to quit: 04/26/2018    Years since quitting: 2.8   Smokeless tobacco: Never   Tobacco comments:    a pack per month  Vaping Use   Vaping Use: Never used  Substance and Sexual Activity   Alcohol use: No    Alcohol/week: 0.0 standard drinks   Drug use:  No   Sexual activity: Not on file  Other Topics Concern   Not on file  Social History Narrative   3 grandchildren. Enjoys dancing and going out to eat.    Social Determinants of Health   Financial Resource Strain: Low Risk    Difficulty of Paying Living Expenses: Not hard at all  Food Insecurity: No Food Insecurity   Worried About Charity fundraiser in the Last Year: Never true   [place] in the Last Year: Never true  Transportation Needs: No Transportation Needs   Lack of Transportation (Medical): No   Lack of Transportation (Non-Medical): No  Physical  Activity: Sufficiently Active   Days of Exercise per Week: 5 days   Minutes of Exercise per Session: 30 min  Stress: No Stress Concern Present   Feeling of Stress : Not at all  Social Connections: Moderately Integrated   Frequency of Communication with Friends and Family: More than three times a week   Frequency of Social Gatherings with Friends and Family: More than three times a week   Attends Religious Services: More than 4 times per year   Active Member of [employer] or Organizations: Yes   Attends Music therapist: More than 4 times per year   Marital Status: Divorced    Tobacco Counseling Ready to quit: Not Answered Counseling given: Not Answered Tobacco comments: a pack per month   Clinical Intake:  Pre-visit preparation completed: Yes  Pain : 0-10 Pain Score: 4  Pain Type: Chronic pain Pain Location: Back Pain Orientation: Lower, Mid Pain Descriptors / Indicators: Aching, Discomfort Pain Onset: More than a month ago Pain Frequency: Intermittent     BMI - recorded: 26.04 Nutritional Status: BMI 25 -29 Overweight Nutritional Risks: None Diabetes: No  How often do you need to have someone help you when you read instructions, pamphlets, or other written materials from your doctor or pharmacy?: 1 - Never  Diabetic? no  Interpreter Needed?: No  Information entered by :: Breon Rehm,  LPN   Activities of Daily Living In your present state of health, do you have any difficulty performing the following activities: 03/17/2021  Hearing? N  Vision? N  Difficulty concentrating or making decisions? N  Walking or climbing stairs? N  Dressing or bathing? N  Doing errands, shopping? N  Preparing Food and eating ? N  Using the Toilet? N  In the past six months, have you accidently leaked urine? N  Do you have problems with loss of bowel control? Y  Comment no rectal muscles since giving birth - has tried therapy and exercises  Managing your Medications? N  Managing your Finances? N  Housekeeping or managing your Housekeeping? N  Some recent data might be hidden    Patient Care Team: Sharion Balloon, FNP as PCP - General (Family Medicine) [name], PA-C as Referring Physician (General Practice) [name], MD as Consulting Physician (Orthopedic Surgery) [name], RN as Oncology Nurse Navigator [name], RN as Oncology Nurse Navigator [name], MD as Consulting Physician (Radiation Oncology) Ala Dach, MD as Referring Physician (Hematology and Oncology) Gari Crown, MD as Referring Physician (Obstetrics and Gynecology) Lavonna Monarch, MD as Consulting Physician (Dermatology) Kerry Kass, MD as Referring Physician (Surgery)  Indicate any recent Medical Services you may have received from other than [facility] providers in the past year (date may be approximate).     Assessment:   This is a routine wellness examination for Jaclyn Schmidt.  Hearing/Vision screen Hearing Screening - Comments:: Denies hearing difficulties   Vision Screening - Comments:: Wears rx glasses or contact lenses - up to date with routine eye exams with [facility]  Dietary issues and exercise activities discussed: Current Exercise Habits: Home exercise routine, Type of exercise: walking;strength training/weights;calisthenics, Time (Minutes): 30,  Frequency (Times/Week): 5, Weekly Exercise (Minutes/Week): 150, Intensity: Mild, Exercise limited by: orthopedic condition(s);respiratory conditions(s)   Goals Addressed             This Visit's Progress    Exercise 3x per week (30 min per time)   On track  Depression Screen PHQ 2/9 Scores 03/17/2021 12/18/2020 09/02/2020 06/20/2020 03/14/2020 03/06/2020 01/22/2020  PHQ - 2 Score 0 0 0 0 0 0 0  PHQ- 9 Score - 1 0 2 - - -    Fall Risk Fall Risk  03/17/2021 06/20/2020 03/14/2020 03/06/2020 01/22/2020  Falls in the past year? 0 0 0 0 0  Number falls in past yr: 0 - - - -  Injury with Fall? 0 - - - -  Risk for fall due to : Medication side effect;Orthopedic patient - - - -  Follow up Falls prevention discussed - - - -    FALL RISK PREVENTION PERTAINING TO THE HOME:  Any stairs in or around the home? Yes  If so, are there any without handrails? No  Home free of loose throw rugs in walkways, pet beds, electrical cords, etc? Yes  Adequate lighting in your home to reduce risk of falls? Yes   ASSISTIVE DEVICES UTILIZED TO PREVENT FALLS:  Life alert? No  Use of a cane, walker or w/c? No  Grab bars in the bathroom? Yes  Shower chair or bench in shower? No  Elevated toilet seat or a handicapped toilet? No   TIMED UP AND GO:  Was the test performed? No . Telephonic visit  Cognitive Function: Normal cognitive status assessed by direct observation by this Nurse Health Advisor. No abnormalities found.   MMSE - Mini Mental State Exam 02/07/2018  Orientation to time 5  Orientation to Place 5  Registration 3  Attention/ Calculation 5  Recall 2  Language- name 2 objects 2  Language- repeat 1  Language- follow 3 step command 3  Language- read & follow direction 1  Write a sentence 1  Copy design 1  Total score 29     6CIT Screen 03/17/2021 03/14/2020 03/13/2019  What Year? 0 points 0 points 0 points  What month? 0 points 0 points 0 points  What time? 0 points 0 points 0 points  Count  back from 20 0 points 0 points 0 points  Months in reverse 0 points 0 points 0 points  Repeat phrase 2 points 0 points 0 points  Total Score 2 0 0    Immunizations Immunization History  Administered Date(s) Administered   Fluad Quad(high Dose 65+) 10/26/2019, 10/26/2019, 11/03/2020   Influenza Inj Mdck Quad Pf 10/19/2016   Influenza, High Dose Seasonal PF 11/07/2017, 11/07/2017, 10/09/2018   Influenza-Unspecified 10/30/2015   Moderna Sars-Covid-2 Vaccination 02/21/2019, 03/22/2019, 12/11/2019   Pneumococcal Conjugate-13 11/07/2017   Pneumococcal Polysaccharide-23 06/26/2019   Tdap 02/19/2020   Zoster Recombinat (Shingrix) 08/27/2020, 01/27/2021   Zoster, Live 10/29/2014    TDAP status: Up to date  Flu Vaccine status: Up to date  Pneumococcal vaccine status: Up to date  Covid-19 vaccine status: Completed vaccines  Qualifies for Shingles Vaccine? Yes   Zostavax completed Yes   Shingrix Completed?: Yes  Screening Tests Health Maintenance  Topic Date Due   COVID-19 Vaccine (4 - Booster for Moderna series) 02/05/2020   DEXA SCAN  03/29/2021   MAMMOGRAM  05/07/2021   TETANUS/TDAP  02/18/2030   COLONOSCOPY (Pts 45-38yrs Insurance coverage will need to be confirmed)  12/30/2030   Pneumonia Vaccine 47+ Years old  Completed   INFLUENZA VACCINE  Completed   Hepatitis C Screening  Completed   Zoster Vaccines- Shingrix  Completed   HPV VACCINES  Aged Out    Health Maintenance  Health Maintenance Due  Topic Date Due   COVID-19 Vaccine (4 -  Booster for Moderna series) 02/05/2020    Colorectal cancer screening: Type of screening: Colonoscopy. Completed 12/29/2020. Repeat every 10 years  Mammogram status: Completed 05/27/2020. Repeat every year  Bone Density status: Completed 03/30/2019. Results reflect: Bone density results: OSTEOPENIA. Repeat every 2 years.  Lung Cancer Screening: (Low Dose CT Chest recommended if Age 50-80 years, 30 pack-year currently smoking OR have  quit w/in 15years.) does not qualify.  Additional Screening:  Hepatitis C Screening: does qualify; Completed 06/26/2019  Vision Screening: Recommended annual ophthalmology exams for early detection of glaucoma and other disorders of the eye. Is the patient up to date with their annual eye exam?  Yes  Who is the provider or what is the name of the office in which the patient attends annual eye exams? [facility] If pt is not established with a provider, would they like to be referred to a provider to establish care? No .   Dental Screening: Recommended annual dental exams for proper oral hygiene  Community Resource Referral / Chronic Care Management: CRR required this visit?  No   CCM required this visit?  No      Plan:     I have personally reviewed and noted the following in the patients chart:   Medical and social history Use of alcohol, tobacco or illicit drugs  Current medications and supplements including opioid prescriptions.  Functional ability and status Nutritional status Physical activity Advanced directives List of other physicians Hospitalizations, surgeries, and ER visits in previous 12 months Vitals Screenings to include cognitive, depression, and falls Referrals and appointments  In addition, I have reviewed and discussed with patient certain preventive protocols, quality metrics, and best practice recommendations. A written personalized care plan for preventive services as well as general preventive health recommendations were provided to patient.   Due to this being a telephonic visit, the after visit summary with patients personalized plan was offered to patient via mail or my-chart. Patient would like to access on my-chart   Sandrea Hammond, LPN   9/89/2119   Nurse Notes: None

## 2021-03-17 NOTE — Patient Instructions (Signed)
[name] , Thank you for taking time to come for your Medicare Wellness Visit. I appreciate your ongoing commitment to your health goals. Please review the following plan we discussed and let me know if I can assist you in the future.   Screening recommendations/referrals: Colonoscopy: Done 12/29/2020 - Dr Ladona Horns in [facility] - Repeat in 10 years Mammogram: Done 05/27/2020 - Repeat annually  Bone Density: Done 03/30/2019 - Repeat every 2 years  Recommended yearly ophthalmology/optometry visit for glaucoma screening and checkup Recommended yearly dental visit for hygiene and checkup  Vaccinations: Influenza vaccine: Done 11/03/2020 - Repeat annually  Pneumococcal vaccine: Done 11/07/2017 & 06/26/2019 Tdap vaccine: Done 02/19/2020 - Repeat in 10 years Shingles vaccine: Done 08/27/2020 & 01/27/2021   Covid-19:Done 02/21/2019, 03/22/2019, & 12/11/2019 - declines additional boosters  Advanced directives: Advance directive discussed with you today. Even though you declined this today, please call our office should you change your mind, and we can give you the proper paperwork for you to fill out.   Conditions/risks identified: Aim for 30 minutes of exercise or brisk walking, 6-8 glasses of water, and 5 servings of fruits and vegetables each day.   Next appointment: Follow up in one year for your annual wellness visit    Preventive Care 65 Years and Older, Female Preventive care refers to lifestyle choices and visits with your health care provider that can promote health and wellness. What does preventive care include? A yearly physical exam. This is also called an annual well check. Dental exams once or twice a year. Routine eye exams. Ask your health care provider how often you should have your eyes checked. Personal lifestyle choices, including: Daily care of your teeth and gums. Regular physical activity. Eating a healthy diet. Avoiding tobacco and drug use. Limiting alcohol use. Practicing safe  sex. Taking low-dose aspirin every day. Taking vitamin and mineral supplements as recommended by your health care provider. What happens during an annual well check? The services and screenings done by your health care provider during your annual well check will depend on your age, overall health, lifestyle risk factors, and family history of disease. Counseling  Your health care provider may ask you questions about your: Alcohol use. Tobacco use. Drug use. Emotional well-being. Home and relationship well-being. Sexual activity. Eating habits. History of falls. Memory and ability to understand (cognition). Work and work Statistician. Reproductive health. Screening  You may have the following tests or measurements: Height, weight, and BMI. Blood pressure. Lipid and cholesterol levels. These may be checked every 5 years, or more frequently if you are over 1 years old. Skin check. Lung cancer screening. You may have this screening every year starting at age 23 if you have a 30-pack-year history of smoking and currently smoke or have quit within the past 15 years. Fecal occult blood test (FOBT) of the stool. You may have this test every year starting at age 57. Flexible sigmoidoscopy or colonoscopy. You may have a sigmoidoscopy every 5 years or a colonoscopy every 10 years starting at age 78. Hepatitis C blood test. Hepatitis B blood test. Sexually transmitted disease (STD) testing. Diabetes screening. This is done by checking your blood sugar (glucose) after you have not eaten for a while (fasting). You may have this done every 1-3 years. Bone density scan. This is done to screen for osteoporosis. You may have this done starting at age 37. Mammogram. This may be done every 1-2 years. Talk to your health care provider about how often you  should have regular mammograms. Talk with your health care provider about your test results, treatment options, and if necessary, the need for more  tests. Vaccines  Your health care provider may recommend certain vaccines, such as: Influenza vaccine. This is recommended every year. Tetanus, diphtheria, and acellular pertussis (Tdap, Td) vaccine. You may need a Td booster every 10 years. Zoster vaccine. You may need this after age 43. Pneumococcal 13-valent conjugate (PCV13) vaccine. One dose is recommended after age 17. Pneumococcal polysaccharide (PPSV23) vaccine. One dose is recommended after age 62. Talk to your health care provider about which screenings and vaccines you need and how often you need them. This information is not intended to replace advice given to you by your health care provider. Make sure you discuss any questions you have with your health care provider. Document Released: 01/31/2015 Document Revised: 09/24/2015 Document Reviewed: 11/05/2014 Elsevier Interactive Patient Education  2017 [place] Prevention in the Home Falls can cause injuries. They can happen to people of all ages. There are many things you can do to make your home safe and to help prevent falls. What can I do on the outside of my home? Regularly fix the edges of walkways and driveways and fix any cracks. Remove anything that might make you trip as you walk through a door, such as a raised step or threshold. Trim any bushes or trees on the path to your home. Use bright outdoor lighting. Clear any walking paths of anything that might make someone trip, such as rocks or tools. Regularly check to see if handrails are loose or broken. Make sure that both sides of any steps have handrails. Any raised decks and porches should have guardrails on the edges. Have any leaves, snow, or ice cleared regularly. Use sand or salt on walking paths during winter. Clean up any spills in your garage right away. This includes oil or grease spills. What can I do in the bathroom? Use night lights. Install grab bars by the toilet and in the tub and shower.  Do not use towel bars as grab bars. Use non-skid mats or decals in the tub or shower. If you need to sit down in the shower, use a plastic, non-slip stool. Keep the floor dry. Clean up any water that spills on the floor as soon as it happens. Remove soap buildup in the tub or shower regularly. Attach bath mats securely with double-sided non-slip rug tape. Do not have throw rugs and other things on the floor that can make you trip. What can I do in the bedroom? Use night lights. Make sure that you have a light by your bed that is easy to reach. Do not use any sheets or blankets that are too big for your bed. They should not hang down onto the floor. Have a firm chair that has side arms. You can use this for support while you get dressed. Do not have throw rugs and other things on the floor that can make you trip. What can I do in the kitchen? Clean up any spills right away. Avoid walking on wet floors. Keep items that you use a lot in easy-to-reach places. If you need to reach something above you, use a strong step stool that has a grab bar. Keep electrical cords out of the way. Do not use floor polish or wax that makes floors slippery. If you must use wax, use non-skid floor wax. Do not have throw rugs and other things on the  floor that can make you trip. What can I do with my stairs? Do not leave any items on the stairs. Make sure that there are handrails on both sides of the stairs and use them. Fix handrails that are broken or loose. Make sure that handrails are as long as the stairways. Check any carpeting to make sure that it is firmly attached to the stairs. Fix any carpet that is loose or worn. Avoid having throw rugs at the top or bottom of the stairs. If you do have throw rugs, attach them to the floor with carpet tape. Make sure that you have a light switch at the top of the stairs and the bottom of the stairs. If you do not have them, ask someone to add them for you. What else  can I do to help prevent falls? Wear shoes that: Do not have high heels. Have rubber bottoms. Are comfortable and fit you well. Are closed at the toe. Do not wear sandals. If you use a stepladder: Make sure that it is fully opened. Do not climb a closed stepladder. Make sure that both sides of the stepladder are locked into place. Ask someone to hold it for you, if possible. Clearly mark and make sure that you can see: Any grab bars or handrails. First and last steps. Where the edge of each step is. Use tools that help you move around (mobility aids) if they are needed. These include: Canes. Walkers. Scooters. Crutches. Turn on the lights when you go into a dark area. Replace any light bulbs as soon as they burn out. Set up your furniture so you have a clear path. Avoid moving your furniture around. If any of your floors are uneven, fix them. If there are any pets around you, be aware of where they are. Review your medicines with your doctor. Some medicines can make you feel dizzy. This can increase your chance of falling. Ask your doctor what other things that you can do to help prevent falls. This information is not intended to replace advice given to you by your health care provider. Make sure you discuss any questions you have with your health care provider. Document Released: 10/31/2008 Document Revised: 06/12/2015 Document Reviewed: 02/08/2014 Elsevier Interactive Patient Education  2017 [employer].

## 2021-03-31 ENCOUNTER — Ambulatory Visit (INDEPENDENT_AMBULATORY_CARE_PROVIDER_SITE_OTHER): Payer: Medicare HMO

## 2021-03-31 DIAGNOSIS — E538 Deficiency of other specified B group vitamins: Secondary | ICD-10-CM | POA: Diagnosis not present

## 2021-03-31 NOTE — Progress Notes (Signed)
Cyanocobalamin injection given to left arm.  Patient tolerated well. ?

## 2021-04-17 ENCOUNTER — Other Ambulatory Visit: Payer: Self-pay | Admitting: Family

## 2021-05-04 ENCOUNTER — Ambulatory Visit (INDEPENDENT_AMBULATORY_CARE_PROVIDER_SITE_OTHER): Payer: Medicare HMO | Admitting: Family Medicine

## 2021-05-04 DIAGNOSIS — E538 Deficiency of other specified B group vitamins: Secondary | ICD-10-CM

## 2021-06-03 ENCOUNTER — Ambulatory Visit (INDEPENDENT_AMBULATORY_CARE_PROVIDER_SITE_OTHER): Payer: Medicare HMO | Admitting: Emergency Medicine

## 2021-06-03 DIAGNOSIS — E538 Deficiency of other specified B group vitamins: Secondary | ICD-10-CM

## 2021-06-09 ENCOUNTER — Encounter: Payer: Self-pay | Admitting: Nurse Practitioner

## 2021-06-09 ENCOUNTER — Ambulatory Visit (INDEPENDENT_AMBULATORY_CARE_PROVIDER_SITE_OTHER): Payer: Medicare HMO | Admitting: Nurse Practitioner

## 2021-06-09 VITALS — BP 104/64 | HR 86 | Temp 97.7°F | Resp 20 | Ht 63.0 in | Wt 145.0 lb

## 2021-06-09 DIAGNOSIS — J0100 Acute maxillary sinusitis, unspecified: Secondary | ICD-10-CM | POA: Diagnosis not present

## 2021-06-09 DIAGNOSIS — Z1231 Encounter for screening mammogram for malignant neoplasm of breast: Secondary | ICD-10-CM | POA: Diagnosis not present

## 2021-06-09 MED ORDER — PREDNISONE 20 MG PO TABS: 40.0000 mg | ORAL_TABLET | Freq: Every day | ORAL | 0 refills | Status: AC

## 2021-06-09 MED ORDER — AZITHROMYCIN 250 MG PO TABS: ORAL_TABLET | ORAL | 0 refills | Status: DC

## 2021-06-09 NOTE — Patient Instructions (Signed)
1. Take meds as prescribed 2. Use a cool mist humidifier especially during the winter months and when heat has been humid. 3. Use saline nose sprays frequently 4. Saline irrigations of the nose can be very helpful if done frequently.  * 4X daily for 1 week*  * Use of a nettie pot can be helpful with this. Follow directions with this* 5. Drink plenty of fluids 6. Keep thermostat turn down low 7.For any cough or congestion- mucinex OTC 8. For fever or aces or pains- take tylenol or ibuprofen appropriate for age and weight.  * for fevers greater than 101 orally you may alternate ibuprofen and tylenol every  3 hours.    

## 2021-06-09 NOTE — Progress Notes (Signed)
Subjective:    Patient ID: Jaclyn Schmidt, female    DOB: [birth year], 71 y.o.   MRN: [id number]   Chief Complaint: Sinus Problem   Sinus Problem This is a new problem. Episode onset: 3-4 days ago. The problem has been waxing and waning since onset. There has been no fever. Her pain is at a severity of 5/10. The pain is moderate. Associated symptoms include congestion, coughing, headaches and sinus pressure. Pertinent negatives include no chills or sore throat. Past treatments include oral decongestants. The treatment provided mild relief. Is going on vacationin 2 days and doe snot want to be sick while on vacation.      Review of Systems  Constitutional:  Negative for chills and fever.  HENT:  Positive for congestion and sinus pressure. Negative for sore throat.   Respiratory:  Positive for cough.   Neurological:  Positive for headaches.      Objective:   Physical Exam Constitutional:      Appearance: Normal appearance.  HENT:     Right Ear: Tympanic membrane normal.     Left Ear: Tympanic membrane normal.     Nose: Congestion and rhinorrhea present.     Right Sinus: Maxillary sinus tenderness present. No frontal sinus tenderness.     Left Sinus: Maxillary sinus tenderness present. No frontal sinus tenderness.  Eyes:     Pupils: Pupils are equal, round, and reactive to light.  Cardiovascular:     Rate and Rhythm: Normal rate and regular rhythm.     Heart sounds: Normal heart sounds.  Pulmonary:     Breath sounds: Normal breath sounds.  Skin:    General: Skin is warm.  Neurological:     General: No focal deficit present.     Mental Status: She is alert and oriented to person, place, and time.  Psychiatric:        Mood and Affect: Mood normal.        Behavior: Behavior normal.    BP 104/64   Pulse 86   Temp 97.7 F (36.5 C) (Temporal)   Resp 20   Ht '5\' 3"'$  (1.6 m)   Wt 145 lb (65.8 kg)   SpO2 96%   BMI 25.69 kg/m        Assessment & Plan:   Jaclyn Schmidt in today with chief complaint of Sinus Problem   1. Acute non-recurrent maxillary sinusitis 1. Take meds as prescribed 2. Use a cool mist humidifier especially during the winter months and when heat has been humid. 3. Use saline nose sprays frequently 4. Saline irrigations of the nose can be very helpful if done frequently.  * 4X daily for 1 week*  * Use of a nettie pot can be helpful with this. Follow directions with this* 5. Drink plenty of fluids 6. Keep thermostat turn down low 7.For any cough or congestion- mucinex OTC 8. For fever or aces or pains- take tylenol or ibuprofen appropriate for age and weight.  * for fevers greater than 101 orally you may alternate ibuprofen and tylenol every  3 hours.    - azithromycin (ZITHROMAX Z-PAK) 250 MG tablet; As directed  Dispense: 6 tablet; Refill: 0 - predniSONE (DELTASONE) 20 MG tablet; Take 2 tablets (40 mg total) by mouth daily with breakfast for 5 days. 2 po daily for 5 days  Dispense: 10 tablet; Refill: 0    The above assessment and management plan was discussed with the patient. The patient verbalized understanding of and  has agreed to the management plan. Patient is aware to call the clinic if symptoms persist or worsen. Patient is aware when to return to the clinic for a follow-up visit. Patient educated on when it is appropriate to go to the emergency department.   Mary-Margaret Hassell Done, FNP

## 2021-06-18 ENCOUNTER — Ambulatory Visit: Payer: Medicare HMO | Admitting: Family

## 2021-06-22 DIAGNOSIS — Z0100 Encounter for examination of eyes and vision without abnormal findings: Secondary | ICD-10-CM | POA: Diagnosis not present

## 2021-06-25 ENCOUNTER — Encounter: Payer: Self-pay | Admitting: Family

## 2021-06-25 ENCOUNTER — Ambulatory Visit (INDEPENDENT_AMBULATORY_CARE_PROVIDER_SITE_OTHER): Payer: Medicare HMO | Admitting: Family

## 2021-06-25 ENCOUNTER — Other Ambulatory Visit: Payer: Self-pay | Admitting: Family

## 2021-06-25 ENCOUNTER — Ambulatory Visit (INDEPENDENT_AMBULATORY_CARE_PROVIDER_SITE_OTHER): Payer: Medicare HMO

## 2021-06-25 VITALS — BP 105/66 | HR 93 | Temp 98.7°F | Ht 63.0 in | Wt 147.0 lb

## 2021-06-25 DIAGNOSIS — J449 Chronic obstructive pulmonary disease, unspecified: Secondary | ICD-10-CM | POA: Diagnosis not present

## 2021-06-25 DIAGNOSIS — K6289 Other specified diseases of anus and rectum: Secondary | ICD-10-CM | POA: Diagnosis not present

## 2021-06-25 DIAGNOSIS — M546 Pain in thoracic spine: Secondary | ICD-10-CM | POA: Diagnosis not present

## 2021-06-25 DIAGNOSIS — J441 Chronic obstructive pulmonary disease with (acute) exacerbation: Secondary | ICD-10-CM

## 2021-06-25 DIAGNOSIS — G8929 Other chronic pain: Secondary | ICD-10-CM

## 2021-06-25 DIAGNOSIS — E039 Hypothyroidism, unspecified: Secondary | ICD-10-CM | POA: Diagnosis not present

## 2021-06-25 DIAGNOSIS — F339 Major depressive disorder, recurrent, unspecified: Secondary | ICD-10-CM

## 2021-06-25 DIAGNOSIS — F411 Generalized anxiety disorder: Secondary | ICD-10-CM

## 2021-06-25 DIAGNOSIS — R69 Illness, unspecified: Secondary | ICD-10-CM | POA: Diagnosis not present

## 2021-06-25 DIAGNOSIS — E785 Hyperlipidemia, unspecified: Secondary | ICD-10-CM | POA: Diagnosis not present

## 2021-06-25 DIAGNOSIS — Z79899 Other long term (current) drug therapy: Secondary | ICD-10-CM | POA: Diagnosis not present

## 2021-06-25 DIAGNOSIS — R059 Cough, unspecified: Secondary | ICD-10-CM | POA: Diagnosis not present

## 2021-06-25 DIAGNOSIS — M545 Low back pain, unspecified: Secondary | ICD-10-CM | POA: Diagnosis not present

## 2021-06-25 DIAGNOSIS — E538 Deficiency of other specified B group vitamins: Secondary | ICD-10-CM | POA: Diagnosis not present

## 2021-06-25 MED ORDER — ALPRAZOLAM 0.5 MG PO TABS
0.5000 mg | ORAL_TABLET | Freq: Every evening | ORAL | 1 refills | Status: DC | PRN
Start: 2021-06-25 — End: 2021-08-25

## 2021-06-25 MED ORDER — PREDNISONE 10 MG PO TABS: ORAL_TABLET | ORAL | 0 refills | Status: DC

## 2021-06-25 NOTE — Patient Instructions (Signed)

## 2021-06-25 NOTE — Progress Notes (Signed)
Subjective:    Patient ID: Jaclyn Schmidt, female    DOB: 07/28/1950, 71 y.o.   MRN: [id number]  Chief Complaint  Patient presents with   Follow-up    6 month   Nasal Congestion    In chest   Cough   PT presents to the office today for chronic follow up. She is followed by Oncologists every 6 months for Right Breast cancer. She completed radiation 2020. States she is doing well at this time. She has anal sphincter incompetence and taking Metamucil. She does pelvic floor exercises daily.    She also has Vit B 12 deficiency and gets monthly injections.    She has COPD and states her breathing is stable. She continues to smoke 1/2 pack.   She was diagnosed with a sinus infection several weeks ago. She continues to have cough that she can not get rid of it.   Cough This is a recurrent problem. The current episode started more than 1 month ago. The problem has been waxing and waning. The problem occurs every few minutes. The cough is Productive of sputum. Associated symptoms include nasal congestion, postnasal drip, shortness of breath and wheezing. Pertinent negatives include no ear congestion or ear pain. She has tried rest (antibiotics) for the symptoms. The treatment provided mild relief.  Thyroid Problem Presents for follow-up visit. Symptoms include dry skin and fatigue. Patient reports no anxiety, constipation or hoarse voice. The symptoms have been stable. Her past medical history is significant for hyperlipidemia.  Hyperlipidemia This is a chronic problem. The current episode started more than 1 year ago. The problem is controlled. Exacerbating diseases include obesity. Associated symptoms include shortness of breath. Current antihyperlipidemic treatment includes statins. The current treatment provides moderate improvement of lipids. Risk factors for coronary artery disease include dyslipidemia, diabetes mellitus, female sex, hypertension and a sedentary lifestyle.  Back Pain This is  a chronic problem. The current episode started more than 1 year ago. The problem occurs intermittently. The problem has been waxing and waning since onset. The pain is present in the lumbar spine. The quality of the pain is described as aching. The pain is at a severity of 8/10. The pain is moderate.  Depression        This is a chronic problem.  The current episode started more than 1 year ago.   The onset quality is gradual.   The problem occurs intermittently.  Associated symptoms include fatigue.  Associated symptoms include no helplessness, no hopelessness, not irritable and not sad.  Past treatments include SSRIs - Selective serotonin reuptake inhibitors.  Past medical history includes thyroid problem.       Review of Systems  Constitutional:  Positive for fatigue.  HENT:  Positive for postnasal drip. Negative for ear pain and hoarse voice.   Respiratory:  Positive for cough, shortness of breath and wheezing.   Gastrointestinal:  Negative for constipation.  Musculoskeletal:  Positive for back pain.  Psychiatric/Behavioral:  Positive for depression. The patient is not nervous/anxious.   All other systems reviewed and are negative.      Objective:   Physical Exam Vitals reviewed.  Constitutional:      General: She is not irritable.She is not in acute distress.    Appearance: She is well-developed.  HENT:     Head: Normocephalic and atraumatic.     Right Ear: External ear normal.  Eyes:     Pupils: Pupils are equal, round, and reactive to light.  Neck:  Thyroid: No thyromegaly.  Cardiovascular:     Rate and Rhythm: Normal rate and regular rhythm.     Heart sounds: Normal heart sounds. No murmur heard. Pulmonary:     Effort: Pulmonary effort is normal. No respiratory distress.     Breath sounds: Rhonchi present. No wheezing.     Comments: Coarse nonproductive cough Abdominal:     General: Bowel sounds are normal. There is no distension.     Palpations: Abdomen is soft.      Tenderness: There is no abdominal tenderness.  Musculoskeletal:        General: No tenderness. Normal range of motion.     Cervical back: Normal range of motion and neck supple.  Skin:    General: Skin is warm and dry.  Neurological:     Mental Status: She is alert and oriented to person, place, and time.     Cranial Nerves: No cranial nerve deficit.     Deep Tendon Reflexes: Reflexes are normal and symmetric.  Psychiatric:        Behavior: Behavior normal.        Thought Content: Thought content normal.        Judgment: Judgment normal.     BP 105/66   Pulse 93   Temp 98.7 F (37.1 C)   Ht _0  (1.6 m)   Wt 147 lb (66.7 kg)   SpO2 92%   BMI 26.04 kg/m      Assessment & Plan:  Jaclyn Schmidt comes in today with chief complaint of Follow-up (6 month), Nasal Congestion (In chest), and Cough   Diagnosis and orders addressed:  1. COPD exacerbation (HCC) Prednisone long dose - CBC with Differential/Platelet - CMP14+EGFR - DG Chest 2 View - predniSONE (DELTASONE) 10 MG tablet; Days 1-4 take 4 tablets (40 mg) daily  Days 5-8 take 3 tablets (30 mg) daily, Days 9-11 take 2 tablets (20 mg) daily, Days 12-14 take 1 tablet (10 mg) daily.  Dispense: 37 tablet; Refill: 0  2. Hypothyroidism, unspecified type - TSH - CBC with Differential/Platelet - CMP14+EGFR  3. Vitamin B 12 deficiency - CBC with Differential/Platelet - CMP14+EGFR  4. Chronic midline thoracic back pain - CBC with Differential/Platelet - CMP14+EGFR  5. Anal sphincter incompetence - CBC with Differential/Platelet - CMP14+EGFR  6. Depression, recurrent ([place]) - CBC with Differential/Platelet - CMP14+EGFR  7. Hyperlipidemia, unspecified hyperlipidemia type - CBC with Differential/Platelet - CMP14+EGFR  8. Lumbar pain - CBC with Differential/Platelet - CMP14+EGFR  9. GAD (generalized anxiety disorder) - CBC with Differential/Platelet - CMP14+EGFR - ALPRAZolam (XANAX) 0.5 MG tablet; Take 1  tablet (0.5 mg total) by mouth at bedtime as needed for anxiety.  Dispense: 20 tablet; Refill: 1  10. Controlled substance agreement signed - CBC with Differential/Platelet - CMP14+EGFR - ALPRAZolam (XANAX) 0.5 MG tablet; Take 1 tablet (0.5 mg total) by mouth at bedtime as needed for anxiety.  Dispense: 20 tablet; Refill: 1   Labs pending Patient reviewed in [place] controlled database, no flags noted. Contract and drug screen are up to date.  Health Maintenance reviewed Diet and exercise encouraged  Follow up plan: 3 months    Evelina Dun, FNP

## 2021-06-26 ENCOUNTER — Other Ambulatory Visit: Payer: Self-pay | Admitting: Family

## 2021-06-26 LAB — CBC WITH DIFFERENTIAL/PLATELET
Basophils Absolute: 0 10*3/uL (ref 0.0–0.2)
Basos: 1 %
EOS (ABSOLUTE): 0.2 10*3/uL (ref 0.0–0.4)
Eos: 3 %
Hematocrit: 42 % (ref 34.0–46.6)
Hemoglobin: 13.8 g/dL (ref 11.1–15.9)
Immature Grans (Abs): 0.1 10*3/uL (ref 0.0–0.1)
Immature Granulocytes: 1 %
Lymphocytes Absolute: 1.4 10*3/uL (ref 0.7–3.1)
Lymphs: 28 %
MCH: 29.7 pg (ref 26.6–33.0)
MCHC: 32.9 g/dL (ref 31.5–35.7)
MCV: 90 fL (ref 79–97)
Monocytes Absolute: 0.5 10*3/uL (ref 0.1–0.9)
Monocytes: 10 %
Neutrophils Absolute: 2.9 10*3/uL (ref 1.4–7.0)
Neutrophils: 57 %
Platelets: 296 10*3/uL (ref 150–450)
RBC: 4.65 x10E6/uL (ref 3.77–5.28)
RDW: 12.8 % (ref 11.7–15.4)
WBC: 5.1 10*3/uL (ref 3.4–10.8)

## 2021-06-26 LAB — CMP14+EGFR
ALT: 17 IU/L (ref 0–32)
AST: 20 IU/L (ref 0–40)
Albumin/Globulin Ratio: 1.9 (ref 1.2–2.2)
Albumin: 4.5 g/dL (ref 3.8–4.8)
Alkaline Phosphatase: 105 IU/L (ref 44–121)
BUN/Creatinine Ratio: 18 (ref 12–28)
BUN: 15 mg/dL (ref 8–27)
Bilirubin Total: 0.4 mg/dL (ref 0.0–1.2)
CO2: 19 mmol/L — ABNORMAL LOW (ref 20–29)
Calcium: 9.5 mg/dL (ref 8.7–10.3)
Chloride: 106 mmol/L (ref 96–106)
Creatinine, Ser: 0.83 mg/dL (ref 0.57–1.00)
Globulin, Total: 2.4 g/dL (ref 1.5–4.5)
Glucose: 92 mg/dL (ref 70–99)
Potassium: 4.9 mmol/L (ref 3.5–5.2)
Sodium: 140 mmol/L (ref 134–144)
Total Protein: 6.9 g/dL (ref 6.0–8.5)
eGFR: 76 mL/min/{1.73_m2} (ref >59)

## 2021-06-26 LAB — TSH: TSH: 5.63 u[IU]/mL — ABNORMAL HIGH (ref 0.450–4.500)

## 2021-06-26 MED ORDER — LEVOTHYROXINE SODIUM 88 MCG PO TABS: 88.0000 ug | ORAL_TABLET | Freq: Every day | ORAL | 1 refills | Status: DC

## 2021-06-26 NOTE — Progress Notes (Signed)
Patient returning call. Please call back

## 2021-06-26 NOTE — Progress Notes (Signed)
Patient returning call. Please call back. She said it is okay to leave a detailed message.

## 2021-06-29 ENCOUNTER — Telehealth: Payer: Self-pay | Admitting: Family

## 2021-06-29 MED ORDER — LEVOTHYROXINE SODIUM 88 MCG PO TABS: 88.0000 ug | ORAL_TABLET | Freq: Every day | ORAL | 1 refills | Status: DC

## 2021-06-29 NOTE — Telephone Encounter (Signed)
[facility], [facility] [place] - she uses what is cheapest.

## 2021-06-29 NOTE — Telephone Encounter (Signed)
Pt called stating that she was told by PCP to call her insurance to see if they would cover insulin shots for weight loss.  Pt says she talked with someone from her insurance and was told that it would require a PA.  Pt wants PCP to send PA for insulin for weight loss.

## 2021-06-29 NOTE — Telephone Encounter (Signed)
Pt says her Levothyroxine Rx was sent to wrong pharmacy. [facility] [place].

## 2021-06-30 NOTE — Telephone Encounter (Signed)
Her BMI is not high enough to get medications.   Evelina Dun, FNP

## 2021-06-30 NOTE — Telephone Encounter (Signed)
Patient aware.

## 2021-07-06 ENCOUNTER — Ambulatory Visit (INDEPENDENT_AMBULATORY_CARE_PROVIDER_SITE_OTHER): Payer: Medicare HMO | Admitting: Emergency Medicine

## 2021-07-06 DIAGNOSIS — E538 Deficiency of other specified B group vitamins: Secondary | ICD-10-CM

## 2021-07-06 NOTE — Progress Notes (Signed)
Patient presents for B12 Injection. Injection given in Left Deltoid. Patient tolerated well.

## 2021-07-07 ENCOUNTER — Other Ambulatory Visit: Payer: Self-pay | Admitting: Family

## 2021-07-09 DIAGNOSIS — Z0100 Encounter for examination of eyes and vision without abnormal findings: Secondary | ICD-10-CM | POA: Diagnosis not present

## 2021-07-28 DIAGNOSIS — E538 Deficiency of other specified B group vitamins: Secondary | ICD-10-CM | POA: Diagnosis not present

## 2021-07-28 DIAGNOSIS — D709 Neutropenia, unspecified: Secondary | ICD-10-CM | POA: Diagnosis not present

## 2021-07-28 DIAGNOSIS — Z5181 Encounter for therapeutic drug level monitoring: Secondary | ICD-10-CM | POA: Diagnosis not present

## 2021-07-28 DIAGNOSIS — Z79811 Long term (current) use of aromatase inhibitors: Secondary | ICD-10-CM | POA: Diagnosis not present

## 2021-07-28 DIAGNOSIS — C50511 Malignant neoplasm of lower-outer quadrant of right female breast: Secondary | ICD-10-CM | POA: Diagnosis not present

## 2021-07-28 DIAGNOSIS — T451X5A Adverse effect of antineoplastic and immunosuppressive drugs, initial encounter: Secondary | ICD-10-CM | POA: Diagnosis not present

## 2021-07-28 DIAGNOSIS — R922 Inconclusive mammogram: Secondary | ICD-10-CM | POA: Diagnosis not present

## 2021-07-28 DIAGNOSIS — Z170 Estrogen receptor positive status [ER+]: Secondary | ICD-10-CM | POA: Diagnosis not present

## 2021-07-28 DIAGNOSIS — R232 Flushing: Secondary | ICD-10-CM | POA: Diagnosis not present

## 2021-07-28 DIAGNOSIS — E663 Overweight: Secondary | ICD-10-CM | POA: Diagnosis not present

## 2021-07-28 DIAGNOSIS — M2550 Pain in unspecified joint: Secondary | ICD-10-CM | POA: Diagnosis not present

## 2021-07-28 DIAGNOSIS — Z9189 Other specified personal risk factors, not elsewhere classified: Secondary | ICD-10-CM | POA: Diagnosis not present

## 2021-07-28 DIAGNOSIS — M8588 Other specified disorders of bone density and structure, other site: Secondary | ICD-10-CM | POA: Diagnosis not present

## 2021-08-05 ENCOUNTER — Ambulatory Visit (INDEPENDENT_AMBULATORY_CARE_PROVIDER_SITE_OTHER): Payer: Medicare HMO | Admitting: *Deleted

## 2021-08-05 DIAGNOSIS — E538 Deficiency of other specified B group vitamins: Secondary | ICD-10-CM

## 2021-08-05 NOTE — Progress Notes (Signed)
Patient in today for weekly B 12 injection. 1000 mcg given IM in left deltoid. Patient tolerated well.

## 2021-08-19 ENCOUNTER — Other Ambulatory Visit: Payer: Self-pay | Admitting: Family

## 2021-08-19 DIAGNOSIS — E785 Hyperlipidemia, unspecified: Secondary | ICD-10-CM

## 2021-08-22 ENCOUNTER — Other Ambulatory Visit: Payer: Self-pay | Admitting: Family

## 2021-08-25 ENCOUNTER — Ambulatory Visit (INDEPENDENT_AMBULATORY_CARE_PROVIDER_SITE_OTHER): Payer: Medicare HMO | Admitting: Family

## 2021-08-25 ENCOUNTER — Other Ambulatory Visit: Payer: Self-pay

## 2021-08-25 ENCOUNTER — Ambulatory Visit (INDEPENDENT_AMBULATORY_CARE_PROVIDER_SITE_OTHER): Payer: Medicare HMO

## 2021-08-25 ENCOUNTER — Encounter: Payer: Self-pay | Admitting: Family

## 2021-08-25 VITALS — BP 141/70 | HR 77 | Temp 97.5°F | Ht 63.0 in | Wt 149.4 lb

## 2021-08-25 DIAGNOSIS — Z79899 Other long term (current) drug therapy: Secondary | ICD-10-CM | POA: Diagnosis not present

## 2021-08-25 DIAGNOSIS — F339 Major depressive disorder, recurrent, unspecified: Secondary | ICD-10-CM

## 2021-08-25 DIAGNOSIS — R69 Illness, unspecified: Secondary | ICD-10-CM | POA: Diagnosis not present

## 2021-08-25 DIAGNOSIS — F411 Generalized anxiety disorder: Secondary | ICD-10-CM

## 2021-08-25 DIAGNOSIS — J410 Simple chronic bronchitis: Secondary | ICD-10-CM | POA: Diagnosis not present

## 2021-08-25 DIAGNOSIS — E538 Deficiency of other specified B group vitamins: Secondary | ICD-10-CM

## 2021-08-25 DIAGNOSIS — Z780 Asymptomatic menopausal state: Secondary | ICD-10-CM | POA: Diagnosis not present

## 2021-08-25 DIAGNOSIS — E785 Hyperlipidemia, unspecified: Secondary | ICD-10-CM | POA: Diagnosis not present

## 2021-08-25 DIAGNOSIS — G8929 Other chronic pain: Secondary | ICD-10-CM | POA: Diagnosis not present

## 2021-08-25 DIAGNOSIS — E039 Hypothyroidism, unspecified: Secondary | ICD-10-CM

## 2021-08-25 DIAGNOSIS — R42 Dizziness and giddiness: Secondary | ICD-10-CM

## 2021-08-25 DIAGNOSIS — F17200 Nicotine dependence, unspecified, uncomplicated: Secondary | ICD-10-CM

## 2021-08-25 DIAGNOSIS — M8589 Other specified disorders of bone density and structure, multiple sites: Secondary | ICD-10-CM | POA: Diagnosis not present

## 2021-08-25 DIAGNOSIS — K6289 Other specified diseases of anus and rectum: Secondary | ICD-10-CM

## 2021-08-25 DIAGNOSIS — M546 Pain in thoracic spine: Secondary | ICD-10-CM

## 2021-08-25 MED ORDER — ALPRAZOLAM 0.5 MG PO TABS: 0.5000 mg | ORAL_TABLET | Freq: Every evening | ORAL | 1 refills | Status: DC | PRN

## 2021-08-25 NOTE — Progress Notes (Signed)
Subjective:    Patient ID: Jaclyn Schmidt, female    DOB: 11/30/1950, 71 y.o.   MRN: [id number]  Chief Complaint  Patient presents with   Hypothyroidism   PT presents to the office today for chronic follow up. She is followed by Oncologists every 6 months for Right Breast cancer. She completed radiation 2020. States she is doing well at this time. She has anal sphincter incompetence and taking Metamucil. She does pelvic floor exercises daily.    She also has Vit B 12 deficiency and gets monthly injections.    She has COPD and states her breathing is stable. She continues to smoke 1/2 pack.   She has osteopenia and due for her dexa scan today. Takes Vit D daily, but not calcium.    Hyperlipidemia This is a chronic problem. The current episode started more than 1 year ago. The problem is controlled. Recent lipid tests were reviewed and are normal. Current antihyperlipidemic treatment includes statins. The current treatment provides moderate improvement of lipids.  Anemia Presents for follow-up visit. There has been no bruising/bleeding easily or malaise/fatigue.  Depression        This is a chronic problem.  The current episode started more than 1 year ago.   The onset quality is gradual.   The problem occurs intermittently.  Associated symptoms include no helplessness, no hopelessness and not sad.  Past treatments include SSRIs - Selective serotonin reuptake inhibitors.  Past medical history includes anxiety.   Back Pain This is a chronic problem. The current episode started more than 1 year ago. The problem occurs intermittently. The problem has been waxing and waning since onset. The pain is present in the thoracic spine. The quality of the pain is described as aching. The pain is at a severity of 8/10. The pain is moderate. She has tried NSAIDs for the symptoms. The treatment provided mild relief.  Anxiety Presents for follow-up visit. Symptoms include excessive worry, irritability and  nervous/anxious behavior. Symptoms occur occasionally. The severity of symptoms is moderate. The quality of sleep is good.   Her past medical history is significant for anemia.      Review of Systems  Constitutional:  Positive for irritability. Negative for malaise/fatigue.  Musculoskeletal:  Positive for back pain.  Hematological:  Does not bruise/bleed easily.  Psychiatric/Behavioral:  Positive for depression. The patient is nervous/anxious.   All other systems reviewed and are negative.      Objective:   Physical Exam Vitals reviewed.  Constitutional:      General: She is not in acute distress.    Appearance: She is well-developed.  HENT:     Head: Normocephalic and atraumatic.     Right Ear: Tympanic membrane normal.     Left Ear: Tympanic membrane normal.  Eyes:     Pupils: Pupils are equal, round, and reactive to light.  Neck:     Thyroid: No thyromegaly.  Cardiovascular:     Rate and Rhythm: Normal rate and regular rhythm.     Heart sounds: Normal heart sounds. No murmur heard. Pulmonary:     Effort: Pulmonary effort is normal. No respiratory distress.     Breath sounds: Normal breath sounds. No wheezing.  Abdominal:     General: Bowel sounds are normal. There is no distension.     Palpations: Abdomen is soft.     Tenderness: There is no abdominal tenderness.  Musculoskeletal:        General: No tenderness. Normal range of motion.  Cervical back: Normal range of motion and neck supple.  Skin:    General: Skin is warm and dry.  Neurological:     Mental Status: She is alert and oriented to person, place, and time.     Cranial Nerves: No cranial nerve deficit.     Deep Tendon Reflexes: Reflexes are normal and symmetric.  Psychiatric:        Behavior: Behavior normal.        Thought Content: Thought content normal.        Judgment: Judgment normal.      BP (!) 141/70   Pulse 77   Temp (!) 97.5 F (36.4 C) (Temporal)   Ht 5' 3" (1.6 m)   Wt 149 lb  6.4 oz (67.8 kg)   SpO2 99%   BMI 26.47 kg/m       Assessment & Plan:  Jaclyn Schmidt comes in today with chief complaint of Hypothyroidism   Diagnosis and orders addressed:  1. Hypothyroidism, unspecified type - CMP14+EGFR - TSH  2. Hyperlipidemia, unspecified hyperlipidemia type - CMP14+EGFR  3. Depression, recurrent ([place]) - CMP14+EGFR  4. Anal sphincter incompetence - CMP14+EGFR  5. Vitamin B 12 deficiency - CMP14+EGFR  6. Vertigo - CMP14+EGFR  7. Chronic midline thoracic back pain - CMP14+EGFR  8. Simple chronic bronchitis ([place]) - CMP14+EGFR   Labs pending Health Maintenance reviewed Diet and exercise encouraged  Follow up plan: 6 months    Evelina Dun, FNP

## 2021-08-25 NOTE — Patient Instructions (Signed)

## 2021-08-26 LAB — CMP14+EGFR
ALT: 9 IU/L (ref 0–32)
AST: 16 IU/L (ref 0–40)
Albumin/Globulin Ratio: 2.1 (ref 1.2–2.2)
Albumin: 4.4 g/dL (ref 3.9–4.9)
Alkaline Phosphatase: 85 IU/L (ref 44–121)
BUN/Creatinine Ratio: 18 (ref 12–28)
BUN: 12 mg/dL (ref 8–27)
Bilirubin Total: 0.4 mg/dL (ref 0.0–1.2)
CO2: 22 mmol/L (ref 20–29)
Calcium: 9.4 mg/dL (ref 8.7–10.3)
Chloride: 103 mmol/L (ref 96–106)
Creatinine, Ser: 0.66 mg/dL (ref 0.57–1.00)
Globulin, Total: 2.1 g/dL (ref 1.5–4.5)
Glucose: 87 mg/dL (ref 70–99)
Potassium: 4.5 mmol/L (ref 3.5–5.2)
Sodium: 140 mmol/L (ref 134–144)
Total Protein: 6.5 g/dL (ref 6.0–8.5)
eGFR: 94 mL/min/{1.73_m2} (ref >59)

## 2021-08-26 LAB — TSH: TSH: 0.1 u[IU]/mL — ABNORMAL LOW (ref 0.450–4.500)

## 2021-08-27 ENCOUNTER — Other Ambulatory Visit: Payer: Self-pay | Admitting: Family

## 2021-08-27 DIAGNOSIS — E039 Hypothyroidism, unspecified: Secondary | ICD-10-CM

## 2021-08-27 MED ORDER — LEVOTHYROXINE SODIUM 75 MCG PO TABS: 75.0000 ug | ORAL_TABLET | Freq: Every day | ORAL | 11 refills | Status: DC

## 2021-08-28 LAB — TOXASSURE SELECT 13 (MW), URINE

## 2021-08-31 ENCOUNTER — Other Ambulatory Visit: Payer: Self-pay | Admitting: Family

## 2021-09-01 ENCOUNTER — Telehealth: Payer: Self-pay | Admitting: Family

## 2021-09-01 ENCOUNTER — Ambulatory Visit (INDEPENDENT_AMBULATORY_CARE_PROVIDER_SITE_OTHER): Payer: Medicare HMO

## 2021-09-01 DIAGNOSIS — E538 Deficiency of other specified B group vitamins: Secondary | ICD-10-CM

## 2021-09-01 DIAGNOSIS — Z5181 Encounter for therapeutic drug level monitoring: Secondary | ICD-10-CM | POA: Diagnosis not present

## 2021-09-01 DIAGNOSIS — E039 Hypothyroidism, unspecified: Secondary | ICD-10-CM

## 2021-09-01 DIAGNOSIS — Z79811 Long term (current) use of aromatase inhibitors: Secondary | ICD-10-CM | POA: Diagnosis not present

## 2021-09-01 MED ORDER — LEVOTHYROXINE SODIUM 75 MCG PO TABS: 75.0000 ug | ORAL_TABLET | Freq: Every day | ORAL | 11 refills | Status: DC

## 2021-09-01 NOTE — Progress Notes (Signed)
Cyanocobalamin injection given to left deltoid.  Patient tolerated well. 

## 2021-09-01 NOTE — Telephone Encounter (Signed)
Levothyroxine sent to [facility].

## 2021-10-01 ENCOUNTER — Other Ambulatory Visit: Payer: Self-pay | Admitting: Family

## 2021-10-01 DIAGNOSIS — F339 Major depressive disorder, recurrent, unspecified: Secondary | ICD-10-CM

## 2021-10-01 DIAGNOSIS — F411 Generalized anxiety disorder: Secondary | ICD-10-CM

## 2021-10-02 ENCOUNTER — Ambulatory Visit: Payer: Medicare HMO

## 2021-10-08 ENCOUNTER — Ambulatory Visit: Payer: Medicare HMO

## 2021-10-13 IMAGING — DX DG CHEST 2V
2 series · 2 of 2 positions shown · non-contrast
Comparison: None.

CLINICAL DATA: 69-year-old female

EXAM:
CHEST - 2 VIEW

[chest pa]
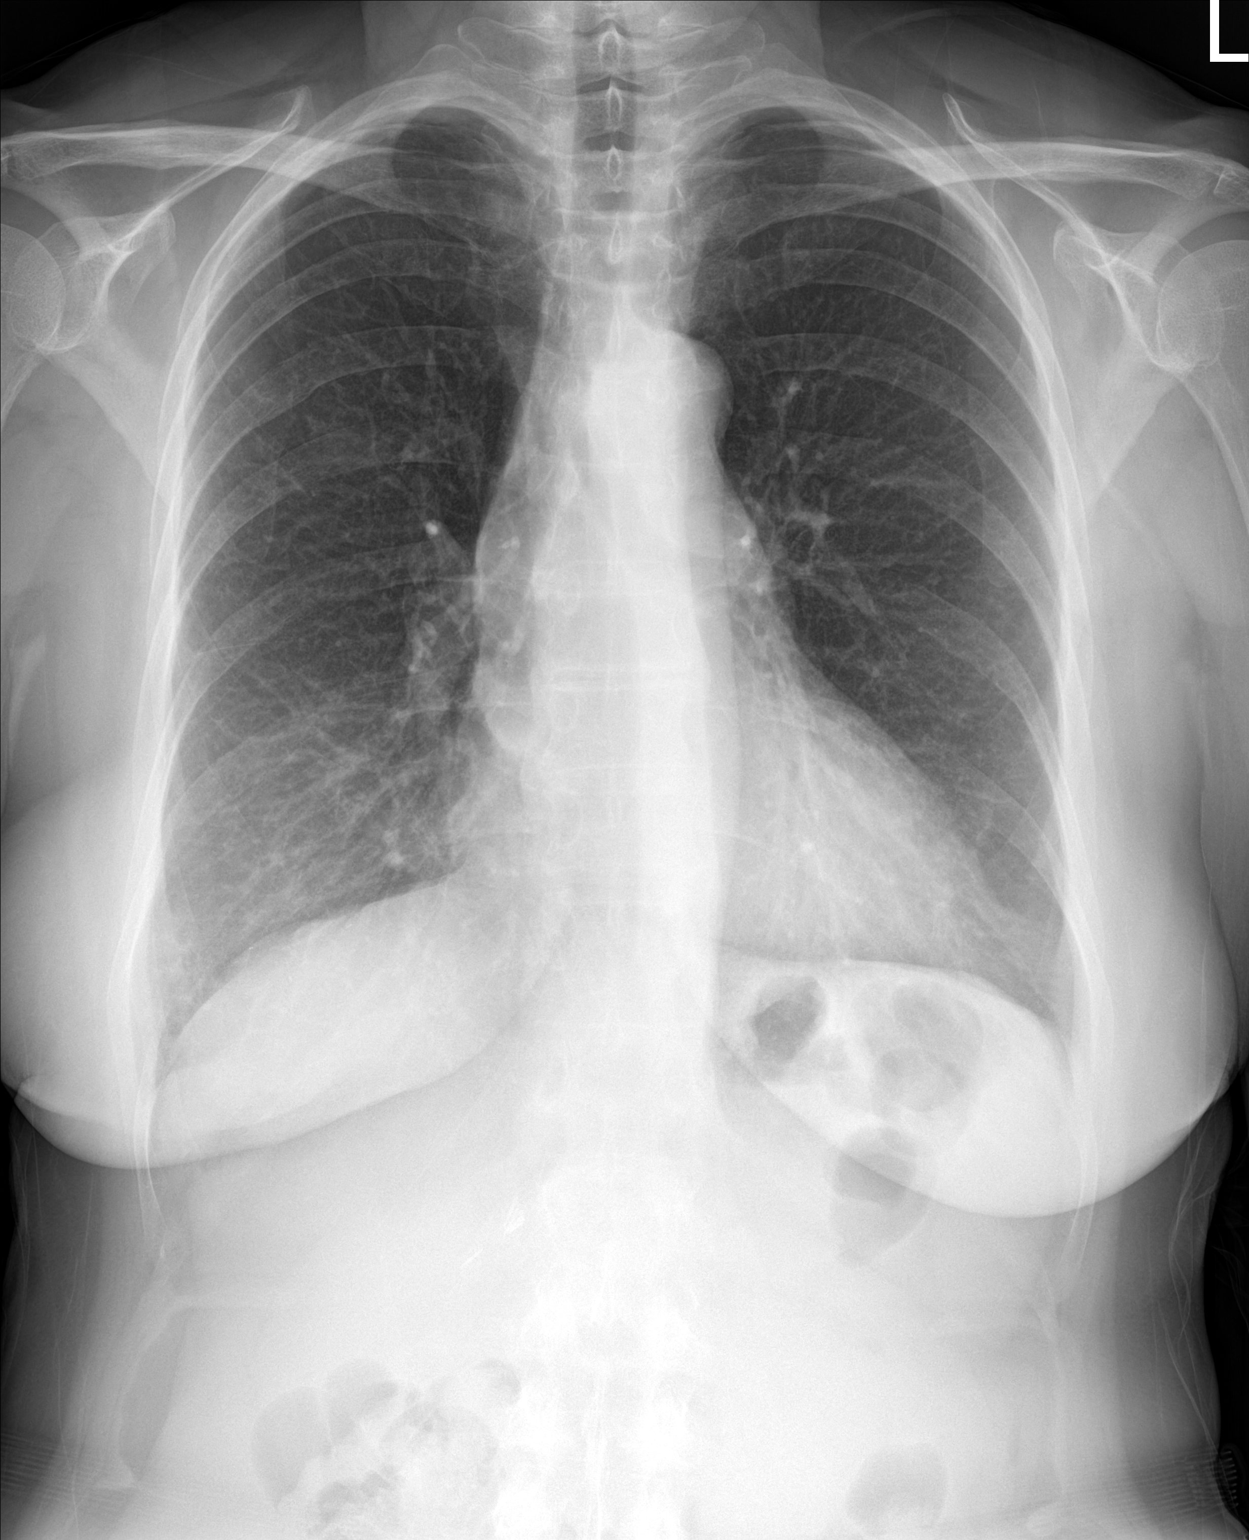

[chest lat]
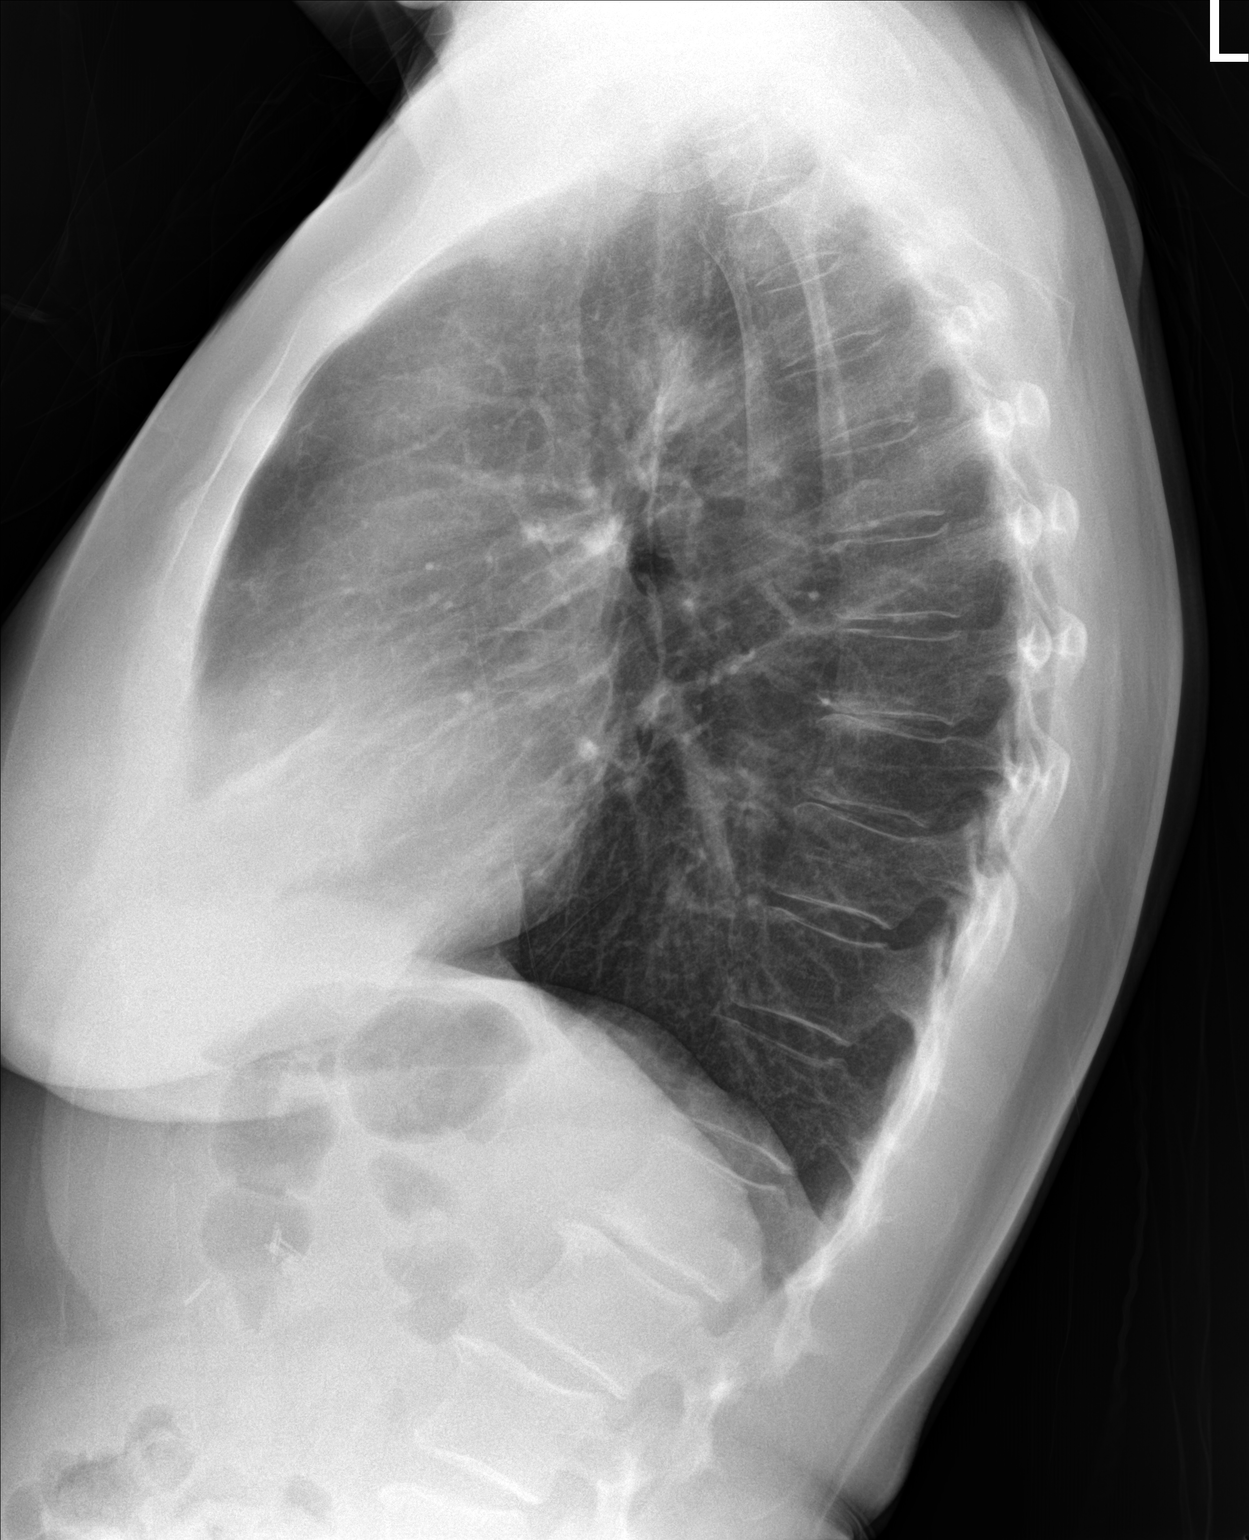

[2 of 2 positions shown; findings below may reference images not displayed]

FINDINGS: The heart size and mediastinal contours are within normal limits.
Both lungs are clear. The visualized skeletal structures are
unremarkable.
IMPRESSION: No active cardiopulmonary disease.

## 2021-10-23 ENCOUNTER — Ambulatory Visit (INDEPENDENT_AMBULATORY_CARE_PROVIDER_SITE_OTHER): Payer: Medicare HMO

## 2021-10-23 DIAGNOSIS — E538 Deficiency of other specified B group vitamins: Secondary | ICD-10-CM | POA: Diagnosis not present

## 2021-10-27 ENCOUNTER — Other Ambulatory Visit: Payer: Medicare HMO

## 2021-10-27 DIAGNOSIS — E039 Hypothyroidism, unspecified: Secondary | ICD-10-CM

## 2021-10-28 LAB — TSH: TSH: 3.38 u[IU]/mL (ref 0.450–4.500)

## 2021-11-14 ENCOUNTER — Other Ambulatory Visit: Payer: Self-pay | Admitting: Family

## 2021-11-14 DIAGNOSIS — E785 Hyperlipidemia, unspecified: Secondary | ICD-10-CM

## 2021-11-20 ENCOUNTER — Other Ambulatory Visit: Payer: Self-pay | Admitting: Family

## 2021-11-20 NOTE — Telephone Encounter (Signed)
Last office visit 08/25/21 Last refill 08/22/21, #90, no refills

## 2021-11-27 ENCOUNTER — Ambulatory Visit: Payer: Medicare HMO | Admitting: Family

## 2021-11-27 ENCOUNTER — Ambulatory Visit (INDEPENDENT_AMBULATORY_CARE_PROVIDER_SITE_OTHER): Payer: Medicare HMO

## 2021-11-27 ENCOUNTER — Telehealth: Payer: Self-pay | Admitting: Family

## 2021-11-27 ENCOUNTER — Encounter: Payer: Self-pay | Admitting: Family

## 2021-11-27 DIAGNOSIS — E538 Deficiency of other specified B group vitamins: Secondary | ICD-10-CM | POA: Diagnosis not present

## 2021-11-27 NOTE — Progress Notes (Signed)
Cyanocobalamin injection given to left deltoid.  Patient tolerated well. 

## 2021-11-27 NOTE — Telephone Encounter (Signed)
Coming to see triage

## 2021-12-17 DIAGNOSIS — M76811 Anterior tibial syndrome, right leg: Secondary | ICD-10-CM | POA: Diagnosis not present

## 2021-12-17 DIAGNOSIS — M79671 Pain in right foot: Secondary | ICD-10-CM | POA: Diagnosis not present

## 2021-12-18 ENCOUNTER — Ambulatory Visit (INDEPENDENT_AMBULATORY_CARE_PROVIDER_SITE_OTHER): Payer: Medicare HMO

## 2021-12-18 DIAGNOSIS — Z23 Encounter for immunization: Secondary | ICD-10-CM

## 2021-12-23 ENCOUNTER — Ambulatory Visit: Payer: Medicare HMO | Admitting: Dermatology

## 2021-12-31 ENCOUNTER — Other Ambulatory Visit: Payer: Self-pay | Admitting: Family

## 2021-12-31 DIAGNOSIS — F411 Generalized anxiety disorder: Secondary | ICD-10-CM

## 2021-12-31 DIAGNOSIS — F339 Major depressive disorder, recurrent, unspecified: Secondary | ICD-10-CM

## 2022-01-01 ENCOUNTER — Ambulatory Visit: Payer: Medicare HMO

## 2022-01-04 ENCOUNTER — Ambulatory Visit: Payer: Medicare HMO

## 2022-01-07 ENCOUNTER — Ambulatory Visit (INDEPENDENT_AMBULATORY_CARE_PROVIDER_SITE_OTHER): Payer: Medicare HMO

## 2022-01-07 DIAGNOSIS — E538 Deficiency of other specified B group vitamins: Secondary | ICD-10-CM

## 2022-01-07 DIAGNOSIS — M79671 Pain in right foot: Secondary | ICD-10-CM | POA: Diagnosis not present

## 2022-01-07 DIAGNOSIS — M76821 Posterior tibial tendinitis, right leg: Secondary | ICD-10-CM | POA: Diagnosis not present

## 2022-01-07 NOTE — Progress Notes (Signed)
Cyanocobalamin injection given to left deltoid.  Patient tolerated well. 

## 2022-01-29 DIAGNOSIS — D709 Neutropenia, unspecified: Secondary | ICD-10-CM | POA: Diagnosis not present

## 2022-01-29 DIAGNOSIS — Z170 Estrogen receptor positive status [ER+]: Secondary | ICD-10-CM | POA: Diagnosis not present

## 2022-01-29 DIAGNOSIS — Z5181 Encounter for therapeutic drug level monitoring: Secondary | ICD-10-CM | POA: Diagnosis not present

## 2022-01-29 DIAGNOSIS — E538 Deficiency of other specified B group vitamins: Secondary | ICD-10-CM | POA: Diagnosis not present

## 2022-01-29 DIAGNOSIS — C50511 Malignant neoplasm of lower-outer quadrant of right female breast: Secondary | ICD-10-CM | POA: Diagnosis not present

## 2022-01-29 DIAGNOSIS — M8588 Other specified disorders of bone density and structure, other site: Secondary | ICD-10-CM | POA: Diagnosis not present

## 2022-01-29 DIAGNOSIS — Z79811 Long term (current) use of aromatase inhibitors: Secondary | ICD-10-CM | POA: Diagnosis not present

## 2022-02-01 ENCOUNTER — Other Ambulatory Visit: Payer: Self-pay | Admitting: Family

## 2022-02-02 NOTE — Telephone Encounter (Signed)
Last office visit 08/25/21 Last refill 02/16/21, #60, 3 refills

## 2022-02-05 ENCOUNTER — Ambulatory Visit (INDEPENDENT_AMBULATORY_CARE_PROVIDER_SITE_OTHER): Payer: Medicare HMO

## 2022-02-05 DIAGNOSIS — E538 Deficiency of other specified B group vitamins: Secondary | ICD-10-CM | POA: Diagnosis not present

## 2022-02-05 NOTE — Progress Notes (Signed)
Cyanocobalamin injection given to left deltoid.  Patient tolerated well. 

## 2022-02-11 ENCOUNTER — Other Ambulatory Visit: Payer: Self-pay | Admitting: Family

## 2022-02-11 DIAGNOSIS — E785 Hyperlipidemia, unspecified: Secondary | ICD-10-CM

## 2022-02-11 DIAGNOSIS — H52229 Regular astigmatism, unspecified eye: Secondary | ICD-10-CM | POA: Diagnosis not present

## 2022-02-11 DIAGNOSIS — H524 Presbyopia: Secondary | ICD-10-CM | POA: Diagnosis not present

## 2022-02-11 DIAGNOSIS — H2510 Age-related nuclear cataract, unspecified eye: Secondary | ICD-10-CM | POA: Diagnosis not present

## 2022-02-19 ENCOUNTER — Other Ambulatory Visit: Payer: Self-pay | Admitting: Family

## 2022-02-25 ENCOUNTER — Encounter: Payer: Self-pay | Admitting: Orthopaedic Surgery

## 2022-02-25 ENCOUNTER — Ambulatory Visit (INDEPENDENT_AMBULATORY_CARE_PROVIDER_SITE_OTHER): Payer: Medicare HMO

## 2022-02-25 ENCOUNTER — Ambulatory Visit (INDEPENDENT_AMBULATORY_CARE_PROVIDER_SITE_OTHER): Payer: Medicare HMO | Admitting: Orthopaedic Surgery

## 2022-02-25 ENCOUNTER — Encounter: Payer: Self-pay | Admitting: Family

## 2022-02-25 ENCOUNTER — Ambulatory Visit (INDEPENDENT_AMBULATORY_CARE_PROVIDER_SITE_OTHER): Payer: Medicare HMO | Admitting: Family

## 2022-02-25 VITALS — BP 110/75 | HR 69 | Temp 97.0°F | Ht 62.0 in | Wt 144.6 lb

## 2022-02-25 VITALS — Ht 63.0 in | Wt 150.0 lb

## 2022-02-25 DIAGNOSIS — E785 Hyperlipidemia, unspecified: Secondary | ICD-10-CM

## 2022-02-25 DIAGNOSIS — Z79899 Other long term (current) drug therapy: Secondary | ICD-10-CM

## 2022-02-25 DIAGNOSIS — Z0001 Encounter for general adult medical examination with abnormal findings: Secondary | ICD-10-CM

## 2022-02-25 DIAGNOSIS — J410 Simple chronic bronchitis: Secondary | ICD-10-CM | POA: Diagnosis not present

## 2022-02-25 DIAGNOSIS — M79604 Pain in right leg: Secondary | ICD-10-CM

## 2022-02-25 DIAGNOSIS — K6289 Other specified diseases of anus and rectum: Secondary | ICD-10-CM | POA: Diagnosis not present

## 2022-02-25 DIAGNOSIS — J449 Chronic obstructive pulmonary disease, unspecified: Secondary | ICD-10-CM | POA: Diagnosis not present

## 2022-02-25 DIAGNOSIS — M7061 Trochanteric bursitis, right hip: Secondary | ICD-10-CM | POA: Diagnosis not present

## 2022-02-25 DIAGNOSIS — E039 Hypothyroidism, unspecified: Secondary | ICD-10-CM | POA: Diagnosis not present

## 2022-02-25 DIAGNOSIS — M545 Low back pain, unspecified: Secondary | ICD-10-CM | POA: Diagnosis not present

## 2022-02-25 DIAGNOSIS — F339 Major depressive disorder, recurrent, unspecified: Secondary | ICD-10-CM

## 2022-02-25 DIAGNOSIS — Z170 Estrogen receptor positive status [ER+]: Secondary | ICD-10-CM

## 2022-02-25 DIAGNOSIS — M8580 Other specified disorders of bone density and structure, unspecified site: Secondary | ICD-10-CM | POA: Diagnosis not present

## 2022-02-25 DIAGNOSIS — Z0000 Encounter for general adult medical examination without abnormal findings: Secondary | ICD-10-CM | POA: Diagnosis not present

## 2022-02-25 DIAGNOSIS — C50311 Malignant neoplasm of lower-inner quadrant of right female breast: Secondary | ICD-10-CM

## 2022-02-25 DIAGNOSIS — F411 Generalized anxiety disorder: Secondary | ICD-10-CM | POA: Diagnosis not present

## 2022-02-25 DIAGNOSIS — R69 Illness, unspecified: Secondary | ICD-10-CM | POA: Diagnosis not present

## 2022-02-25 MED ORDER — LEVOTHYROXINE SODIUM 75 MCG PO TABS: 75.0000 ug | ORAL_TABLET | Freq: Every day | ORAL | 11 refills | Status: DC

## 2022-02-25 MED ORDER — GABAPENTIN 300 MG PO CAPS: ORAL_CAPSULE | ORAL | 1 refills | Status: DC

## 2022-02-25 MED ORDER — ALPRAZOLAM 0.5 MG PO TABS: 0.5000 mg | ORAL_TABLET | Freq: Every evening | ORAL | 1 refills | Status: DC | PRN

## 2022-02-25 MED ORDER — MELOXICAM 15 MG PO TABS: 15.0000 mg | ORAL_TABLET | Freq: Every day | ORAL | 0 refills | Status: DC

## 2022-02-25 MED ORDER — MECLIZINE HCL 25 MG PO TABS: ORAL_TABLET | ORAL | 3 refills | Status: DC

## 2022-02-25 MED ORDER — LIDOCAINE HCL 1 % IJ SOLN
1.0000 mL | INTRAMUSCULAR | Status: AC | PRN
  Administered 2022-02-25: 1 mL

## 2022-02-25 MED ORDER — METHYLPREDNISOLONE ACETATE 40 MG/ML IJ SUSP
40.0000 mg | INTRAMUSCULAR | Status: AC | PRN
  Administered 2022-02-25: 40 mg via INTRA_ARTICULAR

## 2022-02-25 MED ORDER — SERTRALINE HCL 100 MG PO TABS: 100.0000 mg | ORAL_TABLET | Freq: Every day | ORAL | 0 refills | Status: DC

## 2022-02-25 MED ORDER — BUPIVACAINE HCL 0.25 % IJ SOLN
2.0000 mL | INTRAMUSCULAR | Status: AC | PRN
  Administered 2022-02-25: 2 mL via INTRA_ARTICULAR

## 2022-02-25 MED ORDER — PRAVASTATIN SODIUM 40 MG PO TABS: 40.0000 mg | ORAL_TABLET | Freq: Every day | ORAL | 1 refills | Status: DC

## 2022-02-25 NOTE — Progress Notes (Signed)
Subjective:    Patient ID: Jaclyn Schmidt, female    DOB: [birth year], 72 y.o.   MRN: [id number]  Chief Complaint  Patient presents with   Medical Management of Chronic Issues     PT presents to the office today for CPE and chronic follow up. She is followed by Oncologists every 6 months for Right Breast cancer. She completed radiation 2020. States she is doing well at this time. She has anal sphincter incompetence and taking Metamucil. She does pelvic floor exercises daily.    She also has Vit B 12 deficiency and gets monthly injections.    She has COPD and states her breathing is stable. She continues to smoke 1/2 pack.    She has osteopenia and had dexa scan 08/25/21. Takes Vit D and calcium daily.  Thyroid Problem Presents for follow-up visit. Symptoms include dry skin and fatigue. Patient reports no anxiety or depressed mood. The symptoms have been stable. Her past medical history is significant for hyperlipidemia.  Anemia Presents for follow-up visit. Symptoms include malaise/fatigue.  Hyperlipidemia This is a chronic problem. The current episode started more than 1 year ago. The problem is controlled. Recent lipid tests were reviewed and are normal. Associated symptoms include leg pain. Current antihyperlipidemic treatment includes statins. The current treatment provides moderate improvement of lipids. Risk factors for coronary artery disease include dyslipidemia, hypertension, a sedentary lifestyle and post-menopausal.  Back Pain This is a chronic problem. The current episode started more than 1 year ago. The problem occurs intermittently. The problem has been waxing and waning since onset. The pain is present in the lumbar spine. The quality of the pain is described as aching. The pain is at a severity of 9/10. The pain is moderate. Associated symptoms include leg pain. She has tried NSAIDs for the symptoms. The treatment provided moderate relief.  Anxiety Presents for follow-up  visit. Symptoms include excessive worry, irritability and restlessness. Patient reports no depressed mood or nervous/anxious behavior. Symptoms occur most days. The severity of symptoms is moderate.   Her past medical history is significant for anemia.  Depression        This is a chronic problem.  The current episode started more than 1 year ago.   The problem occurs intermittently.  Associated symptoms include fatigue and restlessness.  Associated symptoms include no helplessness, no hopelessness and not sad.  Past treatments include SSRIs - Selective serotonin reuptake inhibitors.  Past medical history includes thyroid problem and anxiety.       Review of Systems  Constitutional:  Positive for fatigue, irritability and malaise/fatigue.  Musculoskeletal:  Positive for back pain.  Psychiatric/Behavioral:  Positive for depression. The patient is not nervous/anxious.   All other systems reviewed and are negative.  Family History  Problem Relation Age of Onset   Cancer Mother        stomach   Heart disease Father    Cancer Maternal Aunt        ovarian   Cancer Paternal Aunt        breast   Diabetes Brother    Hypertension Brother    Social History   Socioeconomic History   Marital status: Significant Other    Spouse name: Not on file   Number of children: 1   Years of education: 12   Highest education level: High school graduate  Occupational History   Occupation: Retired     Fish farm manager: [employer]   Occupation: Retired    Comment: Actuary  Tobacco Use   Smoking status: Some Days    Packs/day: 0.25    Years: 36.00    Total pack years: 9.00    Types: Cigarettes    Start date: 09/30/1973    Last attempt to quit: 04/26/2018    Years since quitting: 3.8   Smokeless tobacco: Never   Tobacco comments:    a pack per month  Vaping Use   Vaping Use: Never used  Substance and Sexual Activity   Alcohol use: No    Alcohol/week: 0.0 standard drinks of alcohol   Drug use: No    Sexual activity: Not on file  Other Topics Concern   Not on file  Social History Narrative   3 grandchildren. Enjoys dancing and going out to eat.    Social Determinants of Health   Financial Resource Strain: Low Risk  (03/17/2021)   Overall Financial Resource Strain (CARDIA)    Difficulty of Paying Living Expenses: Not hard at all  Food Insecurity: No Food Insecurity (03/17/2021)   Hunger Vital Sign    Worried About Running Out of Food in the Last Year: Never true    Ran Out of Food in the Last Year: Never true  Transportation Needs: No Transportation Needs (03/17/2021)   PRAPARE - Hydrologist (Medical): No    Lack of Transportation (Non-Medical): No  Physical Activity: Sufficiently Active (03/17/2021)   Exercise Vital Sign    Days of Exercise per Week: 5 days    Minutes of Exercise per Session: 30 min  Stress: No Stress Concern Present (03/17/2021)   [place]    Feeling of Stress : Not at all  Social Connections: Moderately Integrated (03/17/2021)   Social Connection and Isolation Panel [NHANES]    Frequency of Communication with Friends and Family: More than three times a week    Frequency of Social Gatherings with Friends and Family: More than three times a week    Attends Religious Services: More than 4 times per year    Active Member of [employer] or Organizations: Yes    Attends Music therapist: More than 4 times per year    Marital Status: Divorced       Objective:   Physical Exam Vitals reviewed.  Constitutional:      General: She is not in acute distress.    Appearance: She is well-developed.  HENT:     Head: Normocephalic and atraumatic.     Right Ear: Tympanic membrane normal.     Left Ear: Tympanic membrane normal.  Eyes:     Pupils: Pupils are equal, round, and reactive to light.  Neck:     Thyroid: No thyromegaly.  Cardiovascular:     Rate and Rhythm:  Normal rate and regular rhythm.     Heart sounds: Normal heart sounds. No murmur heard. Pulmonary:     Effort: Pulmonary effort is normal. No respiratory distress.     Breath sounds: Normal breath sounds. No wheezing.  Abdominal:     General: Bowel sounds are normal. There is no distension.     Palpations: Abdomen is soft.     Tenderness: There is no abdominal tenderness.  Musculoskeletal:        General: No tenderness. Normal range of motion.     Cervical back: Normal range of motion and neck supple.  Skin:    General: Skin is warm and dry.  Neurological:     Mental  Status: She is alert and oriented to person, place, and time.     Cranial Nerves: No cranial nerve deficit.     Deep Tendon Reflexes: Reflexes are normal and symmetric.  Psychiatric:        Behavior: Behavior normal.        Thought Content: Thought content normal.        Judgment: Judgment normal.       BP 110/75   Pulse 69   Temp (!) 97 F (36.1 C) (Temporal)   Ht '5\' 2"'$  (1.575 m)   Wt 144 lb 9.6 oz (65.6 kg)   SpO2 98%   BMI 26.45 kg/m      Assessment & Plan:  DAKOTAH HEIMAN comes in today with chief complaint of Medical Management of Chronic Issues   Diagnosis and orders addressed:  1. GAD (generalized anxiety disorder) - ALPRAZolam (XANAX) 0.5 MG tablet; Take 1 tablet (0.5 mg total) by mouth at bedtime as needed for anxiety.  Dispense: 20 tablet; Refill: 1 - sertraline (ZOLOFT) 100 MG tablet; Take 1 tablet (100 mg total) by mouth daily.  Dispense: 90 tablet; Refill: 0 - CMP14+EGFR - CBC with Differential/Platelet  2. Controlled substance agreement signed  - ALPRAZolam (XANAX) 0.5 MG tablet; Take 1 tablet (0.5 mg total) by mouth at bedtime as needed for anxiety.  Dispense: 20 tablet; Refill: 1 - CMP14+EGFR - CBC with Differential/Platelet  3. Hypothyroidism, unspecified type - levothyroxine (SYNTHROID) 75 MCG tablet; Take 1 tablet (75 mcg total) by mouth daily.  Dispense: 30 tablet; Refill:  11 - CMP14+EGFR - CBC with Differential/Platelet - TSH  4. Hyperlipidemia, unspecified hyperlipidemia type - pravastatin (PRAVACHOL) 40 MG tablet; Take 1 tablet (40 mg total) by mouth daily.  Dispense: 90 tablet; Refill: 1 - CMP14+EGFR - CBC with Differential/Platelet - Lipid panel  5. Depression, recurrent ([place]) - sertraline (ZOLOFT) 100 MG tablet; Take 1 tablet (100 mg total) by mouth daily.  Dispense: 90 tablet; Refill: 0 - CMP14+EGFR - CBC with Differential/Platelet  6. Anal sphincter incompetence - CMP14+EGFR - CBC with Differential/Platelet  7. Lumbar pain - CMP14+EGFR - CBC with Differential/Platelet  8. Malignant neoplasm of lower-inner quadrant of right breast of female, estrogen receptor positive ([place]) - CMP14+EGFR - CBC with Differential/Platelet  9. Osteopenia, unspecified location - CMP14+EGFR - CBC with Differential/Platelet - VITAMIN D 25 Hydroxy (Vit-D Deficiency, Fractures)  10. Simple chronic bronchitis ([facility]) - CMP14+EGFR - CBC with Differential/Platelet  11. Annual physical exam - CMP14+EGFR - CBC with Differential/Platelet - Lipid panel - TSH - VITAMIN D 25 Hydroxy (Vit-D Deficiency, Fractures)   Labs pending Patient reviewed in [place] controlled database, no flags noted. Contract and drug screen are up to date.  Health Maintenance reviewed Diet and exercise encouraged  Follow up plan: 3 months    Evelina Dun, FNP

## 2022-02-25 NOTE — Patient Instructions (Signed)
Health Maintenance After Age 72 After age 72, you are at a higher risk for certain long-term diseases and infections as well as injuries from falls. Falls are a major cause of broken bones and head injuries in people who are older than age 72. Getting regular preventive care can help to keep you healthy and well. Preventive care includes getting regular testing and making lifestyle changes as recommended by your health care provider. Talk with your health care provider about: Which screenings and tests you should have. A screening is a test that checks for a disease when you have no symptoms. A diet and exercise plan that is right for you. What should I know about screenings and tests to prevent falls? Screening and testing are the best ways to find a health problem early. Early diagnosis and treatment give you the best chance of managing medical conditions that are common after age 72. Certain conditions and lifestyle choices may make you more likely to have a fall. Your health care provider may recommend: Regular vision checks. Poor vision and conditions such as cataracts can make you more likely to have a fall. If you wear glasses, make sure to get your prescription updated if your vision changes. Medicine review. Work with your health care provider to regularly review all of the medicines you are taking, including over-the-counter medicines. Ask your health care provider about any side effects that may make you more likely to have a fall. Tell your health care provider if any medicines that you take make you feel dizzy or sleepy. Strength and balance checks. Your health care provider may recommend certain tests to check your strength and balance while standing, walking, or changing positions. Foot health exam. Foot pain and numbness, as well as not wearing proper footwear, can make you more likely to have a fall. Screenings, including: Osteoporosis screening. Osteoporosis is a condition that causes  the bones to get weaker and break more easily. Blood pressure screening. Blood pressure changes and medicines to control blood pressure can make you feel dizzy. Depression screening. You may be more likely to have a fall if you have a fear of falling, feel depressed, or feel unable to do activities that you used to do. Alcohol use screening. Using too much alcohol can affect your balance and may make you more likely to have a fall. Follow these instructions at home: Lifestyle Do not drink alcohol if: Your health care provider tells you not to drink. If you drink alcohol: Limit how much you have to: 0-1 drink a day for women. 0-2 drinks a day for men. Know how much alcohol is in your drink. In the [place] one drink equals one 12 oz bottle of beer (355 mL), one 5 oz glass of wine (148 mL), or one 1 oz glass of hard liquor (44 mL). Do not use any products that contain nicotine or tobacco. These products include cigarettes, chewing tobacco, and vaping devices, such as e-cigarettes. If you need help quitting, ask your health care provider. Activity  Follow a regular exercise program to stay fit. This will help you maintain your balance. Ask your health care provider what types of exercise are appropriate for you. If you need a cane or walker, use it as recommended by your health care provider. Wear supportive shoes that have nonskid soles. Safety  Remove any tripping hazards, such as rugs, cords, and clutter. Install safety equipment such as grab bars in bathrooms and safety rails on stairs. Keep rooms and walkways   well-lit. General instructions Talk with your health care provider about your risks for falling. Tell your health care provider if: You fall. Be sure to tell your health care provider about all falls, even ones that seem minor. You feel dizzy, tiredness (fatigue), or off-balance. Take over-the-counter and prescription medicines only as told by your health care provider. These include  supplements. Eat a healthy diet and maintain a healthy weight. A healthy diet includes low-fat dairy products, low-fat (lean) meats, and fiber from whole grains, beans, and lots of fruits and vegetables. Stay current with your vaccines. Schedule regular health, dental, and eye exams. Summary Having a healthy lifestyle and getting preventive care can help to protect your health and wellness after age 72. Screening and testing are the best way to find a health problem early and help you avoid having a fall. Early diagnosis and treatment give you the best chance for managing medical conditions that are more common for people who are older than age 72. Falls are a major cause of broken bones and head injuries in people who are older than age 72. Take precautions to prevent a fall at home. Work with your health care provider to learn what changes you can make to improve your health and wellness and to prevent falls. This information is not intended to replace advice given to you by your health care provider. Make sure you discuss any questions you have with your health care provider. Document Revised: 05/26/2020 Document Reviewed: 05/26/2020 Elsevier Patient Education  2023 [employer]  

## 2022-02-25 NOTE — Progress Notes (Signed)
Office Visit Note   Patient: Jaclyn Schmidt           Date of Birth: [birth year]           MRN: [id number] Visit Date: 02/25/2022              Requested by: Sharion Balloon, [address],  [place] [address] PCP: Sharion Balloon, FNP   Assessment & Plan: Visit Diagnoses:  1. Pain in right leg   2. Trochanteric bursitis, right hip   3. Lumbar pain     Plan: Trochanteric injection performed she is having claudication symptoms we will check her back again in 4 weeks.  She notes persistent claudication symptoms we will need to proceed with lumbar MRI imaging.  Follow-Up Instructions: Return in about 4 weeks (around 03/25/2022).   Orders:  Orders Placed This Encounter  Procedures   Large Joint Inj   XR HIP UNILAT W OR W/O PELVIS 2-3 VIEWS RIGHT   XR Lumbar Spine 2-3 Views   No orders of the defined types were placed in this encounter.     Procedures: Large Joint Inj: R greater trochanter on 02/25/2022 11:39 AM Details: 22 G needle, lateral approach Medications: 1 mL lidocaine 1 %; 2 mL bupivacaine 0.25 %; 40 mg methylPREDNISolone acetate 40 MG/ML      Clinical Data: No additional findings.   Subjective: Chief Complaint  Patient presents with   Right Leg - Pain    HPI 72 year old female returns with claudication she states she can only stand 3 to 4 minutes and then has to stop and sit for 5.  When she walks to the barn she has to stop and sit and when she is in the barn working she has to intermittently stop sit wait 5 minutes and then she is upright again.  She leans over a grocery cart ambulation.  Previous x-rays showed some narrowing at L2-3.  New images today shows slight retrolisthesis at L2-3.  Review of Systems all systems noncontributory to HPI.  Previous two-level cervical fusion doing well.  Breast cancer right breast estrogen receptor +2020.   Objective: Vital Signs: Ht '5\' 3"'$  (1.6 m)   Wt 150 lb (68 kg)   BMI 26.57 kg/m   Physical  Exam Constitutional:      Appearance: She is well-developed.  HENT:     Head: Normocephalic.     Right Ear: External ear normal.     Left Ear: External ear normal. There is no impacted cerumen.  Eyes:     Pupils: Pupils are equal, round, and reactive to light.  Neck:     Thyroid: No thyromegaly.     Trachea: No tracheal deviation.  Cardiovascular:     Rate and Rhythm: Normal rate.  Pulmonary:     Effort: Pulmonary effort is normal.  Abdominal:     Palpations: Abdomen is soft.  Musculoskeletal:     Cervical back: No rigidity.  Skin:    General: Skin is warm and dry.  Neurological:     Mental Status: She is alert and oriented to person, place, and time.  Psychiatric:        Behavior: Behavior normal.     Ortho Exam negative logroll to hips.  Patient has some discomfort radiates along the medial thigh anterior groin and lateral over the trochanter minimally over the buttocks.  Negative straight leg raising negative or straight leg raising knee and ankle jerk are intact.  No hip flexion  weakness.  Adductor to the hip right and left are strong.  Trochanteric bursal tenderness on the right side negative on the left.  Pulses are 2+.  Specialty Comments:  No specialty comments available.  Imaging: XR Lumbar Spine 2-3 Views  Result Date: 02/25/2022 AP lateral lumbar spine images are obtained and reviewed.  This shows previous gallbladder and appendix metal clips.  There is 2 mm retrolisthesis with disc base narrowing at L2-3.  Mild facet degenerative changes noted. Impression: L2-3 disc degeneration with slight retrolisthesis.  XR HIP UNILAT W OR W/O PELVIS 2-3 VIEWS RIGHT  Result Date: 02/25/2022 AP pelvis view hips frog-leg lateral right hip are obtained reviewed this shows normal hip joint no degenerative changes negative for fracture no lytic or sclerotic lesions. Impression: Normal right hip radiographs.    PMFS History: Patient Active Problem List   Diagnosis Date Noted    Trochanteric bursitis, right hip 02/25/2022   Simple chronic bronchitis ([place]) 08/25/2021   Vitamin B 12 deficiency 03/06/2020   Hyperlipidemia 01/04/2020   Left tennis elbow 11/15/2019   Impingement syndrome of right shoulder 08/13/2019   Malignant neoplasm of lower-inner quadrant of right breast of female, estrogen receptor positive ([place]) 04/26/2018   Vertigo 09/05/2017   Hypothyroidism 07/18/2017   Osteopenia 03/09/2017   Anal sphincter incompetence 12/29/2016   Depression, recurrent ([facility]) 12/29/2016   Chronic midline thoracic back pain 09/21/2016   Lumbar pain 09/21/2016   Cholelithiasis 06/03/2011   Past Medical History:  Diagnosis Date   Anxiety    Breast cancer ([facility]) 05/2018   Right Breast Cancer   Cancer ([facility]) 04/2018   right breast cancer   Cholelithiasis    Depression    GERD (gastroesophageal reflux disease)    History of kidney stones    Hyperlipidemia    Hypothyroidism    Irritable bowel syndrome    Personal history of radiation therapy 2020   Right Breast Cancer   Pneumonia    Thyroid disease    Vitamin D deficiency     Family History  Problem Relation Age of Onset   Cancer Mother        stomach   Heart disease Father    Cancer Maternal Aunt        ovarian   Cancer Paternal Aunt        breast   Diabetes Brother    Hypertension Brother     Past Surgical History:  Procedure Laterality Date   ABDOMINAL HYSTERECTOMY  [id number]   ANTERIOR CERVICAL DECOMP/DISCECTOMY FUSION N/A 01/21/2017   Procedure: C3-4, C4-5 ANTERIOR CERVICAL DISCECTOMY AND FUSION, ALLOGRAFT, PLATE;  Surgeon: [name], MD;  Location: [facility];  Service: Orthopedics;  Laterality: N/A;   APPENDECTOMY  1996   BREAST LUMPECTOMY Right 05/30/2018   BREAST LUMPECTOMY WITH RADIOACTIVE SEED AND SENTINEL LYMPH NODE BIOPSY Right 05/30/2018   Procedure: RIGHT BREAST LUMPECTOMY WITH RADIOACTIVE SEED AND RIGHT AXILLARY SENTINEL LYMPH NODE BIOPSY;  Surgeon: Alphonsa Overall, MD;  Location: [facility];  Service: General;  Laterality: Right;   BREAST MASS EXCISION  1997   left   CHOLECYSTECTOMY  07/15/2011   Procedure: LAPAROSCOPIC CHOLECYSTECTOMY WITH INTRAOPERATIVE CHOLANGIOGRAM;  Surgeon: Shann Medal, MD;  Location: [facility];  Service: General;  Laterality: N/A;  Cholecystectomy with IOC   TOE FUSION     TUBAL LIGATION  1980   Social History   Occupational History   Occupation: Retired     Fish farm manager: [employer]   Occupation: Retired  Comment: sitter  Tobacco Use   Smoking status: Some Days    Packs/day: 0.25    Years: 36.00    Total pack years: 9.00    Types: Cigarettes    Start date: 09/30/1973    Last attempt to quit: 04/26/2018    Years since quitting: 3.8   Smokeless tobacco: Never   Tobacco comments:    a pack per month  Vaping Use   Vaping Use: Never used  Substance and Sexual Activity   Alcohol use: No    Alcohol/week: 0.0 standard drinks of alcohol   Drug use: No   Sexual activity: Not on file

## 2022-02-26 LAB — CBC WITH DIFFERENTIAL/PLATELET
Basophils Absolute: 0 10*3/uL (ref 0.0–0.2)
Basos: 0 %
EOS (ABSOLUTE): 0 10*3/uL (ref 0.0–0.4)
Eos: 0 %
Hematocrit: 42.4 % (ref 34.0–46.6)
Hemoglobin: 14.2 g/dL (ref 11.1–15.9)
Immature Grans (Abs): 0 10*3/uL (ref 0.0–0.1)
Immature Granulocytes: 1 %
Lymphocytes Absolute: 0.6 10*3/uL — ABNORMAL LOW (ref 0.7–3.1)
Lymphs: 15 %
MCH: 29.6 pg (ref 26.6–33.0)
MCHC: 33.5 g/dL (ref 31.5–35.7)
MCV: 88 fL (ref 79–97)
Monocytes Absolute: 0.1 10*3/uL (ref 0.1–0.9)
Monocytes: 2 %
Neutrophils Absolute: 3.4 10*3/uL (ref 1.4–7.0)
Neutrophils: 82 %
Platelets: 276 10*3/uL (ref 150–450)
RBC: 4.8 x10E6/uL (ref 3.77–5.28)
RDW: 12.1 % (ref 11.7–15.4)
WBC: 4.1 10*3/uL (ref 3.4–10.8)

## 2022-02-26 LAB — LIPID PANEL
Chol/HDL Ratio: 4.3 ratio (ref 0.0–4.4)
Cholesterol, Total: 199 mg/dL (ref 100–199)
HDL: 46 mg/dL (ref >39)
LDL Chol Calc (NIH): 130 mg/dL — ABNORMAL HIGH (ref 0–99)
Triglycerides: 126 mg/dL (ref 0–149)
VLDL Cholesterol Cal: 23 mg/dL (ref 5–40)

## 2022-02-26 LAB — CMP14+EGFR
ALT: 13 IU/L (ref 0–32)
AST: 16 IU/L (ref 0–40)
Albumin/Globulin Ratio: 1.7 (ref 1.2–2.2)
Albumin: 4.7 g/dL (ref 3.8–4.8)
Alkaline Phosphatase: 107 IU/L (ref 44–121)
BUN/Creatinine Ratio: 18 (ref 12–28)
BUN: 14 mg/dL (ref 8–27)
Bilirubin Total: 0.5 mg/dL (ref 0.0–1.2)
CO2: 22 mmol/L (ref 20–29)
Calcium: 9.4 mg/dL (ref 8.7–10.3)
Chloride: 103 mmol/L (ref 96–106)
Creatinine, Ser: 0.76 mg/dL (ref 0.57–1.00)
Globulin, Total: 2.7 g/dL (ref 1.5–4.5)
Glucose: 119 mg/dL — ABNORMAL HIGH (ref 70–99)
Potassium: 4.8 mmol/L (ref 3.5–5.2)
Sodium: 139 mmol/L (ref 134–144)
Total Protein: 7.4 g/dL (ref 6.0–8.5)
eGFR: 84 mL/min/{1.73_m2} (ref >59)

## 2022-02-26 LAB — VITAMIN D 25 HYDROXY (VIT D DEFICIENCY, FRACTURES): Vit D, 25-Hydroxy: 47 ng/mL (ref 30.0–100.0)

## 2022-02-26 LAB — TSH: TSH: 0.502 u[IU]/mL (ref 0.450–4.500)

## 2022-03-05 ENCOUNTER — Ambulatory Visit (INDEPENDENT_AMBULATORY_CARE_PROVIDER_SITE_OTHER): Payer: Medicare HMO

## 2022-03-05 DIAGNOSIS — E538 Deficiency of other specified B group vitamins: Secondary | ICD-10-CM

## 2022-03-05 NOTE — Progress Notes (Signed)
Cyanocobalamin injection given to left deltoid.  Patient tolerated well. 

## 2022-03-07 DIAGNOSIS — R5383 Other fatigue: Secondary | ICD-10-CM | POA: Diagnosis not present

## 2022-03-07 DIAGNOSIS — E7800 Pure hypercholesterolemia, unspecified: Secondary | ICD-10-CM | POA: Diagnosis not present

## 2022-03-07 DIAGNOSIS — R030 Elevated blood-pressure reading, without diagnosis of hypertension: Secondary | ICD-10-CM | POA: Diagnosis not present

## 2022-03-07 DIAGNOSIS — R69 Illness, unspecified: Secondary | ICD-10-CM | POA: Diagnosis not present

## 2022-03-07 DIAGNOSIS — R0602 Shortness of breath: Secondary | ICD-10-CM | POA: Diagnosis not present

## 2022-03-07 DIAGNOSIS — Z6827 Body mass index (BMI) 27.0-27.9, adult: Secondary | ICD-10-CM | POA: Diagnosis not present

## 2022-03-07 DIAGNOSIS — Z131 Encounter for screening for diabetes mellitus: Secondary | ICD-10-CM | POA: Diagnosis not present

## 2022-03-07 DIAGNOSIS — E559 Vitamin D deficiency, unspecified: Secondary | ICD-10-CM | POA: Diagnosis not present

## 2022-03-07 DIAGNOSIS — Z79899 Other long term (current) drug therapy: Secondary | ICD-10-CM | POA: Diagnosis not present

## 2022-03-19 ENCOUNTER — Telehealth: Payer: Self-pay | Admitting: Family

## 2022-03-19 ENCOUNTER — Ambulatory Visit: Payer: Medicare HMO

## 2022-03-19 NOTE — Telephone Encounter (Signed)
[place] to schedule their annual wellness visit. Appointment made for 04/12/2022.  Thank you,  Colletta Maryland,  [facility] Direct Dial ??[phone]

## 2022-03-19 NOTE — Progress Notes (Unsigned)
Subjective:   Jaclyn Schmidt is a 72 y.o. female who presents for Medicare Annual (Subsequent) preventive examination.  Review of Systems    ***       Objective:    There were no vitals filed for this visit. There is no height or weight on file to calculate BMI.     03/17/2021    8:25 AM 03/14/2020    8:18 AM 01/12/2020    9:58 AM 03/13/2019    8:49 AM 09/13/2018    3:52 PM 05/30/2018   10:50 AM 05/24/2018   11:40 AM  Advanced Directives  Does Patient Have a Medical Advance Directive? No No No No No No No  Would patient like information on creating a medical advance directive? No - Patient declined No - Patient declined  No - Patient declined No - Patient declined No - Patient declined No - Patient declined    Current Medications (verified) Outpatient Encounter Medications as of 03/19/2022  Medication Sig   ALPRAZolam (XANAX) 0.5 MG tablet Take 1 tablet (0.5 mg total) by mouth at bedtime as needed for anxiety.   anastrozole (ARIMIDEX) 1 MG tablet Take 1 tablet by mouth daily.   BIOTIN PO Take 2 tablets by mouth daily.   calcium carbonate (OSCAL) 1500 (600 Ca) MG TABS tablet Take by mouth 2 (two) times daily with a meal.   cetirizine (ZYRTEC) 10 MG tablet Take 10 mg by mouth daily.   Cholecalciferol (VITAMIN D3 PO) Take 4,000 Int'l Units by mouth daily.   fluticasone (FLONASE) 50 MCG/ACT nasal spray PLACE 2 SPRAYS INTO BOTH NOSTRILS DAILY AS NEEDED (FOR ALLERGIES.).   gabapentin (NEURONTIN) 300 MG capsule TAKE (2) CAPSULES TWICE DAILY.   levothyroxine (SYNTHROID) 75 MCG tablet Take 1 tablet (75 mcg total) by mouth daily.   meclizine (ANTIVERT) 25 MG tablet TAKE 1 TABLET BY MOUTH 3 TIMES A DAY AS NEEDED FOR DIZZINESS   meloxicam (MOBIC) 15 MG tablet Take 1 tablet (15 mg total) by mouth daily.   pravastatin (PRAVACHOL) 40 MG tablet Take 1 tablet (40 mg total) by mouth daily.   sertraline (ZOLOFT) 100 MG tablet Take 1 tablet (100 mg total) by mouth daily.   triamcinolone ointment  (KENALOG) 0.5 % Apply 1 application topically 2 (two) times daily.   Facility-Administered Encounter Medications as of 03/19/2022  Medication   cyanocobalamin ((VITAMIN B-12)) injection 1,000 mcg    Allergies (verified) Penicillins and Doxycycline   History: Past Medical History:  Diagnosis Date   Anxiety    Breast cancer ([facility]) 05/2018   Right Breast Cancer   Cancer ([facility]) 04/2018   right breast cancer   Cholelithiasis    Depression    GERD (gastroesophageal reflux disease)    History of kidney stones    Hyperlipidemia    Hypothyroidism    Irritable bowel syndrome    Personal history of radiation therapy 2020   Right Breast Cancer   Pneumonia    Thyroid disease    Vitamin D deficiency    Past Surgical History:  Procedure Laterality Date   ABDOMINAL HYSTERECTOMY  [id number]   ANTERIOR CERVICAL DECOMP/DISCECTOMY FUSION N/A 01/21/2017   Procedure: C3-4, C4-5 ANTERIOR CERVICAL DISCECTOMY AND FUSION, ALLOGRAFT, PLATE;  Surgeon: [name], MD;  Location: [facility];  Service: Orthopedics;  Laterality: N/A;   APPENDECTOMY  1996   BREAST LUMPECTOMY Right 05/30/2018   BREAST LUMPECTOMY WITH RADIOACTIVE SEED AND SENTINEL LYMPH NODE BIOPSY Right 05/30/2018   Procedure: RIGHT BREAST LUMPECTOMY WITH RADIOACTIVE  SEED AND RIGHT AXILLARY SENTINEL LYMPH NODE BIOPSY;  Surgeon: Alphonsa Overall, MD;  Location: [facility];  Service: General;  Laterality: Right;   BREAST MASS EXCISION  1997   left   CHOLECYSTECTOMY  07/15/2011   Procedure: LAPAROSCOPIC CHOLECYSTECTOMY WITH INTRAOPERATIVE CHOLANGIOGRAM;  Surgeon: Shann Medal, MD;  [facility];  Service: General;  Laterality: N/A;  Cholecystectomy with IOC   TOE FUSION     TUBAL LIGATION  1980   Family History  Problem Relation Age of Onset   Cancer Mother        stomach   Heart disease Father    Cancer Maternal Aunt        ovarian   Cancer Paternal Aunt        breast   Diabetes Brother    Hypertension Brother    Social  History   Socioeconomic History   Marital status: Significant Other    Spouse name: Not on file   Number of children: 1   Years of education: 12   Highest education level: High school graduate  Occupational History   Occupation: Retired     Fish farm manager: [employer]   Occupation: Retired    Comment: Actuary  Tobacco Use   Smoking status: Some Days    Packs/day: 0.25    Years: 36.00    Total pack years: 9.00    Types: Cigarettes    Start date: 09/30/1973    Last attempt to quit: 04/26/2018    Years since quitting: 3.8   Smokeless tobacco: Never   Tobacco comments:    a pack per month  Vaping Use   Vaping Use: Never used  Substance and Sexual Activity   Alcohol use: No    Alcohol/week: 0.0 standard drinks of alcohol   Drug use: No   Sexual activity: Not on file  Other Topics Concern   Not on file  Social History Narrative   3 grandchildren. Enjoys dancing and going out to eat.    Social Determinants of Health   Financial Resource Strain: Low Risk  (03/17/2021)   Overall Financial Resource Strain (CARDIA)    Difficulty of Paying Living Expenses: Not hard at all  Food Insecurity: No Food Insecurity (03/17/2021)   Hunger Vital Sign    Worried About Running Out of Food in the Last Year: Never true    Ran Out of Food in the Last Year: Never true  Transportation Needs: No Transportation Needs (03/17/2021)   PRAPARE - Hydrologist (Medical): No    Lack of Transportation (Non-Medical): No  Physical Activity: Sufficiently Active (03/17/2021)   Exercise Vital Sign    Days of Exercise per Week: 5 days    Minutes of Exercise per Session: 30 min  Stress: No Stress Concern Present (03/17/2021)   [place]    Feeling of Stress : Not at all  Social Connections: Moderately Integrated (03/17/2021)   Social Connection and Isolation Panel [NHANES]    Frequency of Communication with Friends and Family:  More than three times a week    Frequency of Social Gatherings with Friends and Family: More than three times a week    Attends Religious Services: More than 4 times per year    Active Member of [employer] or Organizations: Yes    Attends Music therapist: More than 4 times per year    Marital Status: Divorced    Tobacco Counseling Ready  to quit: Not Answered Counseling given: Not Answered Tobacco comments: a pack per month   Clinical Intake:                 Diabetic?***         Activities of Daily Living     No data to display          Patient Care Team: Sharion Balloon, FNP as PCP - General (Family Medicine) [name], PA-C as Referring Physician (General Practice) [name], MD as Consulting Physician (Orthopedic Surgery) [name], RN as Oncology Nurse Navigator [name], RN as Oncology Nurse Navigator [name], MD as Consulting Physician (Radiation Oncology) Ala Dach, MD as Referring Physician (Hematology and Oncology) Gari Crown, MD as Referring Physician (Obstetrics and Gynecology) Lavonna Monarch, MD (Inactive) as Consulting Physician (Dermatology) Kerry Kass, MD as Referring Physician (Surgery)  Indicate any recent Medical Services you may have received from other than [facility] providers in the past year (date may be approximate).     Assessment:   This is a routine wellness examination for Jaclyn Schmidt.  Hearing/Vision screen No results found.  Dietary issues and exercise activities discussed:     Goals Addressed   None    Depression Screen    03/17/2021    8:24 AM 12/18/2020    3:12 PM 09/02/2020    3:22 PM 06/20/2020    2:42 PM 03/14/2020    8:19 AM 03/06/2020    2:27 PM 01/22/2020    3:06 PM  PHQ 2/9 Scores  PHQ - 2 Score 0 0 0 0 0 0 0  PHQ- 9 Score  1 0 2       Fall Risk    03/17/2021    8:21 AM 06/20/2020    2:42 PM 03/14/2020    8:19 AM 03/06/2020    2:27 PM 01/22/2020    3:06  PM  [place] in the past year? 0 0 0 0 0  Number falls in past yr: 0      Injury with Fall? 0      Risk for fall due to : Medication side effect;Orthopedic patient      Follow up Falls prevention discussed        [place]:  Any stairs in or around the home? {YES/NO:21197} If so, are there any without handrails? {YES/NO:21197} Home free of loose throw rugs in walkways, pet beds, electrical cords, etc? {YES/NO:21197} Adequate lighting in your home to reduce risk of falls? {YES/NO:21197}  ASSISTIVE DEVICES UTILIZED TO PREVENT FALLS:  Life alert? {YES/NO:21197} Use of a cane, walker or w/c? {YES/NO:21197} Grab bars in the bathroom? {YES/NO:21197} Shower chair or bench in shower? {YES/NO:21197} Elevated toilet seat or a handicapped toilet? {YES/NO:21197}  TIMED UP AND GO:  Was the test performed? {YES/NO:21197}.  Length of time to ambulate 10 feet: *** sec.   {Appearance of GA:4730917  Cognitive Function:    02/07/2018    4:41 PM  MMSE - Mini Mental State Exam  Orientation to time 5  Orientation to Place 5  Registration 3  Attention/ Calculation 5  Recall 2  Language- name 2 objects 2  Language- repeat 1  Language- follow 3 step command 3  Language- read & follow direction 1  Write a sentence 1  Copy design 1  Total score 29        03/17/2021    8:33 AM 03/14/2020  8:22 AM 03/13/2019    8:54 AM  6CIT Screen  What Year? 0 points 0 points 0 points  What month? 0 points 0 points 0 points  What time? 0 points 0 points 0 points  Count back from 20 0 points 0 points 0 points  Months in reverse 0 points 0 points 0 points  Repeat phrase 2 points 0 points 0 points  Total Score 2 points 0 points 0 points    Immunizations Immunization History  Administered Date(s) Administered   Fluad Quad(high Dose 65+) 10/26/2019, 10/26/2019, 11/03/2020, 12/18/2021   Influenza Inj Mdck Quad Pf 10/19/2016   Influenza, High Dose  Seasonal PF 11/07/2017, 11/07/2017, 10/09/2018   Influenza-Unspecified 10/30/2015   Moderna Sars-Covid-2 Vaccination 02/21/2019, 03/22/2019, 12/11/2019   Pneumococcal Conjugate-13 11/07/2017   Pneumococcal Polysaccharide-23 06/26/2019   Tdap 02/19/2020   Zoster Recombinat (Shingrix) 08/27/2020, 01/27/2021   Zoster, Live 10/29/2014    {TDAP status:2101805}  {Flu Vaccine status:2101806}  {Pneumococcal vaccine status:2101807}  {Covid-19 vaccine status:2101808}  Qualifies for Shingles Vaccine? {YES/NO:21197}  Zostavax completed {YES/NO:21197}  {Shingrix Completed?:2101804}  Screening Tests Health Maintenance  Topic Date Due   COVID-19 Vaccine (4 - 2023-24 season) 09/18/2021   Medicare Annual Wellness (AWV)  03/17/2022   MAMMOGRAM  06/10/2022   DEXA SCAN  08/26/2023   DTaP/Tdap/Td (2 - Td or Tdap) 02/18/2030   COLONOSCOPY (Pts 45-7yr Insurance coverage will need to be confirmed)  12/30/2030   Pneumonia Vaccine 72 Years old  Completed   INFLUENZA VACCINE  Completed   Hepatitis C Screening  Completed   Zoster Vaccines- Shingrix  Completed   HPV VACCINES  Aged Out    Health Maintenance  Health Maintenance Due  Topic Date Due   COVID-19 Vaccine (4 - 2023-24 season) 09/18/2021   Medicare Annual Wellness (AWV)  03/17/2022    {Colorectal cancer screening:2101809}  {Mammogram status:21018020}  {Bone Density status:21018021}  Lung Cancer Screening: (Low Dose CT Chest recommended if Age 72-80years, 30 pack-year currently smoking OR have quit w/in 15years.) {DOES NOT does:27190::"does not"} qualify.   Lung Cancer Screening Referral: ***  Additional Screening:  Hepatitis C Screening: {DOES NOT does:27190::"does not"} qualify; Completed ***  Vision Screening: Recommended annual ophthalmology exams for early detection of glaucoma and other disorders of the eye. Is the patient up to date with their annual eye exam?  {YES/NO:21197} Who is the provider or what is the name  of the office in which the patient attends annual eye exams? *** If pt is not established with a provider, would they like to be referred to a provider to establish care? {YES/NO:21197}.   Dental Screening: Recommended annual dental exams for proper oral hygiene  Community Resource Referral / Chronic Care Management: CRR required this visit?  {YES/NO:21197}  CCM required this visit?  {YES/NO:21197}     Plan:     I have personally reviewed and noted the following in the patient's chart:   Medical and social history Use of alcohol, tobacco or illicit drugs  Current medications and supplements including opioid prescriptions. {Opioid Prescriptions:8281094840} Functional ability and status Nutritional status Physical activity Advanced directives List of other physicians Hospitalizations, surgeries, and ER visits in previous 12 months Vitals Screenings to include cognitive, depression, and falls Referrals and appointments  In addition, I have reviewed and discussed with patient certain preventive protocols, quality metrics, and best practice recommendations. A written personalized care plan for preventive services as well as general preventive health recommendations were provided to patient.     LBarnabas Lister  [place], [place]   [id number]   Nurse Notes: ***

## 2022-03-25 ENCOUNTER — Encounter: Payer: Self-pay | Admitting: Orthopaedic Surgery

## 2022-03-25 ENCOUNTER — Ambulatory Visit (INDEPENDENT_AMBULATORY_CARE_PROVIDER_SITE_OTHER): Payer: Medicare HMO | Admitting: Orthopaedic Surgery

## 2022-03-25 VITALS — Ht 62.0 in | Wt 144.0 lb

## 2022-03-25 DIAGNOSIS — M7061 Trochanteric bursitis, right hip: Secondary | ICD-10-CM

## 2022-03-26 NOTE — Progress Notes (Signed)
Office Visit Note   Patient: Jaclyn Schmidt           Date of Birth: 1950/08/03           MRN: [id number] Visit Date: 03/25/2022              Requested by: Sharion Balloon, [address] [place],  [place] [id number] PCP: Sharion Balloon, FNP   Assessment & Plan: Visit Diagnoses:  1. Trochanteric bursitis, right hip     Plan: Patient likely has some central and foraminal stenosis on the right lumbar spine.  She states she is moving well enough and is not interested in the surgery at this point.  Will see how she does and she has increasing problems she can call and we can proceed with MRI scan lumbar spine.  Patient is happy with the cervical fusion over 2019.  Follow-Up Instructions: No follow-ups on file.   Orders:  No orders of the defined types were placed in this encounter.  No orders of the defined types were placed in this encounter.     Procedures: No procedures performed   Clinical Data: No additional findings.   Subjective: Chief Complaint  Patient presents with   Right Leg - Follow-up, Pain    HPI 72 year old female returns for trochanteric injection 02/26/2027.greater than 50 improvement.  She is moving better still has claudication after walking for several minutes but gets relief when she sits for about 5 minutes.  She does well leaning on a grocery cart.  She had breast cancer estrogen receptor positive surgery done in 2020.  Two-level cervical fusion doing well she denies upper extremity radicular symptoms.  Lumbar x-rays 02/25/2022 showed L2-3 disc degeneration with slight retrolisthesis.  Review of Systems all systems noncontributory to HPI.   Objective: Vital Signs: Ht '5\' 2"'$  (1.575 m)   Wt 144 lb (65.3 kg)   BMI 26.34 kg/m   Physical Exam Constitutional:      Appearance: She is well-developed.  HENT:     Head: Normocephalic.     Right Ear: External ear normal.     Left Ear: External ear normal. There is no impacted cerumen.  Eyes:      Pupils: Pupils are equal, round, and reactive to light.  Neck:     Thyroid: No thyromegaly.     Trachea: No tracheal deviation.  Cardiovascular:     Rate and Rhythm: Normal rate.  Pulmonary:     Effort: Pulmonary effort is normal.  Abdominal:     Palpations: Abdomen is soft.  Musculoskeletal:     Cervical back: No rigidity.  Skin:    General: Skin is warm and dry.  Neurological:     Mental Status: She is alert and oriented to person, place, and time.  Psychiatric:        Behavior: Behavior normal.     Ortho Exam lower extremity reflexes are symmetrical.  Still has some weakness with hip flexion on the right.  Mild tenderness over the trochanter she is walking without limp moving well.  Specialty Comments:  No specialty comments available.  Imaging: No results found.   PMFS History: Patient Active Problem List   Diagnosis Date Noted   Trochanteric bursitis, right hip 02/25/2022   GAD (generalized anxiety disorder) 02/25/2022   Controlled substance agreement signed 02/25/2022   Simple chronic bronchitis ([place]) 08/25/2021   Vitamin B 12 deficiency 03/06/2020   Hyperlipidemia 01/04/2020   Left tennis elbow 11/15/2019   Impingement  syndrome of right shoulder 08/13/2019   Malignant neoplasm of lower-inner quadrant of right breast of female, estrogen receptor positive ([place]) 04/26/2018   Vertigo 09/05/2017   Hypothyroidism 07/18/2017   Osteopenia 03/09/2017   Anal sphincter incompetence 12/29/2016   Depression, recurrent ([place]) 12/29/2016   Chronic midline thoracic back pain 09/21/2016   Lumbar pain 09/21/2016   Cholelithiasis 06/03/2011   Past Medical History:  Diagnosis Date   Anxiety    Breast cancer ([facility] [facility]) 05/2018   Right Breast Cancer   Cancer ([facility]) 04/2018   right breast cancer   Cholelithiasis    Depression    GERD (gastroesophageal reflux disease)    History of kidney stones    Hyperlipidemia    Hypothyroidism    Irritable bowel syndrome    Personal  history of radiation therapy 2020   Right Breast Cancer   Pneumonia    Thyroid disease    Vitamin D deficiency     Family History  Problem Relation Age of Onset   Cancer Mother        stomach   Heart disease Father    Cancer Maternal Aunt        ovarian   Cancer Paternal Aunt        breast   Diabetes Brother    Hypertension Brother     Past Surgical History:  Procedure Laterality Date   ABDOMINAL HYSTERECTOMY  [id number]   ANTERIOR CERVICAL DECOMP/DISCECTOMY FUSION N/A 01/21/2017   Procedure: C3-4, C4-5 ANTERIOR CERVICAL DISCECTOMY AND FUSION, ALLOGRAFT, PLATE;  Surgeon: [name], MD;  Location: [facility];  Service: Orthopedics;  Laterality: N/A;   APPENDECTOMY  1996   BREAST LUMPECTOMY Right 05/30/2018   BREAST LUMPECTOMY WITH RADIOACTIVE SEED AND SENTINEL LYMPH NODE BIOPSY Right 05/30/2018   Procedure: RIGHT BREAST LUMPECTOMY WITH RADIOACTIVE SEED AND RIGHT AXILLARY SENTINEL LYMPH NODE BIOPSY;  Surgeon: Alphonsa Overall, MD;  Location: [facility];  Service: General;  Laterality: Right;   BREAST MASS EXCISION  1997   left   CHOLECYSTECTOMY  07/15/2011   Procedure: LAPAROSCOPIC CHOLECYSTECTOMY WITH INTRAOPERATIVE CHOLANGIOGRAM;  Surgeon: Shann Medal, MD;  Location: [facility];  Service: General;  Laterality: N/A;  Cholecystectomy with IOC   TOE FUSION     TUBAL LIGATION  1980   Social History   Occupational History   Occupation: Retired     Fish farm manager: [employer]   Occupation: Retired    Comment: Actuary  Tobacco Use   Smoking status: Some Days    Packs/day: 0.25    Years: 36.00    Total pack years: 9.00    Types: Cigarettes    Start date: 09/30/1973    Last attempt to quit: 04/26/2018    Years since quitting: 3.9   Smokeless tobacco: Never   Tobacco comments:    a pack per month  Vaping Use   Vaping Use: Never used  Substance and Sexual Activity   Alcohol use: No    Alcohol/week: 0.0 standard drinks of alcohol   Drug use: No   Sexual activity: Not on file

## 2022-04-01 DIAGNOSIS — K219 Gastro-esophageal reflux disease without esophagitis: Secondary | ICD-10-CM | POA: Diagnosis not present

## 2022-04-01 DIAGNOSIS — R42 Dizziness and giddiness: Secondary | ICD-10-CM | POA: Diagnosis not present

## 2022-04-01 DIAGNOSIS — C50919 Malignant neoplasm of unspecified site of unspecified female breast: Secondary | ICD-10-CM | POA: Diagnosis not present

## 2022-04-01 DIAGNOSIS — Z008 Encounter for other general examination: Secondary | ICD-10-CM | POA: Diagnosis not present

## 2022-04-01 DIAGNOSIS — M1990 Unspecified osteoarthritis, unspecified site: Secondary | ICD-10-CM | POA: Diagnosis not present

## 2022-04-01 DIAGNOSIS — E785 Hyperlipidemia, unspecified: Secondary | ICD-10-CM | POA: Diagnosis not present

## 2022-04-01 DIAGNOSIS — G629 Polyneuropathy, unspecified: Secondary | ICD-10-CM | POA: Diagnosis not present

## 2022-04-01 DIAGNOSIS — E039 Hypothyroidism, unspecified: Secondary | ICD-10-CM | POA: Diagnosis not present

## 2022-04-01 DIAGNOSIS — Z79811 Long term (current) use of aromatase inhibitors: Secondary | ICD-10-CM | POA: Diagnosis not present

## 2022-04-01 DIAGNOSIS — I10 Essential (primary) hypertension: Secondary | ICD-10-CM | POA: Diagnosis not present

## 2022-04-01 DIAGNOSIS — R69 Illness, unspecified: Secondary | ICD-10-CM | POA: Diagnosis not present

## 2022-04-01 DIAGNOSIS — Z791 Long term (current) use of non-steroidal anti-inflammatories (NSAID): Secondary | ICD-10-CM | POA: Diagnosis not present

## 2022-04-01 DIAGNOSIS — F411 Generalized anxiety disorder: Secondary | ICD-10-CM | POA: Diagnosis not present

## 2022-04-01 DIAGNOSIS — Z881 Allergy status to other antibiotic agents status: Secondary | ICD-10-CM | POA: Diagnosis not present

## 2022-04-04 DIAGNOSIS — R030 Elevated blood-pressure reading, without diagnosis of hypertension: Secondary | ICD-10-CM | POA: Diagnosis not present

## 2022-04-04 DIAGNOSIS — Z6827 Body mass index (BMI) 27.0-27.9, adult: Secondary | ICD-10-CM | POA: Diagnosis not present

## 2022-04-06 ENCOUNTER — Ambulatory Visit (INDEPENDENT_AMBULATORY_CARE_PROVIDER_SITE_OTHER): Payer: Medicare HMO | Admitting: *Deleted

## 2022-04-06 DIAGNOSIS — E538 Deficiency of other specified B group vitamins: Secondary | ICD-10-CM | POA: Diagnosis not present

## 2022-04-06 NOTE — Progress Notes (Signed)
Pt given B12 in left deltoid, tolerated well.

## 2022-04-09 ENCOUNTER — Telehealth: Payer: Self-pay | Admitting: Orthopaedic Surgery

## 2022-04-09 NOTE — Telephone Encounter (Signed)
Receipt mailed for payment to [facility] amount $21.31 04/09/22

## 2022-04-11 NOTE — Progress Notes (Unsigned)
Subjective:   Jaclyn Schmidt is a 72 y.o. female who presents for Medicare Annual (Subsequent) preventive examination.  Review of Systems    ***       Objective:    There were no vitals filed for this visit. There is no height or weight on file to calculate BMI.     03/17/2021    8:25 AM 03/14/2020    8:18 AM 01/12/2020    9:58 AM 03/13/2019    8:49 AM 09/13/2018    3:52 PM 05/30/2018   10:50 AM 05/24/2018   11:40 AM  Advanced Directives  Does Patient Have a Medical Advance Directive? No No No No No No No  Would patient like information on creating a medical advance directive? No - Patient declined No - Patient declined  No - Patient declined No - Patient declined No - Patient declined No - Patient declined    Current Medications (verified) Outpatient Encounter Medications as of 04/12/2022  Medication Sig   ALPRAZolam (XANAX) 0.5 MG tablet Take 1 tablet (0.5 mg total) by mouth at bedtime as needed for anxiety.   anastrozole (ARIMIDEX) 1 MG tablet Take 1 tablet by mouth daily.   BIOTIN PO Take 2 tablets by mouth daily.   calcium carbonate (OSCAL) 1500 (600 Ca) MG TABS tablet Take by mouth 2 (two) times daily with a meal.   cetirizine (ZYRTEC) 10 MG tablet Take 10 mg by mouth daily.   Cholecalciferol (VITAMIN D3 PO) Take 4,000 Int'l Units by mouth daily.   fluticasone (FLONASE) 50 MCG/ACT nasal spray PLACE 2 SPRAYS INTO BOTH NOSTRILS DAILY AS NEEDED (FOR ALLERGIES.).   gabapentin (NEURONTIN) 300 MG capsule TAKE (2) CAPSULES TWICE DAILY.   levothyroxine (SYNTHROID) 75 MCG tablet Take 1 tablet (75 mcg total) by mouth daily.   meclizine (ANTIVERT) 25 MG tablet TAKE 1 TABLET BY MOUTH 3 TIMES A DAY AS NEEDED FOR DIZZINESS   meloxicam (MOBIC) 15 MG tablet Take 1 tablet (15 mg total) by mouth daily.   pravastatin (PRAVACHOL) 40 MG tablet Take 1 tablet (40 mg total) by mouth daily.   sertraline (ZOLOFT) 100 MG tablet Take 1 tablet (100 mg total) by mouth daily.   triamcinolone ointment  (KENALOG) 0.5 % Apply 1 application topically 2 (two) times daily.   Facility-Administered Encounter Medications as of 04/12/2022  Medication   cyanocobalamin ((VITAMIN B-12)) injection 1,000 mcg    Allergies (verified) Penicillins and Doxycycline   History: Past Medical History:  Diagnosis Date   Anxiety    Breast cancer ([facility]) 05/2018   Right Breast Cancer   Cancer ([place]) 04/2018   right breast cancer   Cholelithiasis    Depression    GERD (gastroesophageal reflux disease)    History of kidney stones    Hyperlipidemia    Hypothyroidism    Irritable bowel syndrome    Personal history of radiation therapy 2020   Right Breast Cancer   Pneumonia    Thyroid disease    Vitamin D deficiency    Past Surgical History:  Procedure Laterality Date   ABDOMINAL HYSTERECTOMY  [id number]   ANTERIOR CERVICAL DECOMP/DISCECTOMY FUSION N/A 01/21/2017   Procedure: C3-4, C4-5 ANTERIOR CERVICAL DISCECTOMY AND FUSION, ALLOGRAFT, PLATE;  Surgeon: [name], MD;  Location: [facility];  Service: Orthopedics;  Laterality: N/A;   APPENDECTOMY  1996   BREAST LUMPECTOMY Right 05/30/2018   BREAST LUMPECTOMY WITH RADIOACTIVE SEED AND SENTINEL LYMPH NODE BIOPSY Right 05/30/2018   Procedure: RIGHT BREAST LUMPECTOMY WITH RADIOACTIVE  SEED AND RIGHT AXILLARY SENTINEL LYMPH NODE BIOPSY;  Surgeon: Alphonsa Overall, MD;  Location: [facility];  Service: General;  Laterality: Right;   BREAST MASS EXCISION  1997   left   CHOLECYSTECTOMY  07/15/2011   Procedure: LAPAROSCOPIC CHOLECYSTECTOMY WITH INTRAOPERATIVE CHOLANGIOGRAM;  Surgeon: Shann Medal, MD;  [facility];  Service: General;  Laterality: N/A;  Cholecystectomy with IOC   TOE FUSION     TUBAL LIGATION  1980   Family History  Problem Relation Age of Onset   Cancer Mother        stomach   Heart disease Father    Cancer Maternal Aunt        ovarian   Cancer Paternal Aunt        breast   Diabetes Brother    Hypertension Brother    Social  History   Socioeconomic History   Marital status: Significant Other    Spouse name: Not on file   Number of children: 1   Years of education: 12   Highest education level: High school graduate  Occupational History   Occupation: Retired     Fish farm manager: [employer]   Occupation: Retired    Comment: Actuary  Tobacco Use   Smoking status: Some Days    Packs/day: 0.25    Years: 36.00    Additional pack years: 0.00    Total pack years: 9.00    Types: Cigarettes    Start date: 09/30/1973    Last attempt to quit: 04/26/2018    Years since quitting: 3.9   Smokeless tobacco: Never   Tobacco comments:    a pack per month  Vaping Use   Vaping Use: Never used  Substance and Sexual Activity   Alcohol use: No    Alcohol/week: 0.0 standard drinks of alcohol   Drug use: No   Sexual activity: Not on file  Other Topics Concern   Not on file  Social History Narrative   3 grandchildren. Enjoys dancing and going out to eat.    Social Determinants of Health   Financial Resource Strain: Low Risk  (03/17/2021)   Overall Financial Resource Strain (CARDIA)    Difficulty of Paying Living Expenses: Not hard at all  Food Insecurity: No Food Insecurity (03/17/2021)   Hunger Vital Sign    Worried About Running Out of Food in the Last Year: Never true    Ran Out of Food in the Last Year: Never true  Transportation Needs: No Transportation Needs (03/17/2021)   PRAPARE - Hydrologist (Medical): No    Lack of Transportation (Non-Medical): No  Physical Activity: Sufficiently Active (03/17/2021)   Exercise Vital Sign    Days of Exercise per Week: 5 days    Minutes of Exercise per Session: 30 min  Stress: No Stress Concern Present (03/17/2021)   [place]    Feeling of Stress : Not at all  Social Connections: Moderately Integrated (03/17/2021)   Social Connection and Isolation Panel [NHANES]    Frequency of  Communication with Friends and Family: More than three times a week    Frequency of Social Gatherings with Friends and Family: More than three times a week    Attends Religious Services: More than 4 times per year    Active Member of [employer] or Organizations: Yes    Attends Archivist Meetings: More than 4 times per year    Marital Status:  Divorced    Tobacco Counseling Ready to quit: Not Answered Counseling given: Not Answered Tobacco comments: a pack per month   Clinical Intake:                 Diabetic?No          Activities of Daily Living     No data to display          Patient Care Team: Sharion Balloon, FNP as PCP - General (Family Medicine) [name], PA-C as Referring Physician (General Practice) [name], MD as Consulting Physician (Orthopedic Surgery) [name], RN as Oncology Nurse Navigator [name], RN as Oncology Nurse Navigator [name], MD as Consulting Physician (Radiation Oncology) Ala Dach, MD as Referring Physician (Hematology and Oncology) Gari Crown, MD as Referring Physician (Obstetrics and Gynecology) Lavonna Monarch, MD (Inactive) as Consulting Physician (Dermatology) Kerry Kass, MD as Referring Physician (Surgery)  Indicate any recent Medical Services you may have received from other than [facility] providers in the past year (date may be approximate).     Assessment:   This is a routine wellness examination for [place].  Hearing/Vision screen No results found.  Dietary issues and exercise activities discussed:     Goals Addressed   None    Depression Screen    03/17/2021    8:24 AM 12/18/2020    3:12 PM 09/02/2020    3:22 PM 06/20/2020    2:42 PM 03/14/2020    8:19 AM 03/06/2020    2:27 PM 01/22/2020    3:06 PM  PHQ 2/9 Scores  PHQ - 2 Score 0 0 0 0 0 0 0  PHQ- 9 Score  1 0 2       Fall Risk    03/17/2021    8:21 AM 06/20/2020    2:42 PM 03/14/2020    8:19 AM  03/06/2020    2:27 PM 01/22/2020    3:06 PM  [place] in the past year? 0 0 0 0 0  Number falls in past yr: 0      Injury with Fall? 0      Risk for fall due to : Medication side effect;Orthopedic patient      Follow up Falls prevention discussed        [place]:  Any stairs in or around the home? {YES/NO:21197} If so, are there any without handrails? {YES/NO:21197} Home free of loose throw rugs in walkways, pet beds, electrical cords, etc? {YES/NO:21197} Adequate lighting in your home to reduce risk of falls? {YES/NO:21197}  ASSISTIVE DEVICES UTILIZED TO PREVENT FALLS:  Life alert? {YES/NO:21197} Use of a cane, walker or w/c? {YES/NO:21197} Grab bars in the bathroom? {YES/NO:21197} Shower chair or bench in shower? {YES/NO:21197} Elevated toilet seat or a handicapped toilet? {YES/NO:21197}  TIMED UP AND GO:  Was the test performed? No . Telephonic visit   Cognitive Function:    02/07/2018    4:41 PM  MMSE - Mini Mental State Exam  Orientation to time 5  Orientation to Place 5  Registration 3  Attention/ Calculation 5  Recall 2  Language- name 2 objects 2  Language- repeat 1  Language- follow 3 step command 3  Language- read & follow direction 1  Write a sentence 1  Copy design 1  Total score 29        03/17/2021    8:33 AM 03/14/2020    8:22 AM  03/13/2019    8:54 AM  6CIT Screen  What Year? 0 points 0 points 0 points  What month? 0 points 0 points 0 points  What time? 0 points 0 points 0 points  Count back from 20 0 points 0 points 0 points  Months in reverse 0 points 0 points 0 points  Repeat phrase 2 points 0 points 0 points  Total Score 2 points 0 points 0 points    Immunizations Immunization History  Administered Date(s) Administered   Fluad Quad(high Dose 65+) 10/26/2019, 10/26/2019, 11/03/2020, 12/18/2021   Influenza Inj Mdck Quad Pf 10/19/2016   Influenza, High Dose Seasonal PF 11/07/2017, 11/07/2017,  10/09/2018   Influenza-Unspecified 10/30/2015   Moderna Sars-Covid-2 Vaccination 02/21/2019, 03/22/2019, 12/11/2019   Pneumococcal Conjugate-13 11/07/2017   Pneumococcal Polysaccharide-23 06/26/2019   Tdap 02/19/2020   Zoster Recombinat (Shingrix) 08/27/2020, 01/27/2021   Zoster, Live 10/29/2014    TDAP status: Up to date  Flu Vaccine status: Up to date  Pneumococcal vaccine status: Up to date  Covid-19 vaccine status: Information provided on how to obtain vaccines.   Qualifies for Shingles Vaccine? Yes   Zostavax completed Yes   Shingrix Completed?: Yes  Screening Tests Health Maintenance  Topic Date Due   COVID-19 Vaccine (4 - 2023-24 season) 09/18/2021   Medicare Annual Wellness (AWV)  03/17/2022   MAMMOGRAM  06/10/2022   DEXA SCAN  08/26/2023   DTaP/Tdap/Td (2 - Td or Tdap) 02/18/2030   COLONOSCOPY (Pts 45-71yrs Insurance coverage will need to be confirmed)  12/30/2030   Pneumonia Vaccine 63+ Years old  Completed   INFLUENZA VACCINE  Completed   Hepatitis C Screening  Completed   Zoster Vaccines- Shingrix  Completed   HPV VACCINES  Aged Out    Health Maintenance  Health Maintenance Due  Topic Date Due   COVID-19 Vaccine (4 - 2023-24 season) 09/18/2021   Medicare Annual Wellness (AWV)  03/17/2022    Colorectal cancer screening: Type of screening: Colonoscopy. Completed 01/08/21. Repeat every 10 years  Mammogram status: Completed 06/09/21. Repeat every year  Bone Density status: Completed 08/25/21. Results reflect: Bone density results: OSTEOPENIA. Repeat every 2 years.  Lung Cancer Screening: (Low Dose CT Chest recommended if Age 35-80 years, 30 pack-year currently smoking OR have quit w/in 15years.) does not qualify.   Lung Cancer Screening Referral: n/a  Additional Screening:  Hepatitis C Screening: does qualify; Completed 06/26/19  Vision Screening: Recommended annual ophthalmology exams for early detection of glaucoma and other disorders of the eye. Is  the patient up to date with their annual eye exam?  {YES/NO:21197} Who is the provider or what is the name of the office in which the patient attends annual eye exams? *** If pt is not established with a provider, would they like to be referred to a provider to establish care? {YES/NO:21197}.   Dental Screening: Recommended annual dental exams for proper oral hygiene  Community Resource Referral / Chronic Care Management: CRR required this visit?  {YES/NO:21197}  CCM required this visit?  {YES/NO:21197}     Plan:     I have personally reviewed and noted the following in the patient's chart:   Medical and social history Use of alcohol, tobacco or illicit drugs  Current medications and supplements including opioid prescriptions. {Opioid [phone]} Functional ability and status Nutritional status Physical activity Advanced directives List of other physicians Hospitalizations, surgeries, and ER visits in previous 12 months Vitals Screenings to include cognitive, depression, and falls Referrals and appointments  In addition, I  have reviewed and discussed with patient certain preventive protocols, quality metrics, and best practice recommendations. A written personalized care plan for preventive services as well as general preventive health recommendations were provided to patient.     Vanetta Mulders, [place]   [id number]   Due to this being a virtual visit, the after visit summary with patients personalized plan was offered to patient via mail or my-chart. ***Patient declined at this time./ Patient would like to access on my-chart/ per request, patient was mailed a copy of AVS./ Patient preferred to pick up at office at next visit   Nurse Notes: ***

## 2022-04-11 NOTE — Patient Instructions (Signed)
Jaclyn Schmidt , Thank you for taking time to come for your Medicare Wellness Visit. I appreciate your ongoing commitment to your health goals. Please review the following plan we discussed and let me know if I can assist you in the future.   These are the goals we discussed:  Goals      Exercise 3x per week (30 min per time)        This is a list of the screening recommended for you and due dates:  Health Maintenance  Topic Date Due   COVID-19 Vaccine (4 - 2023-24 season) 09/18/2021   Mammogram  06/10/2022   Medicare Annual Wellness Visit  04/12/2023   DEXA scan (bone density measurement)  08/26/2023   DTaP/Tdap/Td vaccine (2 - Td or Tdap) 02/18/2030   Colon Cancer Screening  12/30/2030   Pneumonia Vaccine  Completed   Flu Shot  Completed   Hepatitis C Screening: USPSTF Recommendation to screen - Ages 18-79 yo.  Completed   Zoster (Shingles) Vaccine  Completed   HPV Vaccine  Aged Out    Advanced directives: Forms are available if you choose in the future to pursue completion.  This is recommended in order to make sure that your health wishes are honored in the event that you are unable to verbalize them to the provider.    Conditions/risks identified: Aim for 30 minutes of exercise or brisk walking, 6-8 glasses of water, and 5 servings of fruits and vegetables each day.   Next appointment: Follow up in one year for your annual wellness visit    Preventive Care 65 Years and Older, Female Preventive care refers to lifestyle choices and visits with your health care provider that can promote health and wellness. What does preventive care include? A yearly physical exam. This is also called an annual well check. Dental exams once or twice a year. Routine eye exams. Ask your health care provider how often you should have your eyes checked. Personal lifestyle choices, including: Daily care of your teeth and gums. Regular physical activity. Eating a healthy diet. Avoiding tobacco and  drug use. Limiting alcohol use. Practicing safe sex. Taking low-dose aspirin every day. Taking vitamin and mineral supplements as recommended by your health care provider. What happens during an annual well check? The services and screenings done by your health care provider during your annual well check will depend on your age, overall health, lifestyle risk factors, and family history of disease. Counseling  Your health care provider may ask you questions about your: Alcohol use. Tobacco use. Drug use. Emotional well-being. Home and relationship well-being. Sexual activity. Eating habits. History of falls. Memory and ability to understand (cognition). Work and work Statistician. Reproductive health. Screening  You may have the following tests or measurements: Height, weight, and BMI. Blood pressure. Lipid and cholesterol levels. These may be checked every 5 years, or more frequently if you are over 81 years old. Skin check. Lung cancer screening. You may have this screening every year starting at age 37 if you have a 30-pack-year history of smoking and currently smoke or have quit within the past 15 years. Fecal occult blood test (FOBT) of the stool. You may have this test every year starting at age 12. Flexible sigmoidoscopy or colonoscopy. You may have a sigmoidoscopy every 5 years or a colonoscopy every 10 years starting at age 63. Hepatitis C blood test. Hepatitis B blood test. Sexually transmitted disease (STD) testing. Diabetes screening. This is done by checking your blood sugar (  glucose) after you have not eaten for a while (fasting). You may have this done every 1-3 years. Bone density scan. This is done to screen for osteoporosis. You may have this done starting at age 64. Mammogram. This may be done every 1-2 years. Talk to your health care provider about how often you should have regular mammograms. Talk with your health care provider about your test results, treatment  options, and if necessary, the need for more tests. Vaccines  Your health care provider may recommend certain vaccines, such as: Influenza vaccine. This is recommended every year. Tetanus, diphtheria, and acellular pertussis (Tdap, Td) vaccine. You may need a Td booster every 10 years. Zoster vaccine. You may need this after age 50. Pneumococcal 13-valent conjugate (PCV13) vaccine. One dose is recommended after age 11. Pneumococcal polysaccharide (PPSV23) vaccine. One dose is recommended after age 49. Talk to your health care provider about which screenings and vaccines you need and how often you need them. This information is not intended to replace advice given to you by your health care provider. Make sure you discuss any questions you have with your health care provider. Document Released: 01/31/2015 Document Revised: 09/24/2015 Document Reviewed: 11/05/2014 Elsevier Interactive Patient Education  2017 [place] Prevention in the Home Falls can cause injuries. They can happen to people of all ages. There are many things you can do to make your home safe and to help prevent falls. What can I do on the outside of my home? Regularly fix the edges of walkways and driveways and fix any cracks. Remove anything that might make you trip as you walk through a door, such as a raised step or threshold. Trim any bushes or trees on the path to your home. Use bright outdoor lighting. Clear any walking paths of anything that might make someone trip, such as rocks or tools. Regularly check to see if handrails are loose or broken. Make sure that both sides of any steps have handrails. Any raised decks and porches should have guardrails on the edges. Have any leaves, snow, or ice cleared regularly. Use sand or salt on walking paths during winter. Clean up any spills in your garage right away. This includes oil or grease spills. What can I do in the bathroom? Use night lights. Install grab  bars by the toilet and in the tub and shower. Do not use towel bars as grab bars. Use non-skid mats or decals in the tub or shower. If you need to sit down in the shower, use a plastic, non-slip stool. Keep the floor dry. Clean up any water that spills on the floor as soon as it happens. Remove soap buildup in the tub or shower regularly. Attach bath mats securely with double-sided non-slip rug tape. Do not have throw rugs and other things on the floor that can make you trip. What can I do in the bedroom? Use night lights. Make sure that you have a light by your bed that is easy to reach. Do not use any sheets or blankets that are too big for your bed. They should not hang down onto the floor. Have a firm chair that has side arms. You can use this for support while you get dressed. Do not have throw rugs and other things on the floor that can make you trip. What can I do in the kitchen? Clean up any spills right away. Avoid walking on wet floors. Keep items that you use a lot in easy-to-reach places.  If you need to reach something above you, use a strong step stool that has a grab bar. Keep electrical cords out of the way. Do not use floor polish or wax that makes floors slippery. If you must use wax, use non-skid floor wax. Do not have throw rugs and other things on the floor that can make you trip. What can I do with my stairs? Do not leave any items on the stairs. Make sure that there are handrails on both sides of the stairs and use them. Fix handrails that are broken or loose. Make sure that handrails are as long as the stairways. Check any carpeting to make sure that it is firmly attached to the stairs. Fix any carpet that is loose or worn. Avoid having throw rugs at the top or bottom of the stairs. If you do have throw rugs, attach them to the floor with carpet tape. Make sure that you have a light switch at the top of the stairs and the bottom of the stairs. If you do not have them,  ask someone to add them for you. What else can I do to help prevent falls? Wear shoes that: Do not have high heels. Have rubber bottoms. Are comfortable and fit you well. Are closed at the toe. Do not wear sandals. If you use a stepladder: Make sure that it is fully opened. Do not climb a closed stepladder. Make sure that both sides of the stepladder are locked into place. Ask someone to hold it for you, if possible. Clearly mark and make sure that you can see: Any grab bars or handrails. First and last steps. Where the edge of each step is. Use tools that help you move around (mobility aids) if they are needed. These include: Canes. Walkers. Scooters. Crutches. Turn on the lights when you go into a dark area. Replace any light bulbs as soon as they burn out. Set up your furniture so you have a clear path. Avoid moving your furniture around. If any of your floors are uneven, fix them. If there are any pets around you, be aware of where they are. Review your medicines with your doctor. Some medicines can make you feel dizzy. This can increase your chance of falling. Ask your doctor what other things that you can do to help prevent falls. This information is not intended to replace advice given to you by your health care provider. Make sure you discuss any questions you have with your health care provider. Document Released: 10/31/2008 Document Revised: 06/12/2015 Document Reviewed: 02/08/2014 Elsevier Interactive Patient Education  2017 [employer].

## 2022-04-12 ENCOUNTER — Ambulatory Visit (INDEPENDENT_AMBULATORY_CARE_PROVIDER_SITE_OTHER): Payer: Medicare HMO

## 2022-04-12 VITALS — Ht 62.0 in | Wt 144.0 lb

## 2022-04-12 DIAGNOSIS — Z0000 Encounter for general adult medical examination without abnormal findings: Secondary | ICD-10-CM | POA: Diagnosis not present

## 2022-05-11 ENCOUNTER — Ambulatory Visit (INDEPENDENT_AMBULATORY_CARE_PROVIDER_SITE_OTHER): Payer: Medicare HMO

## 2022-05-11 DIAGNOSIS — E538 Deficiency of other specified B group vitamins: Secondary | ICD-10-CM | POA: Diagnosis not present

## 2022-05-11 NOTE — Progress Notes (Signed)
Cyanocobalamin injection given to left deltoid.  Patient tolerated well. 

## 2022-05-14 ENCOUNTER — Other Ambulatory Visit: Payer: Self-pay | Admitting: Family

## 2022-05-14 DIAGNOSIS — E039 Hypothyroidism, unspecified: Secondary | ICD-10-CM

## 2022-06-11 ENCOUNTER — Ambulatory Visit (INDEPENDENT_AMBULATORY_CARE_PROVIDER_SITE_OTHER): Payer: Medicare HMO

## 2022-06-11 DIAGNOSIS — E538 Deficiency of other specified B group vitamins: Secondary | ICD-10-CM

## 2022-06-11 NOTE — Progress Notes (Signed)
Cyanocobalamin injection given to left arm.  Patient tolerated well. 

## 2022-06-16 ENCOUNTER — Encounter: Payer: Self-pay | Admitting: Nurse Practitioner

## 2022-06-16 ENCOUNTER — Ambulatory Visit (INDEPENDENT_AMBULATORY_CARE_PROVIDER_SITE_OTHER): Payer: Medicare HMO | Admitting: Nurse Practitioner

## 2022-06-16 VITALS — BP 137/87 | HR 73 | Temp 97.3°F | Ht 62.0 in | Wt 152.4 lb

## 2022-06-16 DIAGNOSIS — L237 Allergic contact dermatitis due to plants, except food: Secondary | ICD-10-CM

## 2022-06-16 MED ORDER — METHYLPREDNISOLONE ACETATE 80 MG/ML IJ SUSP: 80.0000 mg | Freq: Once | INTRAMUSCULAR | Status: DC

## 2022-06-16 NOTE — Progress Notes (Addendum)
   Acute Office Visit  Subjective:     Patient ID: Jaclyn Schmidt, female    DOB: 1950-03-27, 72 y.o.   MRN: [id number]  Chief Complaint  Patient presents with   Poison Ivy    Started Saturday. Rash is spreading. Put Clorox on skin in one spot. Been using anti itch cream. And triamcinolone cream    Poison Ivy   Jaclyn Schmidt, female is a 72 yrs old  female here today for an acute visit for poison oak exposure. She was doing yard work on Sat and was exposed to poison oak and develop a rash on bilateral arm " it was so itchy that I wash it with Clorox" advice client to use Dawn dish soap in the future. She reports "itchiness,redness" She wants to get a steroid injection. No other concerns  ROS Negative unless indicated in HPI    Objective:    BP 137/87   Pulse 73   Temp (!) 97.3 F (36.3 C) (Temporal)   Ht 5\' 2"  (1.575 m)   Wt 152 lb 6.4 oz (69.1 kg)   SpO2 98%   BMI 27.87 kg/m  BP Readings from Last 3 Encounters:  06/16/22 137/87  02/25/22 110/75  08/25/21 (!) 141/70      Physical Exam Vitals and nursing note reviewed.  Constitutional:      General: She is not in acute distress.    Appearance: Normal appearance. She is not ill-appearing.  HENT:     Head: Normocephalic and atraumatic.  Eyes:     Pupils: Pupils are equal, round, and reactive to light.  Cardiovascular:     Rate and Rhythm: Normal rate.     Pulses: Normal pulses.  Pulmonary:     Effort: Pulmonary effort is normal.     Breath sounds: Normal breath sounds.  Skin:    General: Skin is warm and dry.     Findings: Rash present. Rash is macular and purpuric.  Neurological:     General: No focal deficit present.     Mental Status: She is alert and oriented to person, place, and time. Mental status is at baseline.  Psychiatric:        Mood and Affect: Mood normal.        Thought Content: Thought content normal.        Judgment: Judgment normal.     No results found for any visits on  06/16/22.      Assessment & Plan:  Allergic dermatitis due to poison oak -     methylPREDNISolone Acetate   Assessment Contact Dermatitis due to exposure to poison oak  Plan Methylprednisolone shots administered  Return if symptoms worsen or fail to improve, for follow-up.  [address] Jaclyn Schmidt, [place]

## 2022-06-17 DIAGNOSIS — Z1231 Encounter for screening mammogram for malignant neoplasm of breast: Secondary | ICD-10-CM | POA: Diagnosis not present

## 2022-06-17 LAB — HM MAMMOGRAPHY

## 2022-06-20 DIAGNOSIS — E559 Vitamin D deficiency, unspecified: Secondary | ICD-10-CM | POA: Diagnosis not present

## 2022-06-20 DIAGNOSIS — Z6827 Body mass index (BMI) 27.0-27.9, adult: Secondary | ICD-10-CM | POA: Diagnosis not present

## 2022-06-20 DIAGNOSIS — E669 Obesity, unspecified: Secondary | ICD-10-CM | POA: Diagnosis not present

## 2022-06-20 DIAGNOSIS — R030 Elevated blood-pressure reading, without diagnosis of hypertension: Secondary | ICD-10-CM | POA: Diagnosis not present

## 2022-06-24 ENCOUNTER — Ambulatory Visit (INDEPENDENT_AMBULATORY_CARE_PROVIDER_SITE_OTHER): Payer: Medicare HMO | Admitting: Family Medicine

## 2022-06-24 ENCOUNTER — Encounter: Payer: Self-pay | Admitting: Family Medicine

## 2022-06-24 ENCOUNTER — Ambulatory Visit: Payer: Medicare HMO | Admitting: Family Medicine

## 2022-06-24 VITALS — BP 135/79 | HR 83 | Ht 62.0 in | Wt 148.0 lb

## 2022-06-24 DIAGNOSIS — L309 Dermatitis, unspecified: Secondary | ICD-10-CM

## 2022-06-24 MED ORDER — METHYLPREDNISOLONE ACETATE 80 MG/ML IJ SUSP
80.0000 mg | Freq: Once | INTRAMUSCULAR | Status: AC
Start: 2022-06-24 — End: 2022-06-24
  Administered 2022-06-24: 80 mg via INTRA_ARTICULAR

## 2022-06-24 MED ORDER — TRIAMCINOLONE ACETONIDE 0.5 % EX OINT
1.0000 | TOPICAL_OINTMENT | Freq: Two times a day (BID) | CUTANEOUS | 2 refills | Status: DC
Start: 2022-06-24 — End: 2023-01-17

## 2022-06-24 NOTE — Progress Notes (Signed)
BP 135/79   Pulse 83   Ht 5\' 2"  (1.575 m)   Wt 148 lb (67.1 kg)   SpO2 97%   BMI 27.07 kg/m    Subjective:   Patient ID: Jaclyn Schmidt, female    DOB: 1950/10/14, 72 y.o.   MRN: [id number]  HPI: Jaclyn Schmidt is a 72 y.o. female presenting on 06/24/2022 for Rash (Abdomen, upper back and LLE, red and itching)   HPI Patient was treated for possible reaction to poison ivy with steroid injection last week that seemed to improve but then it came back over the past couple days.  She has a few spots on her left upper arm and left upper chest and central region of her abdomen and in her left upper leg.  The spots are very pruritic.  Relevant past medical, surgical, family and social history reviewed and updated as indicated. Interim medical history since our last visit reviewed. Allergies and medications reviewed and updated.  Review of Systems  Constitutional:  Negative for chills and fever.  Eyes:  Negative for visual disturbance.  Respiratory:  Negative for chest tightness and shortness of breath.   Cardiovascular:  Negative for chest pain and leg swelling.  Musculoskeletal:  Negative for back pain and gait problem.  Skin:  Positive for rash. Negative for color change and wound.  Neurological:  Negative for light-headedness and headaches.  Psychiatric/Behavioral:  Negative for agitation and behavioral problems.   All other systems reviewed and are negative.   Per HPI unless specifically indicated above   Allergies as of 06/24/2022       Reactions   Penicillins Rash, Other (See Comments)   PATIENT HAS HAD A PCN REACTION WITH IMMEDIATE RASH, FACIAL/TONGUE/THROAT SWELLING, SOB, OR LIGHTHEADEDNESS WITH HYPOTENSION:  #  #  #  YES  #  #  #   Has patient had a PCN reaction causing severe rash involving mucus membranes or skin necrosis: No Has patient had a PCN reaction that required hospitalization: No Has patient had a PCN reaction occurring within the last 10 years: No If all of  the above answers are "NO", then may proceed with Cephalosporin use.   Doxycycline Rash        Medication List        Accurate as of June 24, 2022 10:19 AM. If you have any questions, ask your nurse or doctor.          STOP taking these medications    calcium carbonate 1500 (600 Ca) MG Tabs tablet Commonly known as: OSCAL Stopped by: Elige Radon Gerilyn Stargell, MD       TAKE these medications    ALPRAZolam 0.5 MG tablet Commonly known as: Xanax Take 1 tablet (0.5 mg total) by mouth at bedtime as needed for anxiety.   anastrozole 1 MG tablet Commonly known as: ARIMIDEX Take 1 tablet by mouth daily.   BIOTIN PO Take 2 tablets by mouth daily.   calcium carbonate 500 MG chewable tablet Commonly known as: TUMS - dosed in mg elemental calcium Chew 2 tablets by mouth daily.   cetirizine 10 MG tablet Commonly known as: ZYRTEC Take 10 mg by mouth daily.   fluticasone 50 MCG/ACT nasal spray Commonly known as: FLONASE PLACE 2 SPRAYS INTO BOTH NOSTRILS DAILY AS NEEDED (FOR ALLERGIES.).   gabapentin 300 MG capsule Commonly known as: NEURONTIN TAKE (2) CAPSULES TWICE DAILY.   levothyroxine 75 MCG tablet Commonly known as: SYNTHROID TAKE 1 TABLET BY MOUTH EVERY DAY  meclizine 25 MG tablet Commonly known as: ANTIVERT TAKE 1 TABLET BY MOUTH 3 TIMES A DAY AS NEEDED FOR DIZZINESS   meloxicam 15 MG tablet Commonly known as: MOBIC Take 1 tablet (15 mg total) by mouth daily.   pravastatin 40 MG tablet Commonly known as: PRAVACHOL Take 1 tablet (40 mg total) by mouth daily.   sertraline 100 MG tablet Commonly known as: ZOLOFT Take 1 tablet (100 mg total) by mouth daily.   triamcinolone ointment 0.5 % Commonly known as: KENALOG Apply 1 Application topically 2 (two) times daily.   VITAMIN D3 PO Take 4,000 Int'l Units by mouth daily.         Objective:   BP 135/79   Pulse 83   Ht 5\' 2"  (1.575 m)   Wt 148 lb (67.1 kg)   SpO2 97%   BMI 27.07 kg/m   Wt  Readings from Last 3 Encounters:  06/24/22 148 lb (67.1 kg)  06/16/22 152 lb 6.4 oz (69.1 kg)  04/12/22 144 lb (65.3 kg)    Physical Exam Vitals and nursing note reviewed.  Constitutional:      General: She is not in acute distress.    Appearance: She is well-developed. She is not diaphoretic.  Eyes:     Conjunctiva/sclera: Conjunctivae normal.  Musculoskeletal:        General: No tenderness. Normal range of motion.  Skin:    General: Skin is warm and dry.     Findings: Rash (Large pink papules scattered, few on her abdomen, few on her left upper arm, few on her left upper chest.  Few on her left upper leg) present.  Neurological:     Mental Status: She is alert and oriented to person, place, and time.     Coordination: Coordination normal.  Psychiatric:        Behavior: Behavior normal.       Assessment & Plan:   Problem List Items Addressed This Visit   None Visit Diagnoses     Dermatitis    -  Primary   Relevant Medications   triamcinolone ointment (KENALOG) 0.5 %   methylPREDNISolone acetate (DEPO-MEDROL) injection 80 mg       Likely from contact irritant such as poison ivy, will do steroid injection and steroid cream Follow up plan: Return if symptoms worsen or fail to improve.  Counseling provided for all of the vaccine components No orders of the defined types were placed in this encounter.   Arville Care, MD [facility] 06/24/2022, 10:19 AM

## 2022-07-01 ENCOUNTER — Other Ambulatory Visit: Payer: Self-pay | Admitting: Family

## 2022-07-13 ENCOUNTER — Ambulatory Visit (INDEPENDENT_AMBULATORY_CARE_PROVIDER_SITE_OTHER): Payer: Medicare HMO

## 2022-07-13 ENCOUNTER — Encounter: Payer: Self-pay | Admitting: Family

## 2022-07-13 DIAGNOSIS — E538 Deficiency of other specified B group vitamins: Secondary | ICD-10-CM | POA: Diagnosis not present

## 2022-07-13 NOTE — Progress Notes (Signed)
Cyanocobalamin injection given to left deltoid.  Patient tolerated well. 

## 2022-07-29 DIAGNOSIS — H2513 Age-related nuclear cataract, bilateral: Secondary | ICD-10-CM | POA: Diagnosis not present

## 2022-07-29 DIAGNOSIS — H524 Presbyopia: Secondary | ICD-10-CM | POA: Diagnosis not present

## 2022-07-29 DIAGNOSIS — H52223 Regular astigmatism, bilateral: Secondary | ICD-10-CM | POA: Diagnosis not present

## 2022-07-29 DIAGNOSIS — H5203 Hypermetropia, bilateral: Secondary | ICD-10-CM | POA: Diagnosis not present

## 2022-08-04 ENCOUNTER — Other Ambulatory Visit: Payer: Self-pay | Admitting: Family

## 2022-08-04 DIAGNOSIS — F339 Major depressive disorder, recurrent, unspecified: Secondary | ICD-10-CM

## 2022-08-04 DIAGNOSIS — F411 Generalized anxiety disorder: Secondary | ICD-10-CM

## 2022-08-06 DIAGNOSIS — H524 Presbyopia: Secondary | ICD-10-CM | POA: Diagnosis not present

## 2022-08-06 DIAGNOSIS — H52223 Regular astigmatism, bilateral: Secondary | ICD-10-CM | POA: Diagnosis not present

## 2022-08-11 DIAGNOSIS — Z5181 Encounter for therapeutic drug level monitoring: Secondary | ICD-10-CM | POA: Diagnosis not present

## 2022-08-11 DIAGNOSIS — Z170 Estrogen receptor positive status [ER+]: Secondary | ICD-10-CM | POA: Diagnosis not present

## 2022-08-11 DIAGNOSIS — E538 Deficiency of other specified B group vitamins: Secondary | ICD-10-CM | POA: Diagnosis not present

## 2022-08-11 DIAGNOSIS — M8588 Other specified disorders of bone density and structure, other site: Secondary | ICD-10-CM | POA: Diagnosis not present

## 2022-08-11 DIAGNOSIS — Z79811 Long term (current) use of aromatase inhibitors: Secondary | ICD-10-CM | POA: Diagnosis not present

## 2022-08-11 DIAGNOSIS — D709 Neutropenia, unspecified: Secondary | ICD-10-CM | POA: Diagnosis not present

## 2022-08-11 DIAGNOSIS — C50511 Malignant neoplasm of lower-outer quadrant of right female breast: Secondary | ICD-10-CM | POA: Diagnosis not present

## 2022-08-13 ENCOUNTER — Ambulatory Visit (INDEPENDENT_AMBULATORY_CARE_PROVIDER_SITE_OTHER): Payer: Medicare HMO

## 2022-08-13 DIAGNOSIS — E538 Deficiency of other specified B group vitamins: Secondary | ICD-10-CM

## 2022-08-13 NOTE — Progress Notes (Signed)
Cyanocobalamin injection given to left deltoid.  Patient tolerated well. 

## 2022-08-15 ENCOUNTER — Other Ambulatory Visit: Payer: Self-pay | Admitting: Family

## 2022-08-28 ENCOUNTER — Other Ambulatory Visit: Payer: Self-pay | Admitting: Family

## 2022-08-28 DIAGNOSIS — F411 Generalized anxiety disorder: Secondary | ICD-10-CM

## 2022-08-28 DIAGNOSIS — F339 Major depressive disorder, recurrent, unspecified: Secondary | ICD-10-CM

## 2022-09-06 ENCOUNTER — Other Ambulatory Visit: Payer: Self-pay | Admitting: Family

## 2022-09-06 DIAGNOSIS — F339 Major depressive disorder, recurrent, unspecified: Secondary | ICD-10-CM

## 2022-09-06 DIAGNOSIS — F411 Generalized anxiety disorder: Secondary | ICD-10-CM

## 2022-09-07 ENCOUNTER — Encounter: Payer: Self-pay | Admitting: Family Medicine

## 2022-09-07 ENCOUNTER — Ambulatory Visit (INDEPENDENT_AMBULATORY_CARE_PROVIDER_SITE_OTHER): Payer: Medicare HMO | Admitting: Family Medicine

## 2022-09-07 VITALS — BP 145/83 | HR 60 | Temp 97.0°F | Ht 62.0 in | Wt 151.8 lb

## 2022-09-07 DIAGNOSIS — M5431 Sciatica, right side: Secondary | ICD-10-CM | POA: Diagnosis not present

## 2022-09-07 MED ORDER — KETOROLAC TROMETHAMINE 60 MG/2ML IM SOLN
60.0000 mg | Freq: Once | INTRAMUSCULAR | Status: AC
Start: 2022-09-07 — End: 2022-09-07
  Administered 2022-09-07: 60 mg via INTRAMUSCULAR

## 2022-09-07 MED ORDER — PREDNISONE 20 MG PO TABS
40.0000 mg | ORAL_TABLET | Freq: Every day | ORAL | 0 refills | Status: AC
Start: 2022-09-07 — End: 2022-09-12

## 2022-09-07 NOTE — Progress Notes (Signed)
Subjective:  Patient ID: Jaclyn Schmidt, female    DOB: Jan 23, 1950, 72 y.o.   MRN: [id number]  Patient Care Team: Junie Spencer, FNP as PCP - General (Family Medicine) Remus Loffler, PA-C as Referring Physician (General Practice) Eldred Manges, MD as Consulting Physician (Orthopedic Surgery) Pershing Proud, RN as Oncology Nurse Navigator Donnelly Angelica, RN as Oncology Nurse Navigator Stanford Breed, MD as Consulting Physician (Radiation Oncology) Edwyna Shell, MD as Referring Physician (Hematology and Oncology) Ernestina Penna, MD as Referring Physician (Obstetrics and Gynecology) Shelly Coss, MD as Referring Physician (Surgery) Adam Phenix, DPM as Consulting Physician (Podiatry)   Chief Complaint:  Leg Pain   HPI: Jaclyn Schmidt is a 72 y.o. female presenting on 09/07/2022 for Leg Pain   Pt presents today with right upper leg pain/thigh pain. Denies injury. States the pain starts in her hip/groin and radiates to thigh. Burning and sharp in nature. Slightly improved today. No loss of bowel or bladder. No saddle anesthesia.   Leg Pain  Incident onset: 1-2 days ago. There was no injury mechanism. The pain is present in the right thigh and right hip. The quality of the pain is described as burning, shooting and aching. The pain is moderate. The pain has been Fluctuating since onset. Pertinent negatives include no inability to bear weight, loss of motion, loss of sensation, muscle weakness, numbness or tingling. She reports no foreign bodies present. The symptoms are aggravated by movement and weight bearing. She has tried nothing for the symptoms. The treatment provided no relief.     Relevant past medical, surgical, family, and social history reviewed and updated as indicated.  Allergies and medications reviewed and updated. Data reviewed: Chart in Epic.   Past Medical History:  Diagnosis Date   Anxiety    Breast cancer (HCC) 05/2018   Right Breast Cancer    Cancer (HCC) 04/2018   right breast cancer   Cholelithiasis    Depression    GERD (gastroesophageal reflux disease)    History of kidney stones    Hyperlipidemia    Hypothyroidism    Irritable bowel syndrome    Personal history of radiation therapy 2020   Right Breast Cancer   Pneumonia    Thyroid disease    Vitamin D deficiency     Past Surgical History:  Procedure Laterality Date   ABDOMINAL HYSTERECTOMY  [id number]   ANTERIOR CERVICAL DECOMP/DISCECTOMY FUSION N/A 01/21/2017   Procedure: C3-4, C4-5 ANTERIOR CERVICAL DISCECTOMY AND FUSION, ALLOGRAFT, PLATE;  Surgeon: Eldred Manges, MD;  Location: [facility];  Service: Orthopedics;  Laterality: N/A;   APPENDECTOMY  1996   BREAST LUMPECTOMY Right 05/30/2018   BREAST LUMPECTOMY WITH RADIOACTIVE SEED AND SENTINEL LYMPH NODE BIOPSY Right 05/30/2018   Procedure: RIGHT BREAST LUMPECTOMY WITH RADIOACTIVE SEED AND RIGHT AXILLARY SENTINEL LYMPH NODE BIOPSY;  Surgeon: Ovidio Kin, MD;  Location: [facility];  Service: General;  Laterality: Right;   BREAST MASS EXCISION  1997   left   CHOLECYSTECTOMY  07/15/2011   Procedure: LAPAROSCOPIC CHOLECYSTECTOMY WITH INTRAOPERATIVE CHOLANGIOGRAM;  Surgeon: Kandis Cocking, MD;  Location: [facility];  Service: General;  Laterality: N/A;  Cholecystectomy with IOC   TOE FUSION     TUBAL LIGATION  1980    Social History   Socioeconomic History   Marital status: Significant Other    Spouse name: Not on file   Number of children: 1   Years of education: 15  Highest education level: High school graduate  Occupational History   Occupation: Retired     Associate Professor: [employer]   Occupation: Retired    Comment: Comptroller  Tobacco Use   Smoking status: Some Days    Current packs/day: 0.00    Average packs/day: 0.3 packs/day for 44.6 years (11.1 ttl pk-yrs)    Types: Cigarettes    Start date: 09/30/1973    Last attempt to quit: 04/26/2018    Years since quitting: 4.3   Smokeless tobacco: Never    Tobacco comments:    a pack per month  Vaping Use   Vaping status: Never Used  Substance and Sexual Activity   Alcohol use: No    Alcohol/week: 0.0 standard drinks of alcohol   Drug use: No   Sexual activity: Not on file  Other Topics Concern   Not on file  Social History Narrative   3 grandchildren. Enjoys dancing and going out to eat.    Social Determinants of Health   Financial Resource Strain: Low Risk  (08/11/2022)   Received from [facility]   Overall Financial Resource Strain (CARDIA)    Difficulty of Paying Living Expenses: Not hard at all  Food Insecurity: No Food Insecurity (08/11/2022)   Received from [facility]   Hunger Vital Sign    Worried About Running Out of Food in the Last Year: Never true    Ran Out of Food in the Last Year: Never true  Transportation Needs: No Transportation Needs (08/11/2022)   Received from [facility] - Transportation    Lack of Transportation (Medical): No    Lack of Transportation (Non-Medical): No  Physical Activity: Sufficiently Active (04/12/2022)   Exercise Vital Sign    Days of Exercise per Week: 5 days    Minutes of Exercise per Session: 30 min  Stress: No Stress Concern Present (04/12/2022)   [employer] of Occupational Health - Occupational Stress Questionnaire    Feeling of Stress : Not at all  Social Connections: Moderately Integrated (04/12/2022)   Social Connection and Isolation Panel [NHANES]    Frequency of Communication with Friends and Family: More than three times a week    Frequency of Social Gatherings with Friends and Family: More than three times a week    Attends Religious Services: More than 4 times per year    Active Member of [employer] or Organizations: Yes    Attends Engineer, structural: More than 4 times per year    Marital Status: Divorced  Intimate Partner Violence: Not At Risk (04/12/2022)   Humiliation, Afraid, Rape, and Kick questionnaire    Fear of Current or Ex-Partner: No     Emotionally Abused: No    Physically Abused: No    Sexually Abused: No    Outpatient Encounter Medications as of 09/07/2022  Medication Sig   ALPRAZolam (XANAX) 0.5 MG tablet Take 1 tablet (0.5 mg total) by mouth at bedtime as needed for anxiety.   anastrozole (ARIMIDEX) 1 MG tablet Take 1 tablet by mouth daily.   BIOTIN PO Take 2 tablets by mouth daily.   calcium carbonate (TUMS - DOSED IN MG ELEMENTAL CALCIUM) 500 MG chewable tablet Chew 2 tablets by mouth daily.   cetirizine (ZYRTEC) 10 MG tablet Take 10 mg by mouth daily.   Cholecalciferol (VITAMIN D3 PO) Take 4,000 Int'l Units by mouth daily.   fluticasone (FLONASE) 50 MCG/ACT nasal spray PLACE 2 SPRAYS INTO BOTH NOSTRILS DAILY AS NEEDED (FOR ALLERGIES.).  gabapentin (NEURONTIN) 300 MG capsule TAKE (2) CAPSULES TWICE DAILY.   levothyroxine (SYNTHROID) 75 MCG tablet TAKE 1 TABLET BY MOUTH EVERY DAY   meclizine (ANTIVERT) 25 MG tablet TAKE 1 TABLET BY MOUTH 3 TIMES A DAY AS NEEDED FOR DIZZINESS   meloxicam (MOBIC) 15 MG tablet Take 1 tablet (15 mg total) by mouth daily. (NEEDS TO BE SEEN BEFORE NEXT REFILL)   pravastatin (PRAVACHOL) 40 MG tablet Take 1 tablet (40 mg total) by mouth daily.   predniSONE (DELTASONE) 20 MG tablet Take 2 tablets (40 mg total) by mouth daily with breakfast for 5 days.   sertraline (ZOLOFT) 100 MG tablet Take 1 tablet (100 mg total) by mouth daily.   triamcinolone ointment (KENALOG) 0.5 % Apply 1 Application topically 2 (two) times daily.   Facility-Administered Encounter Medications as of 09/07/2022  Medication   cyanocobalamin ((VITAMIN B-12)) injection 1,000 mcg   [COMPLETED] ketorolac (TORADOL) injection 60 mg   methylPREDNISolone acetate (DEPO-MEDROL) injection 80 mg    Allergies  Allergen Reactions   Penicillins Rash and Other (See Comments)    PATIENT HAS HAD A PCN REACTION WITH IMMEDIATE RASH, FACIAL/TONGUE/THROAT SWELLING, SOB, OR LIGHTHEADEDNESS WITH HYPOTENSION:  #  #  #  YES  #  #  #    Has patient had a PCN reaction causing severe rash involving mucus membranes or skin necrosis: No Has patient had a PCN reaction that required hospitalization: No Has patient had a PCN reaction occurring within the last 10 years: No If all of the above answers are "NO", then may proceed with Cephalosporin use.    Doxycycline Rash    Review of Systems  Constitutional:  Negative for activity change, appetite change, diaphoresis, fatigue and unexpected weight change.  HENT: Negative.    Eyes: Negative.  Negative for photophobia and visual disturbance.  Respiratory:  Negative for cough, chest tightness and shortness of breath.   Cardiovascular:  Negative for chest pain, palpitations and leg swelling.  Gastrointestinal:  Negative for abdominal distention, anal bleeding, blood in stool and rectal pain.  Endocrine: Negative.   Genitourinary:  Negative for decreased urine volume and difficulty urinating.  Musculoskeletal:  Positive for arthralgias, gait problem and myalgias. Negative for joint swelling, neck pain and neck stiffness.  Skin: Negative.   Allergic/Immunologic: Negative.   Neurological:  Negative for dizziness, tingling, tremors, seizures, syncope, speech difficulty, weakness, light-headedness and numbness.  Hematological: Negative.   Psychiatric/Behavioral:  Negative for confusion, hallucinations, sleep disturbance and suicidal ideas.   All other systems reviewed and are negative.       Objective:  BP (!) 145/83   Pulse 60   Temp (!) 97 F (36.1 C) (Temporal)   Ht 5\' 2"  (1.575 m)   Wt 151 lb 12.8 oz (68.9 kg)   SpO2 95%   BMI 27.76 kg/m    Wt Readings from Last 3 Encounters:  09/07/22 151 lb 12.8 oz (68.9 kg)  06/24/22 148 lb (67.1 kg)  06/16/22 152 lb 6.4 oz (69.1 kg)    Physical Exam Vitals and nursing note reviewed.  Constitutional:      General: She is not in acute distress.    Appearance: She is well-developed and well-groomed. She is not ill-appearing,  toxic-appearing or diaphoretic.     Comments: Uncomfortable  HENT:     Head: Normocephalic and atraumatic.     Jaw: There is normal jaw occlusion.     Right Ear: Hearing normal.     Left Ear: Hearing normal.  Nose: Nose normal.     Mouth/Throat:     Lips: Pink.     Mouth: Mucous membranes are moist.     Pharynx: Uvula midline.  Eyes:     General: Lids are normal.     Extraocular Movements: Extraocular movements intact.     Conjunctiva/sclera: Conjunctivae normal.     Pupils: Pupils are equal, round, and reactive to light.  Neck:     Trachea: Trachea and phonation normal.  Cardiovascular:     Rate and Rhythm: Normal rate and regular rhythm.     Chest Wall: PMI is not displaced.     Pulses: Normal pulses.     Heart sounds: Normal heart sounds. No murmur heard.    No friction rub. No gallop.  Pulmonary:     Effort: Pulmonary effort is normal. No respiratory distress.     Breath sounds: Normal breath sounds. No wheezing.  Abdominal:     General: Bowel sounds are normal.     Palpations: Abdomen is soft.     Tenderness: There is no abdominal tenderness.  Musculoskeletal:     Cervical back: Normal range of motion and neck supple.     Thoracic back: Normal.     Lumbar back: No swelling, deformity, lacerations, spasms or tenderness. Positive right straight leg raise test.     Right hip: Normal. No deformity, lacerations, tenderness, bony tenderness or crepitus. Normal range of motion. Normal strength.     Right upper leg: Normal.     Right lower leg: No edema.     Left lower leg: No edema.  Skin:    General: Skin is warm and dry.     Capillary Refill: Capillary refill takes less than 2 seconds.     Coloration: Skin is not cyanotic, jaundiced or pale.     Findings: No rash.  Neurological:     General: No focal deficit present.     Mental Status: She is alert and oriented to person, place, and time.     Sensory: Sensation is intact.     Motor: Motor function is intact.      Coordination: Coordination is intact.     Gait: Gait is intact.     Deep Tendon Reflexes: Reflexes are normal and symmetric.  Psychiatric:        Attention and Perception: Attention and perception normal.        Mood and Affect: Mood and affect normal.        Speech: Speech normal.        Behavior: Behavior normal. Behavior is cooperative.        Thought Content: Thought content normal.        Cognition and Memory: Cognition and memory normal.        Judgment: Judgment normal.     Results for orders placed or performed in visit on 07/13/22  HM MAMMOGRAPHY  Result Value Ref Range   HM Mammogram 0-4 Bi-Rad 0-4 Bi-Rad, Self Reported Normal       Pertinent labs & imaging results that were available during my care of the patient were reviewed by me and considered in my medical decision making.  Assessment & Plan:  Gavriela was seen today for leg pain.  Diagnoses and all orders for this visit:  Right sided sciatica Classic sciatica without red flags concerning for cauda equina syndrome. Will burst with Toradol in office today and treat with steroids over the next 5 days. Pt aware of red flags and to report  any new, worsening, or persistent symptoms.  -     predniSONE (DELTASONE) 20 MG tablet; Take 2 tablets (40 mg total) by mouth daily with breakfast for 5 days.     Continue all other maintenance medications.  Follow up plan: Return in about 2 weeks (around 09/21/2022), or if symptoms worsen or fail to improve, for sciatica .   Continue healthy lifestyle choices, including diet (rich in fruits, vegetables, and lean proteins, and low in salt and simple carbohydrates) and exercise (at least 30 minutes of moderate physical activity daily).  Educational handout given for sciatica   The above assessment and management plan was discussed with the patient. The patient verbalized understanding of and has agreed to the management plan. Patient is aware to call the clinic if they develop any  new symptoms or if symptoms persist or worsen. Patient is aware when to return to the clinic for a follow-up visit. Patient educated on when it is appropriate to go to the emergency department.   Kari Baars, FNP-C [facility] [phone]

## 2022-09-10 ENCOUNTER — Telehealth: Payer: Self-pay | Admitting: Family

## 2022-09-13 ENCOUNTER — Other Ambulatory Visit: Payer: Self-pay | Admitting: Family

## 2022-09-14 MED ORDER — PREDNISONE 10 MG (21) PO TBPK: ORAL_TABLET | ORAL | 0 refills | Status: DC

## 2022-09-14 MED ORDER — BACLOFEN 10 MG PO TABS: 10.0000 mg | ORAL_TABLET | Freq: Three times a day (TID) | ORAL | 0 refills | Status: DC

## 2022-09-14 MED ORDER — DICLOFENAC SODIUM 75 MG PO TBEC: 75.0000 mg | DELAYED_RELEASE_TABLET | Freq: Two times a day (BID) | ORAL | 0 refills | Status: DC

## 2022-09-14 NOTE — Telephone Encounter (Signed)
Can you call her and see if she is still having trouble? If it something simple I can send in for her or do a video visit if needed today.   Jannifer Rodney, FNP

## 2022-09-14 NOTE — Telephone Encounter (Signed)
Patient can not come in because she is working would like something called in was on prednisone. Wants something else called in. [facility].

## 2022-09-14 NOTE — Addendum Note (Signed)
Addended by: Jannifer Rodney A on: 09/14/2022 03:25 PM   Modules accepted: Orders

## 2022-09-14 NOTE — Telephone Encounter (Signed)
Lmtcb.

## 2022-09-14 NOTE — Telephone Encounter (Signed)
I have sent in diclofenac 75 mcg that she will take twice a day. No other NSAID's. I have also sent in prednisone and baclofen. Sedation precautions with baclofen.   Jannifer Rodney, FNP

## 2022-09-15 DIAGNOSIS — M5416 Radiculopathy, lumbar region: Secondary | ICD-10-CM | POA: Diagnosis not present

## 2022-09-15 DIAGNOSIS — M25551 Pain in right hip: Secondary | ICD-10-CM | POA: Diagnosis not present

## 2022-09-16 ENCOUNTER — Ambulatory Visit: Payer: Medicare HMO

## 2022-09-16 ENCOUNTER — Other Ambulatory Visit: Payer: Self-pay | Admitting: Family

## 2022-09-16 DIAGNOSIS — F339 Major depressive disorder, recurrent, unspecified: Secondary | ICD-10-CM

## 2022-09-16 DIAGNOSIS — F411 Generalized anxiety disorder: Secondary | ICD-10-CM

## 2022-09-16 NOTE — Telephone Encounter (Signed)
Called pt again - Pt has been notified

## 2022-09-17 ENCOUNTER — Ambulatory Visit (INDEPENDENT_AMBULATORY_CARE_PROVIDER_SITE_OTHER): Payer: Medicare HMO | Admitting: Family

## 2022-09-17 ENCOUNTER — Encounter: Payer: Self-pay | Admitting: Family

## 2022-09-17 VITALS — BP 156/83 | HR 67 | Temp 97.9°F | Ht 62.0 in | Wt 153.4 lb

## 2022-09-17 DIAGNOSIS — Z853 Personal history of malignant neoplasm of breast: Secondary | ICD-10-CM | POA: Insufficient documentation

## 2022-09-17 DIAGNOSIS — E538 Deficiency of other specified B group vitamins: Secondary | ICD-10-CM | POA: Diagnosis not present

## 2022-09-17 DIAGNOSIS — M8580 Other specified disorders of bone density and structure, unspecified site: Secondary | ICD-10-CM | POA: Diagnosis not present

## 2022-09-17 DIAGNOSIS — F339 Major depressive disorder, recurrent, unspecified: Secondary | ICD-10-CM | POA: Diagnosis not present

## 2022-09-17 DIAGNOSIS — F411 Generalized anxiety disorder: Secondary | ICD-10-CM

## 2022-09-17 DIAGNOSIS — J410 Simple chronic bronchitis: Secondary | ICD-10-CM

## 2022-09-17 DIAGNOSIS — R030 Elevated blood-pressure reading, without diagnosis of hypertension: Secondary | ICD-10-CM | POA: Diagnosis not present

## 2022-09-17 DIAGNOSIS — E785 Hyperlipidemia, unspecified: Secondary | ICD-10-CM

## 2022-09-17 DIAGNOSIS — M546 Pain in thoracic spine: Secondary | ICD-10-CM | POA: Diagnosis not present

## 2022-09-17 DIAGNOSIS — G8929 Other chronic pain: Secondary | ICD-10-CM

## 2022-09-17 DIAGNOSIS — K6289 Other specified diseases of anus and rectum: Secondary | ICD-10-CM

## 2022-09-17 DIAGNOSIS — E039 Hypothyroidism, unspecified: Secondary | ICD-10-CM

## 2022-09-17 DIAGNOSIS — Z79899 Other long term (current) drug therapy: Secondary | ICD-10-CM

## 2022-09-17 DIAGNOSIS — M545 Low back pain, unspecified: Secondary | ICD-10-CM | POA: Diagnosis not present

## 2022-09-17 MED ORDER — ALPRAZOLAM 0.5 MG PO TABS
0.5000 mg | ORAL_TABLET | Freq: Every evening | ORAL | 1 refills | Status: DC | PRN
Start: 2022-09-17 — End: 2023-01-31

## 2022-09-17 NOTE — Progress Notes (Signed)
Subjective:    Patient ID: Jaclyn Schmidt, female    DOB: 05-11-1950, 72 y.o.   MRN: [id number]  Chief Complaint  Patient presents with   Medical Management of Chronic Issues    PT presents to the office today for chronic follow up. She is followed by Oncologists every 6 months for Right Breast cancer. She completed radiation 2020. States she is doing well at this time. She has anal sphincter incompetence and taking Metamucil as needed. She does pelvic floor exercises daily.    She also has Vit B 12 deficiency and gets monthly injections.    She has COPD and states her breathing is stable. She continues to smoke 1/2 pack.    She has osteopenia and had dexa scan 08/25/21. Takes Vit D and calcium daily.  Thyroid Problem Presents for follow-up visit. Symptoms include anxiety, diarrhea and dry skin. Patient reports no fatigue or hoarse voice. The symptoms have been stable. Her past medical history is significant for hyperlipidemia.  Back Pain This is a chronic problem. The current episode started more than 1 year ago. The problem occurs intermittently. The problem has been waxing and waning since onset. The pain is present in the lumbar spine. The quality of the pain is described as aching. The pain is at a severity of 2/10. The pain is mild. Associated symptoms include leg pain. She has tried muscle relaxant and bed rest for the symptoms. The treatment provided mild relief.  Hyperlipidemia This is a chronic problem. The current episode started more than 1 year ago. Recent lipid tests were reviewed and are normal. Exacerbating diseases include obesity. Associated symptoms include leg pain. Current antihyperlipidemic treatment includes statins. The current treatment provides moderate improvement of lipids. Risk factors for coronary artery disease include dyslipidemia, hypertension, a sedentary lifestyle and post-menopausal.  Anxiety Presents for follow-up visit. Symptoms include excessive worry,  irritability, nervous/anxious behavior and restlessness. Symptoms occur occasionally. The severity of symptoms is mild.    Depression        This is a chronic problem.  The current episode started more than 1 year ago.   The problem occurs intermittently.  Associated symptoms include restlessness.  Associated symptoms include no fatigue, no helplessness, no hopelessness and not sad.  Past medical history includes thyroid problem and anxiety.   Nicotine Dependence Presents for follow-up visit. Symptoms include irritability. Symptoms are negative for fatigue. Her urge triggers include company of smokers. The symptoms have been stable. She smokes < 1/2 a pack of cigarettes per day.      Review of Systems  Constitutional:  Positive for irritability. Negative for fatigue.  HENT:  Negative for hoarse voice.   Gastrointestinal:  Positive for diarrhea.  Musculoskeletal:  Positive for back pain.  Psychiatric/Behavioral:  Positive for depression. The patient is nervous/anxious.   All other systems reviewed and are negative.      Objective:   Physical Exam Vitals reviewed.  Constitutional:      General: She is not in acute distress.    Appearance: She is well-developed.  HENT:     Head: Normocephalic and atraumatic.     Right Ear: Tympanic membrane normal.     Left Ear: Tympanic membrane normal.  Eyes:     Pupils: Pupils are equal, round, and reactive to light.  Neck:     Thyroid: No thyromegaly.  Cardiovascular:     Rate and Rhythm: Normal rate and regular rhythm.     Heart sounds: Normal heart sounds. No  murmur heard. Pulmonary:     Effort: Pulmonary effort is normal. No respiratory distress.     Breath sounds: Normal breath sounds. No wheezing.  Abdominal:     General: Bowel sounds are normal. There is no distension.     Palpations: Abdomen is soft.     Tenderness: There is no abdominal tenderness.  Musculoskeletal:        General: Tenderness present.     Cervical back: Normal  range of motion and neck supple.     Comments: Mild pain in lumbar with flexion and extension  Skin:    General: Skin is warm and dry.  Neurological:     Mental Status: She is alert and oriented to person, place, and time.     Cranial Nerves: No cranial nerve deficit.     Deep Tendon Reflexes: Reflexes are normal and symmetric.  Psychiatric:        Behavior: Behavior normal.        Thought Content: Thought content normal.        Judgment: Judgment normal.         BP (!) 169/77   Pulse 67   Temp 97.9 F (36.6 C) (Temporal)   Ht 5\' 2"  (1.575 m)   Wt 153 lb 6.4 oz (69.6 kg)   SpO2 98%   BMI 28.06 kg/m   Assessment & Plan:   Jaclyn Schmidt comes in today with chief complaint of Medical Management of Chronic Issues   Diagnosis and orders addressed:  1. GAD (generalized anxiety disorder) - ALPRAZolam (XANAX) 0.5 MG tablet; Take 1 tablet (0.5 mg total) by mouth at bedtime as needed for anxiety.  Dispense: 20 tablet; Refill: 1 - ToxASSURE Select 13 (MW), Urine - CMP14+EGFR  2. Controlled substance agreement signed - ALPRAZolam (XANAX) 0.5 MG tablet; Take 1 tablet (0.5 mg total) by mouth at bedtime as needed for anxiety.  Dispense: 20 tablet; Refill: 1 - ToxASSURE Select 13 (MW), Urine - CMP14+EGFR  3. Anal sphincter incompetence - CMP14+EGFR  4. Chronic midline thoracic back pain - CMP14+EGFR  5. Depression, recurrent (HCC) - CMP14+EGFR  6. Hyperlipidemia, unspecified hyperlipidemia type - CMP14+EGFR  7. Hypothyroidism, unspecified type - CMP14+EGFR - TSH  8. Lumbar pain - CMP14+EGFR  9. Osteopenia, unspecified location - CMP14+EGFR  10. Simple chronic bronchitis (HCC) - CMP14+EGFR  11. Vitamin B 12 deficiency - CMP14+EGFR  12. Elevated blood pressure reading  - Pt will monitor at home, states BP at home is 114/67  Labs pending Patient reviewed in [place] controlled database, no flags noted. Contract and drug screen up dated today. Continue current  medications  Health Maintenance reviewed Diet and exercise encouraged  Follow up plan: 3 months    Jannifer Rodney, FNP

## 2022-09-17 NOTE — Patient Instructions (Signed)
Hypertension, Adult High blood pressure (hypertension) is when the force of blood pumping through the arteries is too strong. The arteries are the blood vessels that carry blood from the heart throughout the body. Hypertension forces the heart to work harder to pump blood and may cause arteries to become narrow or stiff. Untreated or uncontrolled hypertension can lead to a heart attack, heart failure, a stroke, kidney disease, and other problems. A blood pressure reading consists of a higher number over a lower number. Ideally, your blood pressure should be below 120/80. The first ("top") number is called the systolic pressure. It is a measure of the pressure in your arteries as your heart beats. The second ("bottom") number is called the diastolic pressure. It is a measure of the pressure in your arteries as the heart relaxes. What are the causes? The exact cause of this condition is not known. There are some conditions that result in high blood pressure. What increases the risk? Certain factors may make you more likely to develop high blood pressure. Some of these risk factors are under your control, including: Smoking. Not getting enough exercise or physical activity. Being overweight. Having too much fat, sugar, calories, or salt (sodium) in your diet. Drinking too much alcohol. Other risk factors include: Having a personal history of heart disease, diabetes, high cholesterol, or kidney disease. Stress. Having a family history of high blood pressure and high cholesterol. Having obstructive sleep apnea. Age. The risk increases with age. What are the signs or symptoms? High blood pressure may not cause symptoms. Very high blood pressure (hypertensive crisis) may cause: Headache. Fast or irregular heartbeats (palpitations). Shortness of breath. Nosebleed. Nausea and vomiting. Vision changes. Severe chest pain, dizziness, and seizures. How is this diagnosed? This condition is diagnosed by  measuring your blood pressure while you are seated, with your arm resting on a flat surface, your legs uncrossed, and your feet flat on the floor. The cuff of the blood pressure monitor will be placed directly against the skin of your upper arm at the level of your heart. Blood pressure should be measured at least twice using the same arm. Certain conditions can cause a difference in blood pressure between your right and left arms. If you have a high blood pressure reading during one visit or you have normal blood pressure with other risk factors, you may be asked to: Return on a different day to have your blood pressure checked again. Monitor your blood pressure at home for 1 week or longer. If you are diagnosed with hypertension, you may have other blood or imaging tests to help your health care provider understand your overall risk for other conditions. How is this treated? This condition is treated by making healthy lifestyle changes, such as eating healthy foods, exercising more, and reducing your alcohol intake. You may be referred for counseling on a healthy diet and physical activity. Your health care provider may prescribe medicine if lifestyle changes are not enough to get your blood pressure under control and if: Your systolic blood pressure is above 130. Your diastolic blood pressure is above 80. Your personal target blood pressure may vary depending on your medical conditions, your age, and other factors. Follow these instructions at home: Eating and drinking  Eat a diet that is high in fiber and potassium, and low in sodium, added sugar, and fat. An example of this eating plan is called the DASH diet. DASH stands for Dietary Approaches to Stop Hypertension. To eat this way: Eat   plenty of fresh fruits and vegetables. Try to fill one half of your plate at each meal with fruits and vegetables. Eat whole grains, such as whole-wheat pasta, brown rice, or whole-grain bread. Fill about one  fourth of your plate with whole grains. Eat or drink low-fat dairy products, such as skim milk or low-fat yogurt. Avoid fatty cuts of meat, processed or cured meats, and poultry with skin. Fill about one fourth of your plate with lean proteins, such as fish, chicken without skin, beans, eggs, or tofu. Avoid pre-made and processed foods. These tend to be higher in sodium, added sugar, and fat. Reduce your daily sodium intake. Many people with hypertension should eat less than 1,500 mg of sodium a day. Do not drink alcohol if: Your health care provider tells you not to drink. You are pregnant, may be pregnant, or are planning to become pregnant. If you drink alcohol: Limit how much you have to: 0-1 drink a day for women. 0-2 drinks a day for men. Know how much alcohol is in your drink. In the [place] one drink equals one 12 oz bottle of beer (355 mL), one 5 oz glass of wine (148 mL), or one 1 oz glass of hard liquor (44 mL). Lifestyle  Work with your health care provider to maintain a healthy body weight or to lose weight. Ask what an ideal weight is for you. Get at least 30 minutes of exercise that causes your heart to beat faster (aerobic exercise) most days of the week. Activities may include walking, swimming, or biking. Include exercise to strengthen your muscles (resistance exercise), such as Pilates or lifting weights, as part of your weekly exercise routine. Try to do these types of exercises for 30 minutes at least 3 days a week. Do not use any products that contain nicotine or tobacco. These products include cigarettes, chewing tobacco, and vaping devices, such as e-cigarettes. If you need help quitting, ask your health care provider. Monitor your blood pressure at home as told by your health care provider. Keep all follow-up visits. This is important. Medicines Take over-the-counter and prescription medicines only as told by your health care provider. Follow directions carefully. Blood  pressure medicines must be taken as prescribed. Do not skip doses of blood pressure medicine. Doing this puts you at risk for problems and can make the medicine less effective. Ask your health care provider about side effects or reactions to medicines that you should watch for. Contact a health care provider if you: Think you are having a reaction to a medicine you are taking. Have headaches that keep coming back (recurring). Feel dizzy. Have swelling in your ankles. Have trouble with your vision. Get help right away if you: Develop a severe headache or confusion. Have unusual weakness or numbness. Feel faint. Have severe pain in your chest or abdomen. Vomit repeatedly. Have trouble breathing. These symptoms may be an emergency. Get help right away. Call 911. Do not wait to see if the symptoms will go away. Do not drive yourself to the hospital. Summary Hypertension is when the force of blood pumping through your arteries is too strong. If this condition is not controlled, it may put you at risk for serious complications. Your personal target blood pressure may vary depending on your medical conditions, your age, and other factors. For most people, a normal blood pressure is less than 120/80. Hypertension is treated with lifestyle changes, medicines, or a combination of both. Lifestyle changes include losing weight, eating a healthy,   low-sodium diet, exercising more, and limiting alcohol. This information is not intended to replace advice given to you by your health care provider. Make sure you discuss any questions you have with your health care provider. Document Revised: 11/11/2020 Document Reviewed: 11/11/2020 Elsevier Patient Education  2024 Elsevier Inc.  

## 2022-09-18 LAB — CMP14+EGFR
ALT: 13 IU/L (ref 0–32)
AST: 15 IU/L (ref 0–40)
Albumin: 4.5 g/dL (ref 3.8–4.8)
Alkaline Phosphatase: 83 IU/L (ref 44–121)
BUN/Creatinine Ratio: 14 (ref 12–28)
BUN: 12 mg/dL (ref 8–27)
Bilirubin Total: 0.3 mg/dL (ref 0.0–1.2)
CO2: 23 mmol/L (ref 20–29)
Calcium: 10 mg/dL (ref 8.7–10.3)
Chloride: 105 mmol/L (ref 96–106)
Creatinine, Ser: 0.86 mg/dL (ref 0.57–1.00)
Globulin, Total: 2.4 g/dL (ref 1.5–4.5)
Glucose: 98 mg/dL (ref 70–99)
Potassium: 4.4 mmol/L (ref 3.5–5.2)
Sodium: 141 mmol/L (ref 134–144)
Total Protein: 6.9 g/dL (ref 6.0–8.5)
eGFR: 72 mL/min/{1.73_m2} (ref >59)

## 2022-09-18 LAB — TSH: TSH: 4.75 u[IU]/mL — ABNORMAL HIGH (ref 0.450–4.500)

## 2022-09-21 ENCOUNTER — Other Ambulatory Visit: Payer: Self-pay | Admitting: Family

## 2022-09-21 MED ORDER — LEVOTHYROXINE SODIUM 88 MCG PO TABS: 88.0000 ug | ORAL_TABLET | Freq: Every day | ORAL | 1 refills | Status: DC

## 2022-09-23 DIAGNOSIS — M4696 Unspecified inflammatory spondylopathy, lumbar region: Secondary | ICD-10-CM | POA: Diagnosis not present

## 2022-09-24 ENCOUNTER — Telehealth: Payer: Self-pay | Admitting: Family

## 2022-09-24 DIAGNOSIS — E039 Hypothyroidism, unspecified: Secondary | ICD-10-CM

## 2022-09-24 LAB — TOXASSURE SELECT 13 (MW), URINE

## 2022-09-24 NOTE — Telephone Encounter (Signed)
Orders placed.

## 2022-10-07 DIAGNOSIS — M4696 Unspecified inflammatory spondylopathy, lumbar region: Secondary | ICD-10-CM | POA: Diagnosis not present

## 2022-10-15 ENCOUNTER — Ambulatory Visit: Payer: Medicare HMO | Admitting: Family Medicine

## 2022-10-15 ENCOUNTER — Other Ambulatory Visit: Payer: Self-pay | Admitting: Family

## 2022-10-15 NOTE — Progress Notes (Signed)
PATIENT REPORT BP FROM HOME 134/82

## 2022-10-17 ENCOUNTER — Other Ambulatory Visit: Payer: Self-pay | Admitting: Family

## 2022-10-17 DIAGNOSIS — F339 Major depressive disorder, recurrent, unspecified: Secondary | ICD-10-CM

## 2022-10-17 DIAGNOSIS — F411 Generalized anxiety disorder: Secondary | ICD-10-CM

## 2022-10-21 ENCOUNTER — Ambulatory Visit: Payer: Medicare HMO

## 2022-10-21 DIAGNOSIS — E538 Deficiency of other specified B group vitamins: Secondary | ICD-10-CM

## 2022-10-21 DIAGNOSIS — M4696 Unspecified inflammatory spondylopathy, lumbar region: Secondary | ICD-10-CM | POA: Diagnosis not present

## 2022-10-21 NOTE — Progress Notes (Signed)
B12 injection left upper deltoid - patient states that due to lymph nodes on right side she can not have injections in the right arm - patient tolerated well

## 2022-10-28 DIAGNOSIS — M25511 Pain in right shoulder: Secondary | ICD-10-CM | POA: Diagnosis not present

## 2022-11-12 ENCOUNTER — Other Ambulatory Visit: Payer: Self-pay | Admitting: Family

## 2022-11-12 DIAGNOSIS — E785 Hyperlipidemia, unspecified: Secondary | ICD-10-CM

## 2022-11-18 ENCOUNTER — Ambulatory Visit: Payer: Medicare HMO

## 2022-11-23 ENCOUNTER — Other Ambulatory Visit: Payer: Self-pay | Admitting: Family Medicine

## 2022-11-23 ENCOUNTER — Ambulatory Visit (INDEPENDENT_AMBULATORY_CARE_PROVIDER_SITE_OTHER): Payer: Medicare HMO

## 2022-11-23 ENCOUNTER — Other Ambulatory Visit: Payer: Medicare HMO

## 2022-11-23 DIAGNOSIS — E538 Deficiency of other specified B group vitamins: Secondary | ICD-10-CM

## 2022-11-23 DIAGNOSIS — E039 Hypothyroidism, unspecified: Secondary | ICD-10-CM | POA: Diagnosis not present

## 2022-11-23 DIAGNOSIS — R252 Cramp and spasm: Secondary | ICD-10-CM | POA: Diagnosis not present

## 2022-11-23 LAB — BMP8+EGFR
BUN/Creatinine Ratio: 20 (ref 12–28)
BUN: 18 mg/dL (ref 8–27)
CO2: 24 mmol/L (ref 20–29)
Calcium: 9.7 mg/dL (ref 8.7–10.3)
Chloride: 102 mmol/L (ref 96–106)
Creatinine, Ser: 0.88 mg/dL (ref 0.57–1.00)
Glucose: 98 mg/dL (ref 70–99)
Potassium: 4.6 mmol/L (ref 3.5–5.2)
Sodium: 142 mmol/L (ref 134–144)
eGFR: 70 mL/min/{1.73_m2} (ref >59)

## 2022-11-23 LAB — TSH: TSH: 2.32 u[IU]/mL (ref 0.450–4.500)

## 2022-11-30 ENCOUNTER — Other Ambulatory Visit: Payer: Self-pay | Admitting: Family

## 2022-11-30 DIAGNOSIS — F411 Generalized anxiety disorder: Secondary | ICD-10-CM

## 2022-11-30 DIAGNOSIS — F339 Major depressive disorder, recurrent, unspecified: Secondary | ICD-10-CM

## 2022-12-18 ENCOUNTER — Emergency Department (HOSPITAL_BASED_OUTPATIENT_CLINIC_OR_DEPARTMENT_OTHER)
Admission: EM | Admit: 2022-12-18 | Discharge: 2022-12-18 | Disposition: A | Discharge status: Home / Self Care | Payer: Medicare HMO | Attending: Emergency Medicine | Admitting: Emergency Medicine

## 2022-12-18 ENCOUNTER — Emergency Department (HOSPITAL_BASED_OUTPATIENT_CLINIC_OR_DEPARTMENT_OTHER): Payer: Medicare HMO

## 2022-12-18 ENCOUNTER — Other Ambulatory Visit: Payer: Self-pay

## 2022-12-18 DIAGNOSIS — E039 Hypothyroidism, unspecified: Secondary | ICD-10-CM | POA: Diagnosis not present

## 2022-12-18 DIAGNOSIS — R109 Unspecified abdominal pain: Secondary | ICD-10-CM | POA: Insufficient documentation

## 2022-12-18 DIAGNOSIS — S3992XA Unspecified injury of lower back, initial encounter: Secondary | ICD-10-CM | POA: Diagnosis not present

## 2022-12-18 DIAGNOSIS — S299XXA Unspecified injury of thorax, initial encounter: Secondary | ICD-10-CM | POA: Insufficient documentation

## 2022-12-18 DIAGNOSIS — M47816 Spondylosis without myelopathy or radiculopathy, lumbar region: Secondary | ICD-10-CM | POA: Diagnosis not present

## 2022-12-18 DIAGNOSIS — Z79899 Other long term (current) drug therapy: Secondary | ICD-10-CM | POA: Diagnosis not present

## 2022-12-18 DIAGNOSIS — S22050A Wedge compression fracture of T5-T6 vertebra, initial encounter for closed fracture: Secondary | ICD-10-CM | POA: Diagnosis not present

## 2022-12-18 DIAGNOSIS — Y9301 Activity, walking, marching and hiking: Secondary | ICD-10-CM | POA: Insufficient documentation

## 2022-12-18 DIAGNOSIS — W19XXXA Unspecified fall, initial encounter: Secondary | ICD-10-CM | POA: Insufficient documentation

## 2022-12-18 DIAGNOSIS — S3991XA Unspecified injury of abdomen, initial encounter: Secondary | ICD-10-CM | POA: Diagnosis not present

## 2022-12-18 DIAGNOSIS — M5126 Other intervertebral disc displacement, lumbar region: Secondary | ICD-10-CM | POA: Diagnosis not present

## 2022-12-18 DIAGNOSIS — S29002A Unspecified injury of muscle and tendon of back wall of thorax, initial encounter: Secondary | ICD-10-CM | POA: Diagnosis present

## 2022-12-18 DIAGNOSIS — S3993XA Unspecified injury of pelvis, initial encounter: Secondary | ICD-10-CM | POA: Diagnosis not present

## 2022-12-18 DIAGNOSIS — I700 Atherosclerosis of aorta: Secondary | ICD-10-CM | POA: Diagnosis not present

## 2022-12-18 LAB — BASIC METABOLIC PANEL
Anion gap: 9 (ref 5–15)
BUN: 16 mg/dL (ref 8–23)
CO2: 26 mmol/L (ref 22–32)
Calcium: 9.5 mg/dL (ref 8.9–10.3)
Chloride: 109 mmol/L (ref 98–111)
Creatinine, Ser: 0.89 mg/dL (ref 0.44–1.00)
GFR, Estimated: >60 mL/min (ref >60)
Glucose, Bld: 121 mg/dL — ABNORMAL HIGH (ref 70–99)
Potassium: 3.5 mmol/L (ref 3.5–5.1)
Sodium: 144 mmol/L (ref 135–145)

## 2022-12-18 LAB — CBC WITH DIFFERENTIAL/PLATELET
Abs Immature Granulocytes: 0.01 10*3/uL (ref 0.00–0.07)
Basophils Absolute: 0 10*3/uL (ref 0.0–0.1)
Basophils Relative: 1 %
Eosinophils Absolute: 0.1 10*3/uL (ref 0.0–0.5)
Eosinophils Relative: 2 %
HCT: 38.5 % (ref 36.0–46.0)
Hemoglobin: 12.6 g/dL (ref 12.0–15.0)
Immature Granulocytes: 0 %
Lymphocytes Relative: 40 %
Lymphs Abs: 1.7 10*3/uL (ref 0.7–4.0)
MCH: 29.4 pg (ref 26.0–34.0)
MCHC: 32.7 g/dL (ref 30.0–36.0)
MCV: 90 fL (ref 80.0–100.0)
Monocytes Absolute: 0.5 10*3/uL (ref 0.1–1.0)
Monocytes Relative: 11 %
Neutro Abs: 2 10*3/uL (ref 1.7–7.7)
Neutrophils Relative %: 46 %
Platelets: 242 10*3/uL (ref 150–400)
RBC: 4.28 MIL/uL (ref 3.87–5.11)
RDW: 13.3 % (ref 11.5–15.5)
WBC: 4.3 10*3/uL (ref 4.0–10.5)
nRBC: 0 % (ref 0.0–0.2)

## 2022-12-18 MED ORDER — IOHEXOL 300 MG/ML  SOLN
100.0000 mL | Freq: Once | INTRAMUSCULAR | Status: AC | PRN
  Administered 2022-12-18: 100 mL via INTRAVENOUS

## 2022-12-18 MED ORDER — OXYCODONE HCL 5 MG PO TABS: 5.0000 mg | ORAL_TABLET | Freq: Four times a day (QID) | ORAL | 0 refills | Status: DC | PRN

## 2022-12-18 MED ORDER — OXYCODONE HCL 5 MG PO TABS
5.0000 mg | ORAL_TABLET | Freq: Once | ORAL | Status: AC
  Administered 2022-12-18: 5 mg via ORAL
  Filled 2022-12-18: qty 1

## 2022-12-18 NOTE — ED Notes (Signed)
ED Provider at bedside. 

## 2022-12-18 NOTE — Discharge Instructions (Signed)
You have a compression fracture of your T6 vertebra.  You should call the neurosurgery office to schedule follow-up.  Should keep the brace on to help with comfort.  You can take Tylenol, Motrin and the prescribed oxycodone for pain.  Do not take oxycodone while driving as it may make you drowsy and increased risk for falls.  Do not mix with other narcotics.  If you have severe pain, weakness, numbness or difficulty going the bathroom you should return to the ED.

## 2022-12-18 NOTE — Progress Notes (Signed)
Orthopedic Tech Progress Note Patient Details:  Jaclyn Schmidt 04-21-1950 [id number]  Patient ID: [name], female   DOB: 01-11-1951, 72 y.o.   MRN: [id number] I called [employer] order for tlso in. Trinna Post 12/18/2022, 8:57 PM

## 2022-12-18 NOTE — ED Notes (Signed)
Ortho tech called for TLSO brace

## 2022-12-18 NOTE — ED Triage Notes (Addendum)
Pt arrived POV with family. Yesterday morning pt slipped on icy/slippery back porch. Pt reports falling flat on back. No visible bruising or injures. Having back pain and right shoulder pain. Denies LOC, did not head. No blood thinners.

## 2022-12-18 NOTE — ED Provider Notes (Signed)
[facility] EMER[facility]ENCY DEPARTMENT AT [facility] Provider Note   CSN: [id number] Arrival date & time: 12/18/22  1610     History {Add pertinent medical, surgical, social history, OB history to HPI:1} Chief Complaint  Patient presents with   Marisabel Mitrovic is a 72 y.o. female.   Fall  72 year old female history of hyperlipidemia, hypothyroidism, depression presenting for fall.  History she was walking out on her porch when it was icy and she slipped.  She fell on her back.  She did not hit her head or lose conscious.  She has no headache or neck pain or vomiting.  She is not anticoagulated.  She has pain all throughout her back as well as some pain in her abdomen.  No chest pain or pleuritic pain.  She has tried home baclofen and lidocaine patches without significant improvement.  She has had no radiation of pain down her legs, saddle seizure, weakness or numbness.  No difficulty with going the bathroom.     Home Medications Prior to Admission medications   Medication Sig Start Date End Date Taking? Authorizing Provider  ALPRAZolam Prudy Feeler) 0.5 MG tablet Take 1 tablet (0.5 mg total) by mouth at bedtime as needed for anxiety. 09/17/22   Jannifer Rodney A, FNP  anastrozole (ARIMIDEX) 1 MG tablet Take 1 tablet by mouth daily. 02/18/20   [provider]  baclofen (LIORESAL) 10 MG tablet Take 1 tablet (10 mg total) by mouth 3 (three) times daily. 09/14/22   Jannifer Rodney A, FNP  BIOTIN PO Take 2 tablets by mouth daily.    [provider]  calcium carbonate (TUMS - DOSED IN MG ELEMENTAL CALCIUM) 500 MG chewable tablet Chew 2 tablets by mouth daily.    [provider]  cetirizine (ZYRTEC) 10 MG tablet Take 10 mg by mouth daily.    [provider]  Cholecalciferol (VITAMIN D3 PO) Take 4,000 Int'l Units by mouth daily.    [provider]  fluticasone (FLONASE) 50 MCG/ACT nasal spray PLACE 2 SPRAYS INTO BOTH NOSTRILS DAILY AS NEEDED (FOR  ALLERGIES.). 07/01/22   Jannifer Rodney A, FNP  gabapentin (NEURONTIN) 300 MG capsule TAKE (2) CAPSULES TWICE DAILY. 02/25/22   Junie Spencer, FNP  levothyroxine (SYNTHROID) 88 MCG tablet Take 1 tablet (88 mcg total) by mouth daily. 09/21/22 09/21/23  Junie Spencer, FNP  meclizine (ANTIVERT) 25 MG tablet TAKE 1 TABLET BY MOUTH 3 TIMES A DAY AS NEEDED FOR DIZZINESS 02/25/22   Jannifer Rodney A, FNP  meloxicam (MOBIC) 15 MG tablet Take 1 tablet (15 mg total) by mouth daily. 09/13/22   Jannifer Rodney A, FNP  pravastatin (PRAVACHOL) 40 MG tablet TAKE 1 TABLET BY MOUTH EVERY DAY 11/12/22   Jannifer Rodney A, FNP  predniSONE (STERAPRED UNI-PAK 21 TAB) 10 MG (21) TBPK tablet Use as directed 09/14/22   Jannifer Rodney A, FNP  sertraline (ZOLOFT) 100 MG tablet TAKE 1 TABLET BY MOUTH EVERY DAY 11/30/22   Jannifer Rodney A, FNP  triamcinolone ointment (KENALOG) 0.5 % Apply 1 Application topically 2 (two) times daily. 06/24/22   Dettinger, Elige Radon, MD      Allergies    Penicillins and Doxycycline    Review of Systems   Review of Systems Review of systems completed and notable as per HPI.  ROS otherwise negative.   Physical Exam Updated Vital Signs BP 136/69   Pulse 76   Temp 97.6 F (36.4 C) (Oral)   Resp 15  SpO2 95%  Physical Exam Vitals and nursing note reviewed.  Constitutional:      General: She is not in acute distress.    Appearance: She is well-developed.  HENT:     Head: Normocephalic and atraumatic.     Nose: Nose normal.     Mouth/Throat:     Mouth: Mucous membranes are moist.     Pharynx: Oropharynx is clear.  Eyes:     Extraocular Movements: Extraocular movements intact.     Conjunctiva/sclera: Conjunctivae normal.     Pupils: Pupils are equal, round, and reactive to light.  Cardiovascular:     Rate and Rhythm: Normal rate and regular rhythm.     Heart sounds: No murmur heard. Pulmonary:     Effort: Pulmonary effort is normal. No respiratory distress.     Breath sounds: Normal  breath sounds.  Abdominal:     Palpations: Abdomen is soft.     Tenderness: There is abdominal tenderness. There is no guarding or rebound.  Musculoskeletal:        General: No swelling.     Cervical back: Neck supple. No rigidity or tenderness.     Right lower leg: No edema.     Left lower leg: No edema.     Comments: She is some tenderness along the lower lumbar spine midline as well as paraspinal tenderness throughout thoracic and lumbar spine.No cervical spine tenderness.  No chest wall tenderness.  No skin changes or bruising.  No pain in her extremities.  Skin:    General: Skin is warm and dry.     Capillary Refill: Capillary refill takes less than 2 seconds.  Neurological:     Mental Status: She is alert.  Psychiatric:        Mood and Affect: Mood normal.     ED Results / Procedures / Treatments   Labs (all labs ordered are listed, but only abnormal results are displayed) Labs Reviewed - No data to display  EKG None  Radiology No results found.  Procedures Procedures  {Document cardiac monitor, telemetry assessment procedure when appropriate:1}  Medications Ordered in ED Medications - No data to display  ED Course/ Medical Decision Making/ A&P   {   Click here for ABCD2, HEART and other calculatorsREFRESH Note before signing :1}                              Medical Decision Making  Medical Decision Making:   JOCELYNN PANO is a 71 y.o. female who presented to the ED today with fall and back pain.  Vital signs reviewed.  She is well-appearing.  No head or neck trauma and not anticoagulated.  She is tenderness all throughout her back as well as her abdomen.  Given mechanism and areas of pain and tenderness obtain CT scan for evaluation.   {crccomplexity:27900} Reviewed and confirmed nursing documentation for past medical history, family history, social history.  Reassessment and Plan:   ***    Patient's presentation is most consistent with {EM COPA:27473}      {Document critical care time when appropriate:1} {Document review of labs and clinical decision tools ie heart score, Chads2Vasc2 etc:1}  {Document your independent review of radiology images, and any outside records:1} {Document your discussion with family members, caretakers, and with consultants:1} {Document social determinants of health affecting pt's care:1} {Document your decision making why or why not admission, treatments were needed:1} Final Clinical Impression(s) / ED  Diagnoses Final diagnoses:  None    Rx / DC Orders ED Discharge Orders     None

## 2022-12-21 DIAGNOSIS — Z6828 Body mass index (BMI) 28.0-28.9, adult: Secondary | ICD-10-CM | POA: Diagnosis not present

## 2022-12-21 DIAGNOSIS — E785 Hyperlipidemia, unspecified: Secondary | ICD-10-CM | POA: Diagnosis not present

## 2022-12-21 DIAGNOSIS — F411 Generalized anxiety disorder: Secondary | ICD-10-CM | POA: Diagnosis not present

## 2022-12-21 DIAGNOSIS — S22050A Wedge compression fracture of T5-T6 vertebra, initial encounter for closed fracture: Secondary | ICD-10-CM | POA: Diagnosis not present

## 2022-12-21 DIAGNOSIS — E039 Hypothyroidism, unspecified: Secondary | ICD-10-CM | POA: Diagnosis not present

## 2022-12-22 ENCOUNTER — Other Ambulatory Visit: Payer: Self-pay | Admitting: Family

## 2022-12-28 ENCOUNTER — Other Ambulatory Visit: Payer: Self-pay | Admitting: Family Medicine

## 2022-12-28 DIAGNOSIS — E785 Hyperlipidemia, unspecified: Secondary | ICD-10-CM

## 2022-12-30 ENCOUNTER — Telehealth: Payer: Medicare HMO | Admitting: Family

## 2022-12-31 ENCOUNTER — Ambulatory Visit (INDEPENDENT_AMBULATORY_CARE_PROVIDER_SITE_OTHER): Payer: Medicare HMO

## 2022-12-31 ENCOUNTER — Other Ambulatory Visit: Payer: Medicare HMO

## 2022-12-31 DIAGNOSIS — Z6828 Body mass index (BMI) 28.0-28.9, adult: Secondary | ICD-10-CM | POA: Diagnosis not present

## 2022-12-31 DIAGNOSIS — E785 Hyperlipidemia, unspecified: Secondary | ICD-10-CM | POA: Diagnosis not present

## 2022-12-31 DIAGNOSIS — E538 Deficiency of other specified B group vitamins: Secondary | ICD-10-CM | POA: Diagnosis not present

## 2022-12-31 DIAGNOSIS — E039 Hypothyroidism, unspecified: Secondary | ICD-10-CM

## 2022-12-31 LAB — LIPID PANEL

## 2022-12-31 NOTE — Progress Notes (Signed)
Cyanocobalamin injection given to left deltoid.  Patient tolerated well. 

## 2023-01-01 LAB — CBC WITH DIFFERENTIAL/PLATELET
Basophils Absolute: 0 10*3/uL (ref 0.0–0.2)
Basos: 1 %
EOS (ABSOLUTE): 0.1 10*3/uL (ref 0.0–0.4)
Eos: 3 %
Hematocrit: 40.1 % (ref 34.0–46.6)
Hemoglobin: 13.1 g/dL (ref 11.1–15.9)
Immature Grans (Abs): 0 10*3/uL (ref 0.0–0.1)
Immature Granulocytes: 1 %
Lymphocytes Absolute: 1.6 10*3/uL (ref 0.7–3.1)
Lymphs: 36 %
MCH: 29.7 pg (ref 26.6–33.0)
MCHC: 32.7 g/dL (ref 31.5–35.7)
MCV: 91 fL (ref 79–97)
Monocytes Absolute: 0.5 10*3/uL (ref 0.1–0.9)
Monocytes: 10 %
Neutrophils Absolute: 2.2 10*3/uL (ref 1.4–7.0)
Neutrophils: 49 %
Platelets: 282 10*3/uL (ref 150–450)
RBC: 4.41 x10E6/uL (ref 3.77–5.28)
RDW: 12.7 % (ref 11.7–15.4)
WBC: 4.4 10*3/uL (ref 3.4–10.8)

## 2023-01-01 LAB — CMP14+EGFR
ALT: 15 IU/L (ref 0–32)
AST: 20 IU/L (ref 0–40)
Albumin: 4.3 g/dL (ref 3.8–4.8)
Alkaline Phosphatase: 114 IU/L (ref 44–121)
BUN/Creatinine Ratio: 27 (ref 12–28)
BUN: 19 mg/dL (ref 8–27)
Bilirubin Total: 0.4 mg/dL (ref 0.0–1.2)
CO2: 22 mmol/L (ref 20–29)
Calcium: 9.6 mg/dL (ref 8.7–10.3)
Chloride: 101 mmol/L (ref 96–106)
Creatinine, Ser: 0.71 mg/dL (ref 0.57–1.00)
Globulin, Total: 2.2 g/dL (ref 1.5–4.5)
Glucose: 105 mg/dL — ABNORMAL HIGH (ref 70–99)
Potassium: 3.7 mmol/L (ref 3.5–5.2)
Sodium: 141 mmol/L (ref 134–144)
Total Protein: 6.5 g/dL (ref 6.0–8.5)
eGFR: 90 mL/min/{1.73_m2} (ref >59)

## 2023-01-01 LAB — TSH: TSH: 2.46 u[IU]/mL (ref 0.450–4.500)

## 2023-01-01 LAB — LIPID PANEL
Cholesterol, Total: 185 mg/dL (ref 100–199)
HDL: 48 mg/dL (ref >39)
LDL CALC COMMENT:: 3.9 ratio (ref 0.0–4.4)
LDL Chol Calc (NIH): 109 mg/dL — ABNORMAL HIGH (ref 0–99)
Triglycerides: 161 mg/dL — ABNORMAL HIGH (ref 0–149)
VLDL Cholesterol Cal: 28 mg/dL (ref 5–40)

## 2023-01-03 ENCOUNTER — Telehealth: Payer: Self-pay | Admitting: Family Medicine

## 2023-01-03 NOTE — Telephone Encounter (Signed)
Copied from CRM [id number]. Topic: Clinical - Lab/Test Results >> Jan 03, 2023  1:58 PM Sasha H wrote: Reason for CRM: Pt would like a call regarding her lab results.

## 2023-01-03 NOTE — Telephone Encounter (Signed)
Patient aware and verbalized understanding. °

## 2023-01-14 DIAGNOSIS — Z6827 Body mass index (BMI) 27.0-27.9, adult: Secondary | ICD-10-CM | POA: Diagnosis not present

## 2023-01-14 DIAGNOSIS — E785 Hyperlipidemia, unspecified: Secondary | ICD-10-CM | POA: Diagnosis not present

## 2023-01-14 DIAGNOSIS — E039 Hypothyroidism, unspecified: Secondary | ICD-10-CM | POA: Diagnosis not present

## 2023-01-17 ENCOUNTER — Ambulatory Visit (INDEPENDENT_AMBULATORY_CARE_PROVIDER_SITE_OTHER): Payer: Medicare HMO | Admitting: Family

## 2023-01-17 ENCOUNTER — Telehealth: Payer: Medicare HMO | Admitting: Family

## 2023-01-17 ENCOUNTER — Ambulatory Visit: Payer: Self-pay | Admitting: Family

## 2023-01-17 ENCOUNTER — Encounter: Payer: Self-pay | Admitting: Family

## 2023-01-17 ENCOUNTER — Telehealth: Payer: Self-pay

## 2023-01-17 VITALS — BP 105/82 | HR 86 | Temp 97.2°F | Ht 63.0 in | Wt 153.8 lb

## 2023-01-17 DIAGNOSIS — J101 Influenza due to other identified influenza virus with other respiratory manifestations: Secondary | ICD-10-CM | POA: Diagnosis not present

## 2023-01-17 DIAGNOSIS — R509 Fever, unspecified: Secondary | ICD-10-CM | POA: Diagnosis not present

## 2023-01-17 LAB — VERITOR FLU A/B WAIVED
Influenza A: POSITIVE — AB
Influenza B: NEGATIVE

## 2023-01-17 MED ORDER — BENZONATATE 200 MG PO CAPS: 200.0000 mg | ORAL_CAPSULE | Freq: Three times a day (TID) | ORAL | 1 refills | Status: DC | PRN

## 2023-01-17 MED ORDER — CETIRIZINE HCL 10 MG PO TABS: 10.0000 mg | ORAL_TABLET | Freq: Every day | ORAL | 1 refills | Status: DC

## 2023-01-17 MED ORDER — FLUTICASONE PROPIONATE 50 MCG/ACT NA SUSP
2.0000 | Freq: Every day | NASAL | 1 refills | Status: AC | PRN
Start: 2023-01-17 — End: ?

## 2023-01-17 NOTE — Progress Notes (Signed)
Subjective:    Patient ID: Jaclyn Schmidt, female    DOB: Dec 15, 1950, 72 y.o.   MRN: [id number]  Chief Complaint  Patient presents with   Fever    Sick all weekend    Pt presents to the office today with body aches, fever, and cough that started three days ago.  Fever  Associated symptoms include congestion, coughing, ear pain, headaches and a sore throat.  URI  This is a new problem. The current episode started in the past 7 days. The problem has been unchanged. Associated symptoms include congestion, coughing, ear pain, headaches, rhinorrhea, sinus pain, sneezing and a sore throat. She has tried acetaminophen, decongestant and increased fluids for the symptoms. The treatment provided mild relief.      Review of Systems  Constitutional:  Positive for fever.  HENT:  Positive for congestion, ear pain, rhinorrhea, sinus pain, sneezing and sore throat.   Respiratory:  Positive for cough.   Neurological:  Positive for headaches.  All other systems reviewed and are negative.   Social History   Socioeconomic History   Marital status: Significant Other    Spouse name: Not on file   Number of children: 1   Years of education: 12   Highest education level: High school graduate  Occupational History   Occupation: Retired     Associate Professor: [employer]   Occupation: Retired    Comment: Comptroller  Tobacco Use   Smoking status: Some Days    Current packs/day: 0.00    Average packs/day: 0.3 packs/day for 44.6 years (11.1 ttl pk-yrs)    Types: Cigarettes    Start date: 09/30/1973    Last attempt to quit: 04/26/2018    Years since quitting: 4.7   Smokeless tobacco: Never   Tobacco comments:    a pack per month  Vaping Use   Vaping status: Never Used  Substance and Sexual Activity   Alcohol use: No    Alcohol/week: 0.0 standard drinks of alcohol   Drug use: No   Sexual activity: Not on file  Other Topics Concern   Not on file  Social History Narrative   3 grandchildren. Enjoys dancing  and going out to eat.    Social Drivers of Corporate investment banker Strain: Low Risk  (08/11/2022)   Received from [facility]   Overall Financial Resource Strain (CARDIA)    Difficulty of Paying Living Expenses: Not hard at all  Food Insecurity: No Food Insecurity (08/11/2022)   Received from [facility]   Hunger Vital Sign    Worried About Running Out of Food in the Last Year: Never true    Ran Out of Food in the Last Year: Never true  Transportation Needs: No Transportation Needs (08/11/2022)   Received from [facility] - Transportation    Lack of Transportation (Medical): No    Lack of Transportation (Non-Medical): No  Physical Activity: Sufficiently Active (04/12/2022)   Exercise Vital Sign    Days of Exercise per Week: 5 days    Minutes of Exercise per Session: 30 min  Stress: No Stress Concern Present (04/12/2022)   [employer] of Occupational Health - Occupational Stress Questionnaire    Feeling of Stress : Not at all  Social Connections: Moderately Integrated (04/12/2022)   Social Connection and Isolation Panel [NHANES]    Frequency of Communication with Friends and Family: More than three times a week    Frequency of Social Gatherings with Friends and Family: More  than three times a week    Attends Religious Services: More than 4 times per year    Active Member of Clubs or Organizations: Yes    Attends Engineer, structural: More than 4 times per year    Marital Status: Divorced   Family History  Problem Relation Age of Onset   Cancer Mother        stomach   Heart disease Father    Cancer Maternal Aunt        ovarian   Cancer Paternal Aunt        breast   Diabetes Brother    Hypertension Brother         Objective:   Physical Exam Vitals reviewed.  Constitutional:      General: She is not in acute distress.    Appearance: She is well-developed.  HENT:     Head: Normocephalic and atraumatic.     Right Ear: Tympanic membrane  normal.     Left Ear: Tympanic membrane normal.     Mouth/Throat:     Pharynx: Posterior oropharyngeal erythema present.  Eyes:     Pupils: Pupils are equal, round, and reactive to light.  Neck:     Thyroid: No thyromegaly.  Cardiovascular:     Rate and Rhythm: Normal rate and regular rhythm.     Heart sounds: Normal heart sounds. No murmur heard. Pulmonary:     Effort: Pulmonary effort is normal. No respiratory distress.     Breath sounds: Normal breath sounds. No wheezing.  Abdominal:     General: Bowel sounds are normal. There is no distension.     Palpations: Abdomen is soft.     Tenderness: There is no abdominal tenderness.  Musculoskeletal:        General: No tenderness. Normal range of motion.     Cervical back: Normal range of motion and neck supple.  Skin:    General: Skin is warm and dry.  Neurological:     Mental Status: She is alert and oriented to person, place, and time.     Cranial Nerves: No cranial nerve deficit.     Deep Tendon Reflexes: Reflexes are normal and symmetric.  Psychiatric:        Behavior: Behavior normal.        Thought Content: Thought content normal.        Judgment: Judgment normal.       BP 105/82   Pulse 86   Temp (!) 97.2 F (36.2 C) (Temporal)   Ht 5\' 3"  (1.6 m)   Wt 153 lb 12.8 oz (69.8 kg)   SpO2 95%   BMI 27.24 kg/m      Assessment & Plan:  Jaclyn Schmidt comes in today with chief complaint of Fever (Sick all weekend )   Diagnosis and orders addressed:  1. Fever, unspecified fever cause (Primary)  - Veritor Flu A/B Waived - Novel Coronavirus, NAA ([facility])  2. Influenza A Force fluids  Rest Tylenol  Follow up if symptoms worsen or do not improve  - fluticasone (FLONASE) 50 MCG/ACT nasal spray; Place 2 sprays into both nostrils daily as needed (FOR ALLERGIES.).  Dispense: 48 mL; Refill: 1 - cetirizine (ZYRTEC ALLERGY) 10 MG tablet; Take 1 tablet (10 mg total) by mouth daily.  Dispense: 90 tablet; Refill: 1 -  benzonatate (TESSALON) 200 MG capsule; Take 1 capsule (200 mg total) by mouth 3 (three) times daily as needed.  Dispense: 30 capsule; Refill: 1  Jannifer Rodney, FNP

## 2023-01-17 NOTE — Telephone Encounter (Signed)
 Copied from CRM [id number]. Topic: Appointments - Scheduling Inquiry for Clinic >> Jan 17, 2023  8:10 AM Fonda Kinder J wrote: Reason for CRM: Pt is having flu like symptoms and wanted to schedule an appointment for today but there is no availability. Pt says she can't wait until the 2nd and wants to know if there is anyway she can do a walk in or squeeze in

## 2023-01-17 NOTE — Telephone Encounter (Signed)
Copied from CRM [id number]. Topic: Clinical - Red Word Triage >> Jan 17, 2023  8:12 AM Fonda Kinder J wrote: Red Word that prompted transfer to Nurse Triage: Pain  Chief Complaint: flu like symptoms that started on Friday Symptoms: pain "everywhere", fever, cough, stuffy/runny nose Frequency: constant Pertinent Negatives: Patient denies any other symptoms Disposition: [] ED /[] Urgent Care (no appt availability in office) / [x] Appointment(In office/virtual)/ []  [facility]/ [] Home Care/ [] Refused Recommended Disposition /[] [facility]/ []  Follow-up with PCP Additional Notes: patient states "flu like symptoms" since Friday and they are getting worse.  States pain everywhere, cough, runny nose, congestion, tightness. Apt. Made for this am.  Instructed to go to er if becomes worse.   Reason for Disposition  Patient is HIGH RISK (e.g., age > 64 years, pregnant, HIV+, or chronic medical condition)  Answer Assessment - Initial Assessment Questions 1. WORST SYMPTOM: "What is your worst symptom?" (e.g., cough, runny nose, muscle aches, headache, sore throat, fever)     Muscle aches, "I hurt all over" 2. ONSET: "When did your flu symptoms start?"      Friday evening 3. COUGH: "How bad is the cough?"       "Tight" 4. RESPIRATORY DISTRESS: "Describe your breathing."      Saturday night. 5. FEVER: "Do you have a fever?" If Yes, ask: "What is your temperature, how was it measured, and when did it start?"     Yes, fever broke Saturday night 6. EXPOSURE: "Were you exposed to someone with influenza?"       denies 7. FLU VACCINE: "Did you get a flu shot this year?"     yes 8. HIGH RISK DISEASE: "Do you have any chronic medical problems?" (e.g., heart or lung disease, asthma, weak immune system, or other HIGH RISK conditions)     denies  Protocols used: Influenza (Flu) - [facility]

## 2023-01-17 NOTE — Telephone Encounter (Signed)
Left message for pt to call back to schedule a video visit in after hours for today if she is able to do a video visit.

## 2023-01-17 NOTE — Patient Instructions (Signed)

## 2023-01-18 ENCOUNTER — Telehealth: Payer: Self-pay

## 2023-01-18 ENCOUNTER — Ambulatory Visit: Payer: Medicare HMO

## 2023-01-18 LAB — NOVEL CORONAVIRUS, NAA: SARS-CoV-2, NAA: NOT DETECTED

## 2023-01-18 NOTE — Telephone Encounter (Signed)
 Patient is asking for tamiflu to be sent into pharmacy [facility]

## 2023-01-18 NOTE — Telephone Encounter (Signed)
Can only start Tamiflu within first 48 hours of symptoms. She is out of the window. Continue OTC medications as discussed.

## 2023-01-18 NOTE — Telephone Encounter (Signed)
Patient aware and verbalizes understanding. 

## 2023-01-18 NOTE — Telephone Encounter (Signed)
 Copied from CRM [id number]. Topic: Clinical - Medical Advice >> Jan 18, 2023 10:21 AM Carlatta H wrote: Reason for CRM: Patient would like to have Nurse Morrie Sheldon give her a call

## 2023-01-28 DIAGNOSIS — E785 Hyperlipidemia, unspecified: Secondary | ICD-10-CM | POA: Diagnosis not present

## 2023-01-28 DIAGNOSIS — Z6826 Body mass index (BMI) 26.0-26.9, adult: Secondary | ICD-10-CM | POA: Diagnosis not present

## 2023-01-31 ENCOUNTER — Ambulatory Visit: Payer: Medicare HMO | Admitting: Family

## 2023-01-31 ENCOUNTER — Encounter: Payer: Self-pay | Admitting: Family

## 2023-01-31 VITALS — BP 113/76 | HR 95 | Temp 98.0°F | Ht 63.0 in | Wt 152.0 lb

## 2023-01-31 DIAGNOSIS — M546 Pain in thoracic spine: Secondary | ICD-10-CM

## 2023-01-31 DIAGNOSIS — Z0000 Encounter for general adult medical examination without abnormal findings: Secondary | ICD-10-CM

## 2023-01-31 DIAGNOSIS — Z0001 Encounter for general adult medical examination with abnormal findings: Secondary | ICD-10-CM

## 2023-01-31 DIAGNOSIS — Z853 Personal history of malignant neoplasm of breast: Secondary | ICD-10-CM

## 2023-01-31 DIAGNOSIS — E785 Hyperlipidemia, unspecified: Secondary | ICD-10-CM | POA: Diagnosis not present

## 2023-01-31 DIAGNOSIS — E538 Deficiency of other specified B group vitamins: Secondary | ICD-10-CM | POA: Diagnosis not present

## 2023-01-31 DIAGNOSIS — F339 Major depressive disorder, recurrent, unspecified: Secondary | ICD-10-CM

## 2023-01-31 DIAGNOSIS — Z79899 Other long term (current) drug therapy: Secondary | ICD-10-CM

## 2023-01-31 DIAGNOSIS — M545 Low back pain, unspecified: Secondary | ICD-10-CM

## 2023-01-31 DIAGNOSIS — S22050S Wedge compression fracture of T5-T6 vertebra, sequela: Secondary | ICD-10-CM

## 2023-01-31 DIAGNOSIS — F411 Generalized anxiety disorder: Secondary | ICD-10-CM

## 2023-01-31 DIAGNOSIS — K6289 Other specified diseases of anus and rectum: Secondary | ICD-10-CM

## 2023-01-31 DIAGNOSIS — E039 Hypothyroidism, unspecified: Secondary | ICD-10-CM | POA: Diagnosis not present

## 2023-01-31 DIAGNOSIS — J410 Simple chronic bronchitis: Secondary | ICD-10-CM | POA: Diagnosis not present

## 2023-01-31 DIAGNOSIS — G8929 Other chronic pain: Secondary | ICD-10-CM

## 2023-01-31 MED ORDER — ALPRAZOLAM 0.5 MG PO TABS
0.5000 mg | ORAL_TABLET | Freq: Every evening | ORAL | 2 refills | Status: DC | PRN
Start: 2023-01-31 — End: 2023-06-17

## 2023-01-31 MED ORDER — TRIAMCINOLONE ACETONIDE 0.5 % EX OINT: 1.0000 | TOPICAL_OINTMENT | Freq: Two times a day (BID) | CUTANEOUS | 0 refills | Status: DC

## 2023-01-31 MED ORDER — LEVOTHYROXINE SODIUM 88 MCG PO TABS: 88.0000 ug | ORAL_TABLET | Freq: Every day | ORAL | 1 refills | Status: DC

## 2023-01-31 NOTE — Progress Notes (Signed)
 Subjective:    Patient ID: Jaclyn Schmidt, female    DOB: 1950/01/26, 73 y.o.   MRN: [id number]  Chief Complaint  Patient presents with   Medical Management of Chronic Issues    PT presents to the office today for CPE and chronic follow up. She is followed by Oncologists every 6 months for Right Breast cancer. She completed radiation 2020. States she is doing well at this time. She has anal sphincter incompetence and taking Metamucil as needed. She does pelvic floor exercises daily.    She also has Vit B 12 deficiency and gets monthly injections.    She has COPD and states her breathing is stable. She continues to smoke 1/2 pack.    She has osteopenia and had dexa scan 08/25/21. Takes Vit D and calcium  daily.   She fell on her porch on 12/18/22 and had compression fracture of T6. She is in a brace and followed by Ortho.  Thyroid  Problem Presents for follow-up visit. Symptoms include anxiety, diarrhea and dry skin. Patient reports no fatigue or hoarse voice. The symptoms have been stable.  Back Pain This is a chronic problem. The current episode started more than 1 year ago. The problem occurs intermittently. The problem has been waxing and waning since onset. The pain is present in the lumbar spine and thoracic spine. The quality of the pain is described as aching. The pain is at a severity of 8/10. The pain is moderate. The symptoms are aggravated by standing. Associated symptoms include leg pain. Risk factors include recent trauma. She has tried muscle relaxant and bed rest for the symptoms. The treatment provided mild relief.  Hyperlipidemia This is a chronic problem. The current episode started more than 1 year ago. Recent lipid tests were reviewed and are normal. Exacerbating diseases include obesity. Associated symptoms include leg pain. Current antihyperlipidemic treatment includes statins. The current treatment provides moderate improvement of lipids. Risk factors for coronary artery  disease include dyslipidemia, hypertension, a sedentary lifestyle and post-menopausal.  Anxiety Presents for follow-up visit. Symptoms include excessive worry, irritability, nervous/anxious behavior and restlessness. Symptoms occur occasionally. The severity of symptoms is mild.    Depression        This is a chronic problem.  The current episode started more than 1 year ago.   The problem occurs intermittently.  Associated symptoms include restlessness and sad.  Associated symptoms include no fatigue, no helplessness and no hopelessness.  Past treatments include SSRIs - Selective serotonin reuptake inhibitors.  Past medical history includes thyroid  problem and anxiety.   Nicotine Dependence Presents for follow-up visit. Symptoms include irritability. Symptoms are negative for fatigue. The symptoms have been stable. She smokes < 1/2 a pack of cigarettes per day.      Review of Systems  Constitutional:  Positive for irritability. Negative for fatigue.  HENT:  Negative for hoarse voice.   Gastrointestinal:  Positive for diarrhea.  Musculoskeletal:  Positive for back pain.  Psychiatric/Behavioral:  The patient is nervous/anxious.   All other systems reviewed and are negative.  Family History  Problem Relation Age of Onset   Cancer Mother        stomach   Heart disease Father    Cancer Maternal Aunt        ovarian   Cancer Paternal Aunt        breast   Diabetes Brother    Hypertension Brother    Social History   Socioeconomic History   Marital status: Significant Other  Spouse name: Not on file   Number of children: 1   Years of education: 12   Highest education level: High school graduate  Occupational History   Occupation: Retired     Associate Professor: [employer]   Occupation: Retired    Comment: comptroller  Tobacco Use   Smoking status: Some Days    Current packs/day: 0.00    Average packs/day: 0.3 packs/day for 44.6 years (11.1 ttl pk-yrs)    Types: Cigarettes    Start date:  09/30/1973    Last attempt to quit: 04/26/2018    Years since quitting: 4.7   Smokeless tobacco: Never   Tobacco comments:    a pack per month  Vaping Use   Vaping status: Never Used  Substance and Sexual Activity   Alcohol use: No    Alcohol/week: 0.0 standard drinks of alcohol   Drug use: No   Sexual activity: Not on file  Other Topics Concern   Not on file  Social History Narrative   3 grandchildren. Enjoys dancing and going out to eat.    Social Drivers of Corporate Investment Banker Strain: Low Risk  (08/11/2022)   Received from [facility]   Overall Financial Resource Strain (CARDIA)    Difficulty of Paying Living Expenses: Not hard at all  Food Insecurity: No Food Insecurity (08/11/2022)   Received from [facility]   Hunger Vital Sign    Worried About Running Out of Food in the Last Year: Never true    Ran Out of Food in the Last Year: Never true  Transportation Needs: No Transportation Needs (08/11/2022)   Received from [facility] - Transportation    Lack of Transportation (Medical): No    Lack of Transportation (Non-Medical): No  Physical Activity: Sufficiently Active (04/12/2022)   Exercise Vital Sign    Days of Exercise per Week: 5 days    Minutes of Exercise per Session: 30 min  Stress: No Stress Concern Present (04/12/2022)   [employer] of Occupational Health - Occupational Stress Questionnaire    Feeling of Stress : Not at all  Social Connections: Moderately Integrated (04/12/2022)   Social Connection and Isolation Panel [NHANES]    Frequency of Communication with Friends and Family: More than three times a week    Frequency of Social Gatherings with Friends and Family: More than three times a week    Attends Religious Services: More than 4 times per year    Active Member of [employer] or Organizations: Yes    Attends Engineer, Structural: More than 4 times per year    Marital Status: Divorced        Objective:   Physical  Exam Vitals reviewed.  Constitutional:      General: She is not in acute distress.    Appearance: She is well-developed.  HENT:     Head: Normocephalic and atraumatic.     Right Ear: Tympanic membrane normal.     Left Ear: Tympanic membrane normal.  Eyes:     Pupils: Pupils are equal, round, and reactive to light.  Neck:     Thyroid : No thyromegaly.  Cardiovascular:     Rate and Rhythm: Normal rate and regular rhythm.     Heart sounds: Normal heart sounds. No murmur heard. Pulmonary:     Effort: Pulmonary effort is normal. No respiratory distress.     Breath sounds: Normal breath sounds. No wheezing.  Abdominal:     General: Bowel sounds are  normal. There is no distension.     Palpations: Abdomen is soft.     Tenderness: There is no abdominal tenderness.  Musculoskeletal:        General: Tenderness present.     Cervical back: Normal range of motion and neck supple.     Comments: Mild pain in lumbar with flexion and extension, back brace in place  Skin:    General: Skin is warm and dry.  Neurological:     Mental Status: She is alert and oriented to person, place, and time.     Cranial Nerves: No cranial nerve deficit.     Deep Tendon Reflexes: Reflexes are normal and symmetric.  Psychiatric:        Behavior: Behavior normal.        Thought Content: Thought content normal.        Judgment: Judgment normal.         BP 113/76   Pulse 95   Temp 98 F (36.7 C)   Ht 5' 3 (1.6 m)   Wt 152 lb (68.9 kg)   SpO2 96%   BMI 26.93 kg/m   Assessment & Plan:   Jaclyn Schmidt comes in today with chief complaint of Medical Management of Chronic Issues   Diagnosis and orders addressed: 1. Annual physical exam (Primary) - CMP14+EGFR - CBC with Differential/Platelet - TSH - Vitamin B12  2. Lumbar pain - CMP14+EGFR - CBC with Differential/Platelet  3. Depression, recurrent (HCC)  - CMP14+EGFR - CBC with Differential/Platelet  4. Hypothyroidism, unspecified type -  CMP14+EGFR - CBC with Differential/Platelet - TSH - levothyroxine  (SYNTHROID ) 88 MCG tablet; Take 1 tablet (88 mcg total) by mouth daily.  Dispense: 90 tablet; Refill: 1  5. History of breast cancer - CMP14+EGFR - CBC with Differential/Platelet  6. Controlled substance agreement signed - CMP14+EGFR - CBC with Differential/Platelet - ALPRAZolam  (XANAX ) 0.5 MG tablet; Take 1 tablet (0.5 mg total) by mouth at bedtime as needed for anxiety.  Dispense: 20 tablet; Refill: 2  7. Vitamin B 12 deficiency - CMP14+EGFR - CBC with Differential/Platelet - Vitamin B12  8. Hyperlipidemia, unspecified hyperlipidemia type - CMP14+EGFR - CBC with Differential/Platelet  9. Simple chronic bronchitis (HCC) - CMP14+EGFR - CBC with Differential/Platelet  10. GAD (generalized anxiety disorder) - CMP14+EGFR - CBC with Differential/Platelet - ALPRAZolam  (XANAX ) 0.5 MG tablet; Take 1 tablet (0.5 mg total) by mouth at bedtime as needed for anxiety.  Dispense: 20 tablet; Refill: 2  11. Anal sphincter incompetence - CMP14+EGFR - CBC with Differential/Platelet  12. Chronic midline thoracic back pain - CMP14+EGFR - CBC with Differential/Platelet  13. Closed wedge compression fracture of T6 vertebra, sequela - CMP14+EGFR - CBC with Differential/Platelet  Labs pending Patient reviewed in [place] controlled database, no flags noted. Contract and drug screen up dated today. Continue current medications  Health Maintenance reviewed Diet and exercise encouraged  Follow up plan: 4 months    Bari Learn, FNP

## 2023-01-31 NOTE — Progress Notes (Deleted)
   Established Patient Office Visit  Subjective   Patient ID: Jaclyn Schmidt, female    DOB: 07/14/1950  Age: 73 y.o. MRN: [id number]  Chief Complaint  Patient presents with   Medical Management of Chronic Issues    HPI  {History (Optional):23778}  ROS    Objective:     BP 113/76   Pulse 95   Temp 98 F (36.7 C)   Ht 5' 3 (1.6 m)   Wt 152 lb (68.9 kg)   SpO2 96%   BMI 26.93 kg/m  {Vitals History (Optional):23777}  Physical Exam   No results found for any visits on 01/31/23.  {Labs (Optional):23779}  The 10-year ASCVD risk score (Arnett DK, et al., 2019) is: 14.2%    Assessment & Plan:   Problem List Items Addressed This Visit   None   No follow-ups on file.    Bari Learn, FNP

## 2023-01-31 NOTE — Patient Instructions (Signed)
Spinal Compression Fracture  A spinal compression fracture is a collapse of the bones that form the spine (vertebrae). With this type of fracture, the vertebrae become pushed (compressed) into a wedge shape. Most compression fractures happen in the middle or lower part of the spine. What are the causes? This condition may be caused by: Thinning and loss of density in the bones (osteoporosis). This is the most common cause. A fall. A car or motorcycle accident. Cancer. Trauma, such as a heavy, direct hit to the head or back. What increases the risk? You are more likely to develop this condition if: You are 60 years of age or older. You have osteoporosis. You have certain types of cancer, including: Multiple myeloma. Lymphoma. Prostate cancer. Lung cancer. Breast cancer. What are the signs or symptoms? Symptoms of this condition include: Severe pain with simple movements such as coughing or sneezing. Pain that gets worse over time. Pain that is worse when you stand, walk, sit, or bend. Sudden pain that is so bad that it is hard for you to move. Bending or humping of the spine. Gradual loss of height. Numbness, tingling, or weakness in the back and legs. Trouble walking. Your symptoms will depend on the cause of the fracture and how quickly it develops. How is this diagnosed? This condition may be diagnosed based on symptoms, medical history, and a physical exam. During the physical exam, your health care provider may tap along the length of your spine to check for tenderness. Tests may be done to confirm the diagnosis. They may include: A bone mineral density test to check for osteoporosis. Imaging tests, such as a spine X-ray, CT scan, or MRI. How is this treated? Treatment depends on the cause and severity of the condition. Some fractures may heal on their own with supportive care. Treatment may include: Pain medicine. Rest. A back brace. Physical therapy exercises. Medicine  to strengthen bone. Calcium and vitamin D supplements. Fractures that cause the back to become misshapen, cause nerve pain or weakness, or do not respond to other treatment may be treated with surgery. This may include: Vertebroplasty. Bone cement is injected into the collapsed vertebrae to stabilize them. Balloon kyphoplasty. The collapsed vertebrae are expanded with a balloon and then bone cement is injected into them. Spinal fusion. The collapsed vertebrae are connected (fused) to normal vertebrae. Follow these instructions at home: Medicines Take over-the-counter and prescription medicines only as told by your health care provider. Ask your health care provider if the medicine prescribed to you: Requires you to avoid driving or using machinery. Can cause constipation. You may need to take these actions to prevent or treat constipation: Drink enough fluid to keep your urine pale yellow. Take over-the-counter or prescription medicines. Eat foods that are high in fiber, such as beans, whole grains, and fresh fruits and vegetables. Limit foods that are high in fat and processed sugars, such as fried or sweet foods. If you have a brace: Wear the brace as told by your health care provider. Remove it only as told by your health care provider. Loosen the brace if your fingers or toes tingle, become numb, or turn cold and blue. Keep the brace clean. If the brace is not waterproof: Do not let it get wet. Cover it with a watertight covering when you take a bath or a shower. Managing pain, stiffness, and swelling  If directed, put ice on the injured area. To do this: If you have a removable brace, remove it as  told by your health care provider. Put ice in a plastic bag. Place a towel between your skin and the bag. Leave the ice on for 20 minutes, 2-3 times a day. Remove the ice if your skin turns bright red. This is very important. If you cannot feel pain, heat, or cold, you have a greater risk  of damage to the area. Activity Rest as told by your health care provider. Avoid sitting for a long time without moving. Get up to take short walks every 1-2 hours. This is important to improve blood flow and breathing. Ask for help if you feel weak or unsteady. Return to your normal activities as told by your health care provider. Ask what activities are safe for you. Do physical therapy exercises to improve movement and strength in your back, as recommended by your health care provider. Exercise regularly as directed by your health care provider. General instructions  Do not drink alcohol. Alcohol can interfere with your treatment. Do not use any products that contain nicotine or tobacco, such as cigarettes, e-cigarettes, and chewing tobacco. These can delay bone healing. If you need help quitting, ask your health care provider. Keep all follow-up visits. This is important. It can help to prevent permanent injury, disability, and long-lasting (chronic) pain. Contact a health care provider if: You have a fever. Your pain medicine is not helping. Your pain does not get better over time. You cannot return to your normal activities as planned or expected. Get help right away if: Your pain is very bad and it suddenly gets worse. You are unable to move any body part (paralysis) that is below the level of your injury. You have numbness, tingling, or weakness in any body part that is below the level of your injury. You cannot control your bladder or bowels. Summary A spinal compression fracture is a collapse of the bones that form the spine (vertebrae). With this type of fracture, the vertebrae become pushed (compressed) into a wedge shape. Your symptoms and treatment will depend on the cause and severity of the fracture and how quickly it develops. Some fractures may heal on their own with supportive care. Fractures that cause the back to become misshapen, cause nerve pain or weakness, or do not  respond to other treatment may be treated with surgery. This information is not intended to replace advice given to you by your health care provider. Make sure you discuss any questions you have with your health care provider. Document Revised: 04/25/2019 Document Reviewed: 04/25/2019 Elsevier Patient Education  2024 [employer].

## 2023-02-01 ENCOUNTER — Telehealth: Payer: Self-pay | Admitting: Family

## 2023-02-01 LAB — CMP14+EGFR
ALT: 19 [IU]/L (ref 0–32)
AST: 19 [IU]/L (ref 0–40)
Albumin: 4.5 g/dL (ref 3.8–4.8)
Alkaline Phosphatase: 88 [IU]/L (ref 44–121)
BUN/Creatinine Ratio: 23 (ref 12–28)
BUN: 19 mg/dL (ref 8–27)
Bilirubin Total: 0.4 mg/dL (ref 0.0–1.2)
CO2: 22 mmol/L (ref 20–29)
Calcium: 9.4 mg/dL (ref 8.7–10.3)
Chloride: 103 mmol/L (ref 96–106)
Creatinine, Ser: 0.81 mg/dL (ref 0.57–1.00)
Globulin, Total: 2.4 g/dL (ref 1.5–4.5)
Glucose: 91 mg/dL (ref 70–99)
Potassium: 4.5 mmol/L (ref 3.5–5.2)
Sodium: 139 mmol/L (ref 134–144)
Total Protein: 6.9 g/dL (ref 6.0–8.5)
eGFR: 77 mL/min/{1.73_m2} (ref >59)

## 2023-02-01 LAB — CBC WITH DIFFERENTIAL/PLATELET
Basophils Absolute: 0 10*3/uL (ref 0.0–0.2)
Basos: 1 %
EOS (ABSOLUTE): 0.1 10*3/uL (ref 0.0–0.4)
Eos: 3 %
Hematocrit: 42.3 % (ref 34.0–46.6)
Hemoglobin: 14 g/dL (ref 11.1–15.9)
Immature Grans (Abs): 0 10*3/uL (ref 0.0–0.1)
Immature Granulocytes: 0 %
Lymphocytes Absolute: 1.6 10*3/uL (ref 0.7–3.1)
Lymphs: 42 %
MCH: 29.6 pg (ref 26.6–33.0)
MCHC: 33.1 g/dL (ref 31.5–35.7)
MCV: 89 fL (ref 79–97)
Monocytes Absolute: 0.5 10*3/uL (ref 0.1–0.9)
Monocytes: 13 %
Neutrophils Absolute: 1.6 10*3/uL (ref 1.4–7.0)
Neutrophils: 41 %
Platelets: 305 10*3/uL (ref 150–450)
RBC: 4.73 x10E6/uL (ref 3.77–5.28)
RDW: 12.5 % (ref 11.7–15.4)
WBC: 3.8 10*3/uL (ref 3.4–10.8)

## 2023-02-01 LAB — TSH: TSH: 0.834 u[IU]/mL (ref 0.450–4.500)

## 2023-02-01 LAB — VITAMIN B12

## 2023-02-01 NOTE — Telephone Encounter (Signed)
 PT CALLED IN WANTING  DISABILITY PARKING PLACARD  forms to be completed and signed.  Form Fee Paid? (Y/N)      N PT WILL COME BY AND PAY      If NO, form is placed on front office manager desk to hold until payment received. If YES, then form will be placed in the RX/HH Nurse Coordinators box for completion.  Form will not be processed until payment is received

## 2023-02-03 ENCOUNTER — Telehealth: Payer: Self-pay | Admitting: Family

## 2023-02-03 DIAGNOSIS — Z0279 Encounter for issue of other medical certificate: Secondary | ICD-10-CM

## 2023-02-04 DIAGNOSIS — E039 Hypothyroidism, unspecified: Secondary | ICD-10-CM | POA: Diagnosis not present

## 2023-02-04 DIAGNOSIS — Z6826 Body mass index (BMI) 26.0-26.9, adult: Secondary | ICD-10-CM | POA: Diagnosis not present

## 2023-02-09 ENCOUNTER — Other Ambulatory Visit: Payer: Self-pay | Admitting: Family

## 2023-02-09 DIAGNOSIS — E785 Hyperlipidemia, unspecified: Secondary | ICD-10-CM

## 2023-02-09 NOTE — Telephone Encounter (Signed)
Pt aware handicap form ready

## 2023-02-10 DIAGNOSIS — S22050A Wedge compression fracture of T5-T6 vertebra, initial encounter for closed fracture: Secondary | ICD-10-CM | POA: Diagnosis not present

## 2023-02-10 DIAGNOSIS — Z6827 Body mass index (BMI) 27.0-27.9, adult: Secondary | ICD-10-CM | POA: Diagnosis not present

## 2023-02-11 DIAGNOSIS — M8588 Other specified disorders of bone density and structure, other site: Secondary | ICD-10-CM | POA: Diagnosis not present

## 2023-02-11 DIAGNOSIS — Z6826 Body mass index (BMI) 26.0-26.9, adult: Secondary | ICD-10-CM | POA: Diagnosis not present

## 2023-02-11 DIAGNOSIS — F17210 Nicotine dependence, cigarettes, uncomplicated: Secondary | ICD-10-CM | POA: Diagnosis not present

## 2023-02-11 DIAGNOSIS — Z5181 Encounter for therapeutic drug level monitoring: Secondary | ICD-10-CM | POA: Diagnosis not present

## 2023-02-11 DIAGNOSIS — Z170 Estrogen receptor positive status [ER+]: Secondary | ICD-10-CM | POA: Diagnosis not present

## 2023-02-11 DIAGNOSIS — D709 Neutropenia, unspecified: Secondary | ICD-10-CM | POA: Diagnosis not present

## 2023-02-11 DIAGNOSIS — E785 Hyperlipidemia, unspecified: Secondary | ICD-10-CM | POA: Diagnosis not present

## 2023-02-11 DIAGNOSIS — E538 Deficiency of other specified B group vitamins: Secondary | ICD-10-CM | POA: Diagnosis not present

## 2023-02-11 DIAGNOSIS — Z881 Allergy status to other antibiotic agents status: Secondary | ICD-10-CM | POA: Diagnosis not present

## 2023-02-11 DIAGNOSIS — C50511 Malignant neoplasm of lower-outer quadrant of right female breast: Secondary | ICD-10-CM | POA: Diagnosis not present

## 2023-02-11 DIAGNOSIS — Z79811 Long term (current) use of aromatase inhibitors: Secondary | ICD-10-CM | POA: Diagnosis not present

## 2023-02-11 DIAGNOSIS — Z880 Allergy status to penicillin: Secondary | ICD-10-CM | POA: Diagnosis not present

## 2023-02-14 ENCOUNTER — Other Ambulatory Visit: Payer: Self-pay | Admitting: Student

## 2023-02-14 DIAGNOSIS — S22050A Wedge compression fracture of T5-T6 vertebra, initial encounter for closed fracture: Secondary | ICD-10-CM

## 2023-02-18 DIAGNOSIS — Z6826 Body mass index (BMI) 26.0-26.9, adult: Secondary | ICD-10-CM | POA: Diagnosis not present

## 2023-02-18 DIAGNOSIS — E039 Hypothyroidism, unspecified: Secondary | ICD-10-CM | POA: Diagnosis not present

## 2023-02-18 DIAGNOSIS — E785 Hyperlipidemia, unspecified: Secondary | ICD-10-CM | POA: Diagnosis not present

## 2023-03-03 ENCOUNTER — Ambulatory Visit (INDEPENDENT_AMBULATORY_CARE_PROVIDER_SITE_OTHER): Payer: Medicare HMO

## 2023-03-03 DIAGNOSIS — E538 Deficiency of other specified B group vitamins: Secondary | ICD-10-CM

## 2023-03-03 NOTE — Progress Notes (Signed)
Pt given cyanocabalamin injection left deltoid pt tolerated well.

## 2023-03-04 DIAGNOSIS — Z853 Personal history of malignant neoplasm of breast: Secondary | ICD-10-CM | POA: Diagnosis not present

## 2023-03-04 DIAGNOSIS — Z6825 Body mass index (BMI) 25.0-25.9, adult: Secondary | ICD-10-CM | POA: Diagnosis not present

## 2023-03-06 ENCOUNTER — Other Ambulatory Visit: Payer: Self-pay | Admitting: Family

## 2023-03-06 DIAGNOSIS — F339 Major depressive disorder, recurrent, unspecified: Secondary | ICD-10-CM

## 2023-03-06 DIAGNOSIS — F411 Generalized anxiety disorder: Secondary | ICD-10-CM

## 2023-03-10 DIAGNOSIS — Z6825 Body mass index (BMI) 25.0-25.9, adult: Secondary | ICD-10-CM | POA: Diagnosis not present

## 2023-03-10 DIAGNOSIS — E785 Hyperlipidemia, unspecified: Secondary | ICD-10-CM | POA: Diagnosis not present

## 2023-03-11 ENCOUNTER — Ambulatory Visit
Admission: RE | Admit: 2023-03-11 | Discharge: 2023-03-11 | Disposition: A | Discharge status: Home / Self Care | Payer: Medicare HMO | Source: Ambulatory Visit | Attending: Student | Admitting: Student

## 2023-03-11 DIAGNOSIS — S22050A Wedge compression fracture of T5-T6 vertebra, initial encounter for closed fracture: Secondary | ICD-10-CM

## 2023-03-11 DIAGNOSIS — Z4789 Encounter for other orthopedic aftercare: Secondary | ICD-10-CM | POA: Diagnosis not present

## 2023-03-15 ENCOUNTER — Ambulatory Visit: Payer: Medicare HMO

## 2023-03-29 DIAGNOSIS — Z6825 Body mass index (BMI) 25.0-25.9, adult: Secondary | ICD-10-CM | POA: Diagnosis not present

## 2023-03-29 DIAGNOSIS — S22050A Wedge compression fracture of T5-T6 vertebra, initial encounter for closed fracture: Secondary | ICD-10-CM | POA: Diagnosis not present

## 2023-03-31 ENCOUNTER — Ambulatory Visit (INDEPENDENT_AMBULATORY_CARE_PROVIDER_SITE_OTHER): Payer: Medicare HMO

## 2023-03-31 DIAGNOSIS — E538 Deficiency of other specified B group vitamins: Secondary | ICD-10-CM

## 2023-03-31 NOTE — Progress Notes (Signed)
 Patient is in office today for a nurse visit for B12 Injection. Patient Injection was given in the  Left deltoid. Patient tolerated injection well.

## 2023-04-14 ENCOUNTER — Other Ambulatory Visit: Payer: Self-pay | Admitting: Family

## 2023-04-18 ENCOUNTER — Encounter

## 2023-04-18 ENCOUNTER — Ambulatory Visit (INDEPENDENT_AMBULATORY_CARE_PROVIDER_SITE_OTHER): Payer: Medicare HMO

## 2023-04-18 VITALS — BP 113/76 | HR 95 | Ht 63.0 in | Wt 152.0 lb

## 2023-04-18 DIAGNOSIS — Z0000 Encounter for general adult medical examination without abnormal findings: Secondary | ICD-10-CM | POA: Diagnosis not present

## 2023-04-18 DIAGNOSIS — S22059A Unspecified fracture of T5-T6 vertebra, initial encounter for closed fracture: Secondary | ICD-10-CM | POA: Insufficient documentation

## 2023-04-18 NOTE — Progress Notes (Deleted)
 Marland Kitchen

## 2023-04-18 NOTE — Progress Notes (Deleted)
 Jaclyn Schmidt

## 2023-04-18 NOTE — Patient Instructions (Signed)
 Jaclyn Schmidt , Thank you for taking time to come for your Medicare Wellness Visit. I appreciate your ongoing commitment to your health goals. Please review the following plan we discussed and let me know if I can assist you in the future.   Referrals/Orders/Follow-Ups/Clinician Recommendations: Keep up the good work at [facility].   This is a list of the screening recommended for you and due dates:  Health Maintenance  Topic Date Due   COVID-19 Vaccine (4 - 2024-25 season) 05/03/2024*   Mammogram  06/17/2023   DEXA scan (bone density measurement)  08/26/2023   Medicare Annual Wellness Visit  04/17/2024   DTaP/Tdap/Td vaccine (2 - Td or Tdap) 02/18/2030   Colon Cancer Screening  12/30/2030   Pneumonia Vaccine  Completed   Flu Shot  Completed   Hepatitis C Screening  Completed   Zoster (Shingles) Vaccine  Completed   HPV Vaccine  Aged Out  *Topic was postponed. The date shown is not the original due date.    Advanced directives: (Declined) Advance directive discussed with you today. Even though you declined this today, please call our office should you change your mind, and we can give you the proper paperwork for you to fill out.  Next Medicare Annual Wellness Visit scheduled for next year: Yes

## 2023-04-18 NOTE — Progress Notes (Cosign Needed Addendum)
 Subjective:   Jaclyn Schmidt is a 73 y.o. who presents for a Medicare Wellness preventive visit.  Visit Complete: Virtual I connected with  VIDHI DELELLIS on 04/18/23 by a audio enabled telemedicine application and verified that I am speaking with the correct person using two identifiers.  Patient Location: Home  Provider Location: Home Office  I discussed the limitations of evaluation and management by telemedicine. The patient expressed understanding and agreed to proceed.  Vital Signs: Because this visit was a virtual/telehealth visit, some criteria may be missing or patient reported. Any vitals not documented were not able to be obtained and vitals that have been documented are patient reported.  VideoDeclined- This patient declined Librarian, academic. Therefore the visit was completed with audio only.  Persons Participating in Visit: Patient.  AWV Questionnaire: No: Patient Medicare AWV questionnaire was not completed prior to this visit.  Cardiac Risk Factors include: advanced age (>63men, >39 women);dyslipidemia     Objective:    Today's Vitals   04/18/23 1325  BP: 113/76  Pulse: 95  Weight: 152 lb (68.9 kg)  Height: 5\' 3"  (1.6 m)  PainSc: 5    Body mass index is 26.93 kg/m.     04/18/2023    1:38 PM 12/18/2022    4:46 PM 04/12/2022   10:54 AM 03/17/2021    8:25 AM 03/14/2020    8:18 AM 01/12/2020    9:58 AM 03/13/2019    8:49 AM  Advanced Directives  Does Patient Have a Medical Advance Directive? No No No No No No No  Would patient like information on creating a medical advance directive?  No - Patient declined No - Patient declined No - Patient declined No - Patient declined  No - Patient declined    Current Medications (verified) Outpatient Encounter Medications as of 04/18/2023  Medication Sig   ALPRAZolam (XANAX) 0.5 MG tablet Take 1 tablet (0.5 mg total) by mouth at bedtime as needed for anxiety.   anastrozole (ARIMIDEX) 1 MG  tablet Take 1 tablet by mouth daily.   baclofen (LIORESAL) 10 MG tablet Take 1 tablet (10 mg total) by mouth 3 (three) times daily.   BIOTIN PO Take 2 tablets by mouth daily.   calcium carbonate (TUMS - DOSED IN MG ELEMENTAL CALCIUM) 500 MG chewable tablet Chew 2 tablets by mouth daily.   cetirizine (ZYRTEC ALLERGY) 10 MG tablet Take 1 tablet (10 mg total) by mouth daily.   Cholecalciferol (VITAMIN D3 PO) Take 4,000 Int'l Units by mouth daily.   fluticasone (FLONASE) 50 MCG/ACT nasal spray Place 2 sprays into both nostrils daily as needed (FOR ALLERGIES.).   gabapentin (NEURONTIN) 300 MG capsule TAKE TWO CAPSULES TWICE DAILY   levothyroxine (SYNTHROID) 88 MCG tablet Take 1 tablet (88 mcg total) by mouth daily.   meclizine (ANTIVERT) 25 MG tablet TAKE 1 TABLET BY MOUTH 3 TIMES A DAY AS NEEDED FOR DIZZINESS   meloxicam (MOBIC) 15 MG tablet Take 1 tablet by mouth once daily   oxyCODONE (ROXICODONE) 5 MG immediate release tablet Take 1 tablet (5 mg total) by mouth every 6 (six) hours as needed for severe pain (pain score 7-10).   pravastatin (PRAVACHOL) 40 MG tablet TAKE 1 TABLET BY MOUTH EVERY DAY   SEMAGLUTIDE, 1 MG/DOSE, [place] Inject into the skin.   sertraline (ZOLOFT) 100 MG tablet TAKE 1 TABLET BY MOUTH EVERY DAY   triamcinolone ointment (KENALOG) 0.5 % Apply 1 Application topically 2 (two) times daily.  Facility-Administered Encounter Medications as of 04/18/2023  Medication   cyanocobalamin ((VITAMIN B-12)) injection 1,000 mcg    Allergies (verified) Penicillins and Doxycycline   History: Past Medical History:  Diagnosis Date   Anxiety    Breast cancer (HCC) 05/2018   Right Breast Cancer   Cancer (HCC) 04/2018   right breast cancer   Cholelithiasis    Depression    GERD (gastroesophageal reflux disease)    History of kidney stones    Hyperlipidemia    Hypothyroidism    Irritable bowel syndrome    Personal history of radiation therapy 2020   Right Breast Cancer   Pneumonia     Thyroid disease    Vitamin D deficiency    Past Surgical History:  Procedure Laterality Date   ABDOMINAL HYSTERECTOMY  [id number]   ANTERIOR CERVICAL DECOMP/DISCECTOMY FUSION N/A 01/21/2017   Procedure: C3-4, C4-5 ANTERIOR CERVICAL DISCECTOMY AND FUSION, ALLOGRAFT, PLATE;  Surgeon: Eldred Manges, MD;  Location: [facility];  Service: Orthopedics;  Laterality: N/A;   APPENDECTOMY  1996   BREAST LUMPECTOMY Right 05/30/2018   BREAST LUMPECTOMY WITH RADIOACTIVE SEED AND SENTINEL LYMPH NODE BIOPSY Right 05/30/2018   Procedure: RIGHT BREAST LUMPECTOMY WITH RADIOACTIVE SEED AND RIGHT AXILLARY SENTINEL LYMPH NODE BIOPSY;  Surgeon: Ovidio Kin, MD;  Location: [facility];  Service: General;  Laterality: Right;   BREAST MASS EXCISION  1997   left   CHOLECYSTECTOMY  07/15/2011   Procedure: LAPAROSCOPIC CHOLECYSTECTOMY WITH INTRAOPERATIVE CHOLANGIOGRAM;  Surgeon: Kandis Cocking, MD;  Location: [facility];  Service: General;  Laterality: N/A;  Cholecystectomy with IOC   TOE FUSION     TUBAL LIGATION  1980   Family History  Problem Relation Age of Onset   Cancer Mother        stomach   Heart disease Father    Cancer Maternal Aunt        ovarian   Cancer Paternal Aunt        breast   Diabetes Brother    Hypertension Brother    Social History   Socioeconomic History   Marital status: Significant Other    Spouse name: Not on file   Number of children: 1   Years of education: 12   Highest education level: High school graduate  Occupational History   Occupation: Retired     Associate Professor: [employer]   Occupation: Retired    Comment: Comptroller  Tobacco Use   Smoking status: Some Days    Current packs/day: 0.00    Average packs/day: 0.3 packs/day for 44.6 years (11.1 ttl pk-yrs)    Types: Cigarettes    Start date: 09/30/1973    Last attempt to quit: 04/26/2018    Years since quitting: 4.9   Smokeless tobacco: Never   Tobacco comments:    a pack per month  Vaping Use   Vaping status: Never  Used  Substance and Sexual Activity   Alcohol use: No    Alcohol/week: 0.0 standard drinks of alcohol   Drug use: No   Sexual activity: Not on file  Other Topics Concern   Not on file  Social History Narrative   3 grandchildren. Enjoys dancing and going out to eat.    Social Drivers of Corporate investment banker Strain: Low Risk  (04/18/2023)   Overall Financial Resource Strain (CARDIA)    Difficulty of Paying Living Expenses: Not hard at all  Food Insecurity: No Food Insecurity (08/11/2022)   Received from [facility]  Hunger Vital Sign    Worried About Running Out of Food in the Last Year: Never true    Ran Out of Food in the Last Year: Never true  Transportation Needs: No Transportation Needs (08/11/2022)   Received from [facility] - Transportation    Lack of Transportation (Medical): No    Lack of Transportation (Non-Medical): No  Physical Activity: Sufficiently Active (04/18/2023)   Exercise Vital Sign    Days of Exercise per Week: 4 days    Minutes of Exercise per Session: 50 min  Stress: No Stress Concern Present (04/18/2023)   [employer] of Occupational Health - Occupational Stress Questionnaire    Feeling of Stress : Not at all  Social Connections: Moderately Integrated (04/18/2023)   Social Connection and Isolation Panel [NHANES]    Frequency of Communication with Friends and Family: More than three times a week    Frequency of Social Gatherings with Friends and Family: Twice a week    Attends Religious Services: More than 4 times per year    Active Member of [employer] or Organizations: Yes    Attends Banker Meetings: Never    Marital Status: Divorced    Tobacco Counseling Ready to quit: Not Answered Counseling given: Yes Tobacco comments: a pack per month    Clinical Intake:  Pre-visit preparation completed: Yes  Pain : 0-10 (break back in 11/25) Pain Score: 5  Pain Location: Back Pain Descriptors / Indicators: Aching,  Throbbing Pain Onset: More than a month ago Pain Frequency: Intermittent Pain Relieving Factors: take tylenol Effect of Pain on Daily Activities: yes  Pain Relieving Factors: take tylenol  BMI - recorded: 26.93 Nutritional Status: BMI 25 -29 Overweight Nutritional Risks: None Diabetes: No  Lab Results  Component Value Date   HGBA1C 5.4 03/06/2020     How often do you need to have someone help you when you read instructions, pamphlets, or other written materials from your doctor or pharmacy?: 1 - Never  Interpreter Needed?: No  Information entered by :: Zane Herald T/CMA   Activities of Daily Living     04/18/2023    1:31 PM  In your present state of health, do you have any difficulty performing the following activities:  Hearing? 0  Vision? 0  Difficulty concentrating or making decisions? 0  Walking or climbing stairs? 0  Dressing or bathing? 0  Doing errands, shopping? 0  Preparing Food and eating ? N  Using the Toilet? N  In the past six months, have you accidently leaked urine? N  Do you have problems with loss of bowel control? Y  Managing your Medications? N  Managing your Finances? N  Housekeeping or managing your Housekeeping? N    Patient Care Team: Junie Spencer, FNP as PCP - General (Family Medicine) Remus Loffler, PA-C as Referring Physician (General Practice) Eldred Manges, MD as Consulting Physician (Orthopedic Surgery) Pershing Proud, RN as Oncology Nurse Navigator Donnelly Angelica, RN as Oncology Nurse Navigator Stanford Breed, MD as Consulting Physician (Radiation Oncology) Edwyna Shell, MD as Referring Physician (Hematology and Oncology) Ernestina Penna, MD as Referring Physician (Obstetrics and Gynecology) Shelly Coss, MD as Referring Physician (Surgery) Adam Phenix, DPM as Consulting Physician (Podiatry)  Indicate any recent Medical Services you may have received from other than [facility] providers in the past year (date may be  approximate).     Assessment:   This is a routine wellness examination for Glorian.  Hearing/Vision  screen Hearing Screening - Comments:: Pt denies hearing def  Vision Screening - Comments:: Pt denies vision def [facility] [address] [facility] [place]   Goals Addressed             This Visit's Progress    Patient Stated       Contiue to loose wt, had loss 22lbs. Like to loose more       Depression Screen     04/18/2023    1:29 PM 01/31/2023    8:58 AM 01/17/2023    9:05 AM 09/17/2022    8:13 AM 09/07/2022    9:20 AM 06/24/2022   10:11 AM 06/16/2022    4:04 PM  PHQ 2/9 Scores  PHQ - 2 Score 0 0 0 0 0 0 0  PHQ- 9 Score 1 0 0 0  0 0    Fall Risk     04/18/2023    1:28 PM 01/31/2023    8:58 AM 09/07/2022    9:20 AM 06/24/2022   10:11 AM 04/12/2022   10:51 AM  Fall Risk   Falls in the past year? 0 0 1 0 0  Number falls in past yr: 0 0 0  0  Injury with Fall? 1 0 0  0  Risk for fall due to : No Fall Risks;Impaired balance/gait;Impaired mobility No Fall Risks   No Fall Risks  Follow up Falls prevention discussed;Falls evaluation completed Falls evaluation completed;Education provided Falls prevention discussed  Falls prevention discussed;Education provided;Falls evaluation completed    MEDICARE RISK AT HOME:  Medicare Risk at Home Any stairs in or around the home?: No If so, are there any without handrails?: No Home free of loose throw rugs in walkways, pet beds, electrical cords, etc?: Yes Adequate lighting in your home to reduce risk of falls?: Yes Life alert?: No Use of a cane, walker or w/c?: No Grab bars in the bathroom?: Yes Shower chair or bench in shower?: Yes Elevated toilet seat or a handicapped toilet?: Yes  TIMED UP AND GO:  Was the test performed?  No  Cognitive Function: 6CIT completed    02/07/2018    4:41 PM  MMSE - Mini Mental State Exam  Orientation to time 5  Orientation to Place 5  Registration 3  Attention/ Calculation 5  Recall 2  Language- name 2  objects 2  Language- repeat 1  Language- follow 3 step command 3  Language- read & follow direction 1  Write a sentence 1  Copy design 1  Total score 29        04/18/2023    1:38 PM 04/12/2022   10:55 AM 03/17/2021    8:33 AM 03/14/2020    8:22 AM 03/13/2019    8:54 AM  6CIT Screen  What Year? 0 points 0 points 0 points 0 points 0 points  What month? 0 points 0 points 0 points 0 points 0 points  What time? 0 points 0 points 0 points 0 points 0 points  Count back from 20 0 points 0 points 0 points 0 points 0 points  Months in reverse 0 points 0 points 0 points 0 points 0 points  Repeat phrase 0 points 0 points 2 points 0 points 0 points  Total Score 0 points 0 points 2 points 0 points 0 points    Immunizations Immunization History  Administered Date(s) Administered   Fluad Quad(high Dose 65+) 10/26/2019, 10/26/2019, 11/03/2020, 12/18/2021   Fluad Trivalent(High Dose 65+) 11/17/2022   Influenza Inj Mdck  Quad Pf 10/19/2016   Influenza, High Dose Seasonal PF 11/07/2017, 11/07/2017, 10/09/2018   Influenza-Unspecified 10/30/2015   Moderna Sars-Covid-2 Vaccination 02/21/2019, 03/22/2019, 12/11/2019   Pneumococcal Conjugate-13 11/07/2017   Pneumococcal Polysaccharide-23 06/26/2019   Tdap 02/19/2020   Zoster Recombinant(Shingrix) 08/27/2020, 01/27/2021   Zoster, Live 10/29/2014    Screening Tests Health Maintenance  Topic Date Due   COVID-19 Vaccine (4 - 2024-25 season) 05/03/2024 (Originally 09/19/2022)   MAMMOGRAM  06/17/2023   DEXA SCAN  08/26/2023   Medicare Annual Wellness (AWV)  04/17/2024   DTaP/Tdap/Td (2 - Td or Tdap) 02/18/2030   Colonoscopy  12/30/2030   Pneumonia Vaccine 68+ Years old  Completed   INFLUENZA VACCINE  Completed   Hepatitis C Screening  Completed   Zoster Vaccines- Shingrix  Completed   HPV VACCINES  Aged Out    Health Maintenance  There are no preventive care reminders to display for this patient.  Health Maintenance Items Addressed: See Nurse  Notes  Additional Screening:  Vision Screening: Recommended annual ophthalmology exams for early detection of glaucoma and other disorders of the eye.  Dental Screening: Recommended annual dental exams for proper oral hygiene  Community Resource Referral / Chronic Care Management: CRR required this visit?  No   CCM required this visit?  No     Plan:     I have personally reviewed and noted the following in the patient's chart:   Medical and social history Use of alcohol, tobacco or illicit drugs  Current medications and supplements including opioid prescriptions. Patient is not currently taking opioid prescriptions. Functional ability and status Nutritional status Physical activity Advanced directives List of other physicians Hospitalizations, surgeries, and ER visits in previous 12 months Vitals Screenings to include cognitive, depression, and falls Referrals and appointments  In addition, I have reviewed and discussed with patient certain preventive protocols, quality metrics, and best practice recommendations. A written personalized care plan for preventive services as well as general preventive health recommendations were provided to patient.     Arta Silence, CMA   04/18/2023   After Visit Summary: (MyChart) Due to this being a telephonic visit, the after visit summary with patients personalized plan was offered to patient via MyChart   Notes: Nothing significant to report at this time.

## 2023-04-19 DIAGNOSIS — S22050A Wedge compression fracture of T5-T6 vertebra, initial encounter for closed fracture: Secondary | ICD-10-CM | POA: Diagnosis not present

## 2023-05-03 ENCOUNTER — Ambulatory Visit (INDEPENDENT_AMBULATORY_CARE_PROVIDER_SITE_OTHER)

## 2023-05-03 DIAGNOSIS — E538 Deficiency of other specified B group vitamins: Secondary | ICD-10-CM

## 2023-05-03 NOTE — Progress Notes (Signed)
 Patient is in office today for a nurse visit for B12 Injection. Patient Injection was given in the  Left deltoid. Patient tolerated injection well.

## 2023-05-09 ENCOUNTER — Ambulatory Visit: Payer: Self-pay

## 2023-05-09 NOTE — Telephone Encounter (Signed)
 Appointment scheduled. Noted.

## 2023-05-09 NOTE — Telephone Encounter (Signed)
 Chief Complaint: poison oak Symptoms: rash, itching  Frequency: since Saturday  Pertinent Negatives: Patient denies fever, rash to her mouth, genitals, eyes, difficulty breathing, fever, drainage Disposition: [] ED /[] Urgent Care (no appt availability in office) / [x] Appointment(In office/virtual)/ []  [place] [facility]/ [] Home Care/ [] Refused Recommended Disposition /[] [place] [facility]/ []  Follow-up with PCP Additional Notes: Pt reports she got poison oak on Saturday after mowing. Pt reports the rash is worse on the inside of her L arm and looks like "little bumps." Pt endorses 9/10 itching, denies presence of drainage. Pt states rash is spreading to her R arm as well. Pt denies fever, difficulty breathing, or a rash to her face, eyes, mouth, or genitals. Pt states she gets poison oak every year. RN scheduled pt for tomorrow at 1435. RN advised pt if she rash spreads to her genitals, face, eyes, mouth, or if she has difficulty breathing or fever she needs to go to the ED. Pt verbalized understanding.   Copied from CRM [id number]. Topic: Clinical - Red Word Triage >> May 09, 2023  2:23 PM Juluis Ok wrote: [facility] that prompted transfer to Nurse Triage: poison oak- b/l arms Reason for Disposition  MODERATE to SEVERE itching (e.g., interferes with work, school, sleep, or other activities)  Answer Assessment - Initial Assessment Questions 1. APPEARANCE of RASH: "Describe the rash."      "Little bumps like poison oak", no drainage, 9/10 itching 2. LOCATION: "Where is the rash located?"  (e.g., face, genitals, hands, legs)     Inside of L arm, using cream that is not working. Some places on the R arm as well 3. SIZE: "How large is the rash?"      Inside of L arm to elbow to wrist 4. ONSET: "When did the rash begin?"      Saturday night after mowing 5. ITCHING: "Does the rash itch?" If Yes, ask: "How bad is it?"   - MILD - doesn't interfere with normal activities   - MODERATE-SEVERE:  interferes with work, school, sleep, or other activities      Severe  6. EXPOSURE:  "How were you exposed to the plant (poison ivy, poison oak, sumac)"  "When were you exposed?"       Mowing on Saturday 7. PAST HISTORY: "Have you had a poison ivy rash before?" If Yes, ask: "How bad was it?"     Every year   Sun poisoning to the tops of her arms  Protocols used: Poison Ivy - Oak - [place]

## 2023-05-10 ENCOUNTER — Encounter: Payer: Self-pay | Admitting: Family Medicine

## 2023-05-10 ENCOUNTER — Ambulatory Visit: Admitting: Family Medicine

## 2023-05-10 VITALS — BP 96/60 | HR 75 | Temp 98.5°F | Ht 63.0 in | Wt 142.0 lb

## 2023-05-10 DIAGNOSIS — L237 Allergic contact dermatitis due to plants, except food: Secondary | ICD-10-CM

## 2023-05-10 MED ORDER — METHYLPREDNISOLONE ACETATE 40 MG/ML IJ SUSP
40.0000 mg | Freq: Once | INTRAMUSCULAR | Status: AC
  Administered 2023-05-10: 40 mg via INTRAMUSCULAR

## 2023-05-10 MED ORDER — TRIAMCINOLONE ACETONIDE 0.1 % EX CREA: 1.0000 | TOPICAL_CREAM | Freq: Two times a day (BID) | CUTANEOUS | 0 refills | Status: AC

## 2023-05-10 NOTE — Progress Notes (Signed)
 Subjective: CC: Rash PCP: Yevette Hem, FNP [id number] Jaclyn Schmidt is a 73 y.o. female presenting to clinic today for:  1.  Rash She reports that she developed a rash on her left upper extremity after she was doing some yard work this weekend.  She reports is diffusely itchy but she had some leftover triamcinolone  ointment that she is been applying that is been helping.  Is affecting her left upper extremity, she thinks she may have some on her neck and face as well.   ROS: Per HPI  Allergies  Allergen Reactions   Penicillins Rash and Other (See Comments)    PATIENT HAS HAD A PCN REACTION WITH IMMEDIATE RASH, FACIAL/TONGUE/THROAT SWELLING, SOB, OR LIGHTHEADEDNESS WITH HYPOTENSION:  #  #  #  YES  #  #  #   Has patient had a PCN reaction causing severe rash involving mucus membranes or skin necrosis: No Has patient had a PCN reaction that required hospitalization: No Has patient had a PCN reaction occurring within the last 10 years: No If all of the above answers are "NO", then may proceed with Cephalosporin use.    Doxycycline  Rash   Past Medical History:  Diagnosis Date   Anxiety    Breast cancer (HCC) 05/2018   Right Breast Cancer   Cancer (HCC) 04/2018   right breast cancer   Cholelithiasis    Depression    GERD (gastroesophageal reflux disease)    History of kidney stones    Hyperlipidemia    Hypothyroidism    Irritable bowel syndrome    Personal history of radiation therapy 2020   Right Breast Cancer   Pneumonia    Thyroid  disease    Vitamin D  deficiency     Current Outpatient Medications:    ALPRAZolam  (XANAX ) 0.5 MG tablet, Take 1 tablet (0.5 mg total) by mouth at bedtime as needed for anxiety., Disp: 20 tablet, Rfl: 2   anastrozole (ARIMIDEX) 1 MG tablet, Take 1 tablet by mouth daily., Disp: , Rfl:    baclofen  (LIORESAL ) 10 MG tablet, Take 1 tablet (10 mg total) by mouth 3 (three) times daily., Disp: 30 each, Rfl: 0   BIOTIN PO, Take 2 tablets by mouth  daily., Disp: , Rfl:    calcium  carbonate (TUMS - DOSED IN MG ELEMENTAL CALCIUM ) 500 MG chewable tablet, Chew 2 tablets by mouth daily., Disp: , Rfl:    cetirizine  (ZYRTEC  ALLERGY) 10 MG tablet, Take 1 tablet (10 mg total) by mouth daily., Disp: 90 tablet, Rfl: 1   Cholecalciferol (VITAMIN D3 PO), Take 4,000 Int'l Units by mouth daily., Disp: , Rfl:    fluticasone  (FLONASE ) 50 MCG/ACT nasal spray, Place 2 sprays into both nostrils daily as needed (FOR ALLERGIES.)., Disp: 48 mL, Rfl: 1   gabapentin  (NEURONTIN ) 300 MG capsule, TAKE TWO CAPSULES TWICE DAILY, Disp: 360 capsule, Rfl: 0   levothyroxine  (SYNTHROID ) 88 MCG tablet, Take 1 tablet (88 mcg total) by mouth daily., Disp: 90 tablet, Rfl: 1   meclizine  (ANTIVERT ) 25 MG tablet, TAKE 1 TABLET BY MOUTH 3 TIMES A DAY AS NEEDED FOR DIZZINESS, Disp: 60 tablet, Rfl: 3   meloxicam  (MOBIC ) 15 MG tablet, Take 1 tablet by mouth once daily, Disp: 30 tablet, Rfl: 1   oxyCODONE  (ROXICODONE ) 5 MG immediate release tablet, Take 1 tablet (5 mg total) by mouth every 6 (six) hours as needed for severe pain (pain score 7-10)., Disp: 10 tablet, Rfl: 0   pravastatin  (PRAVACHOL ) 40 MG tablet, TAKE 1 TABLET  BY MOUTH EVERY DAY, Disp: 90 tablet, Rfl: 1   SEMAGLUTIDE, 1 MG/DOSE, [place], Inject into the skin., Disp: , Rfl:    sertraline  (ZOLOFT ) 100 MG tablet, TAKE 1 TABLET BY MOUTH EVERY DAY, Disp: 90 tablet, Rfl: 0   triamcinolone  ointment (KENALOG ) 0.5 %, Apply 1 Application topically 2 (two) times daily., Disp: 30 g, Rfl: 0  Current Facility-Administered Medications:    cyanocobalamin  ((VITAMIN B-12)) injection 1,000 mcg, 1,000 mcg, Intramuscular, Q30 days, Tommas Fragmin A, FNP, 1,000 mcg at 05/03/23 3086 Social History   Socioeconomic History   Marital status: Significant Other    Spouse name: Not on file   Number of children: 1   Years of education: 12   Highest education level: High school graduate  Occupational History   Occupation: Retired     Associate Professor: [employer]   Occupation: Retired    Comment: Comptroller  Tobacco Use   Smoking status: Some Days    Current packs/day: 0.00    Average packs/day: 0.3 packs/day for 44.6 years (11.1 ttl pk-yrs)    Types: Cigarettes    Start date: 09/30/1973    Last attempt to quit: 04/26/2018    Years since quitting: 5.0   Smokeless tobacco: Never   Tobacco comments:    a pack per month  Vaping Use   Vaping status: Never Used  Substance and Sexual Activity   Alcohol use: No    Alcohol/week: 0.0 standard drinks of alcohol   Drug use: No   Sexual activity: Not on file  Other Topics Concern   Not on file  Social History Narrative   3 grandchildren. Enjoys dancing and going out to eat.    Social Drivers of Corporate investment banker Strain: Low Risk  (04/18/2023)   Overall Financial Resource Strain (CARDIA)    Difficulty of Paying Living Expenses: Not hard at all  Food Insecurity: No Food Insecurity (08/11/2022)   Received from [facility]   Hunger Vital Sign    Worried About Running Out of Food in the Last Year: Never true    Ran Out of Food in the Last Year: Never true  Transportation Needs: No Transportation Needs (08/11/2022)   Received from [facility] - Transportation    Lack of Transportation (Medical): No    Lack of Transportation (Non-Medical): No  Physical Activity: Sufficiently Active (04/18/2023)   Exercise Vital Sign    Days of Exercise per Week: 4 days    Minutes of Exercise per Session: 50 min  Stress: No Stress Concern Present (04/18/2023)   [employer] of Occupational Health - Occupational Stress Questionnaire    Feeling of Stress : Not at all  Social Connections: Moderately Integrated (04/18/2023)   Social Connection and Isolation Panel [NHANES]    Frequency of Communication with Friends and Family: More than three times a week    Frequency of Social Gatherings with Friends and Family: Twice a week    Attends Religious Services: More than 4 times per year    Active  Member of [employer] or Organizations: Yes    Attends Banker Meetings: Never    Marital Status: Divorced  Catering manager Violence: Not At Risk (04/12/2022)   Humiliation, Afraid, Rape, and Kick questionnaire    Fear of Current or Ex-Partner: No    Emotionally Abused: No    Physically Abused: No    Sexually Abused: No   Family History  Problem Relation Age of Onset   Cancer Mother  stomach   Heart disease Father    Cancer Maternal Aunt        ovarian   Cancer Paternal Aunt        breast   Diabetes Brother    Hypertension Brother     Objective: Office vital signs reviewed. BP 96/60   Pulse 75   Temp 98.5 F (36.9 C)   Ht 5\' 3"  (1.6 m)   Wt 142 lb (64.4 kg)   SpO2 94%   BMI 25.15 kg/m   Physical Examination:  General: Awake, alert, well nourished, No acute distress Skin: Minimally erythematous maculopapular rash along the left forearm, left lateral shoulder and underneath the left eye  Assessment/ Plan: 73 y.o. female   Allergic dermatitis due to poison oak - Plan: methylPREDNISolone  acetate (DEPO-MEDROL ) injection 40 mg, triamcinolone  cream (KENALOG ) 0.1 %  Depo-Medrol  administered.  Topical triamcinolone  sent.  Continue current regimen and follow-up as needed   Eliodoro Guerin, DO [facility] [phone]

## 2023-05-30 ENCOUNTER — Other Ambulatory Visit: Payer: Self-pay | Admitting: Family

## 2023-06-03 ENCOUNTER — Other Ambulatory Visit: Payer: Self-pay | Admitting: Family

## 2023-06-03 ENCOUNTER — Ambulatory Visit

## 2023-06-03 DIAGNOSIS — F339 Major depressive disorder, recurrent, unspecified: Secondary | ICD-10-CM

## 2023-06-03 DIAGNOSIS — F411 Generalized anxiety disorder: Secondary | ICD-10-CM

## 2023-06-10 ENCOUNTER — Other Ambulatory Visit: Payer: Self-pay | Admitting: Family

## 2023-06-17 ENCOUNTER — Ambulatory Visit: Payer: Medicare HMO | Admitting: Family

## 2023-06-17 ENCOUNTER — Encounter: Payer: Self-pay | Admitting: Family

## 2023-06-17 VITALS — BP 117/73 | HR 71 | Temp 97.3°F | Ht 63.0 in | Wt 139.0 lb

## 2023-06-17 DIAGNOSIS — J410 Simple chronic bronchitis: Secondary | ICD-10-CM | POA: Diagnosis not present

## 2023-06-17 DIAGNOSIS — F339 Major depressive disorder, recurrent, unspecified: Secondary | ICD-10-CM

## 2023-06-17 DIAGNOSIS — F411 Generalized anxiety disorder: Secondary | ICD-10-CM

## 2023-06-17 DIAGNOSIS — E785 Hyperlipidemia, unspecified: Secondary | ICD-10-CM

## 2023-06-17 DIAGNOSIS — M545 Low back pain, unspecified: Secondary | ICD-10-CM | POA: Diagnosis not present

## 2023-06-17 DIAGNOSIS — K6289 Other specified diseases of anus and rectum: Secondary | ICD-10-CM | POA: Diagnosis not present

## 2023-06-17 DIAGNOSIS — E039 Hypothyroidism, unspecified: Secondary | ICD-10-CM | POA: Diagnosis not present

## 2023-06-17 DIAGNOSIS — M546 Pain in thoracic spine: Secondary | ICD-10-CM

## 2023-06-17 DIAGNOSIS — R197 Diarrhea, unspecified: Secondary | ICD-10-CM

## 2023-06-17 DIAGNOSIS — Z853 Personal history of malignant neoplasm of breast: Secondary | ICD-10-CM

## 2023-06-17 DIAGNOSIS — E538 Deficiency of other specified B group vitamins: Secondary | ICD-10-CM | POA: Diagnosis not present

## 2023-06-17 DIAGNOSIS — G8929 Other chronic pain: Secondary | ICD-10-CM

## 2023-06-17 DIAGNOSIS — Z79899 Other long term (current) drug therapy: Secondary | ICD-10-CM | POA: Diagnosis not present

## 2023-06-17 DIAGNOSIS — M8580 Other specified disorders of bone density and structure, unspecified site: Secondary | ICD-10-CM | POA: Diagnosis not present

## 2023-06-17 MED ORDER — GABAPENTIN 300 MG PO CAPS: ORAL_CAPSULE | ORAL | 2 refills | Status: AC

## 2023-06-17 MED ORDER — COLESTIPOL HCL 5 G PO GRAN
5.0000 g | GRANULES | Freq: Two times a day (BID) | ORAL | 12 refills | Status: AC
Start: 2023-06-17 — End: ?

## 2023-06-17 MED ORDER — MECLIZINE HCL 25 MG PO TABS: ORAL_TABLET | ORAL | 3 refills | Status: AC

## 2023-06-17 MED ORDER — MELOXICAM 15 MG PO TABS
15.0000 mg | ORAL_TABLET | Freq: Every day | ORAL | 4 refills | Status: AC
Start: 2023-06-17 — End: ?

## 2023-06-17 MED ORDER — ALPRAZOLAM 0.5 MG PO TABS
0.5000 mg | ORAL_TABLET | Freq: Every evening | ORAL | 2 refills | Status: DC | PRN
Start: 2023-06-17 — End: 2023-09-20

## 2023-06-17 MED ORDER — PRAVASTATIN SODIUM 40 MG PO TABS: 40.0000 mg | ORAL_TABLET | Freq: Every day | ORAL | 1 refills | Status: DC

## 2023-06-17 MED ORDER — LEVOTHYROXINE SODIUM 88 MCG PO TABS: 88.0000 ug | ORAL_TABLET | Freq: Every day | ORAL | 1 refills | Status: DC

## 2023-06-17 MED ORDER — SERTRALINE HCL 100 MG PO TABS: 100.0000 mg | ORAL_TABLET | Freq: Every day | ORAL | 0 refills | Status: DC

## 2023-06-17 NOTE — Progress Notes (Signed)
 Subjective:    Patient ID: Jaclyn Schmidt, female    DOB: [birth year], 73 y.o.   MRN: [id number]  Chief Complaint  Patient presents with   Medical Management of Chronic Issues    WANTS AND RX FOR COLESTIPOL HYDROCHLORIDE 500 GRAM    PT presents to the office today for chronic follow up.   She is followed by Oncologists every 6 months for Right Breast cancer. She completed radiation 2020. States she is doing well at this time.   She has anal sphincter incompetence. She does pelvic floor exercises daily.    She also has Vit B 12 deficiency and gets monthly injections.    She has COPD and states her breathing is stable. She continues to smoke <1/2 pack.    She has osteopenia and had dexa scan 08/25/21. Takes Vit D and calcium  daily.  Thyroid  Problem Presents for follow-up visit. Symptoms include anxiety, diarrhea and dry skin. Patient reports no fatigue or hoarse voice. The symptoms have been stable.  Back Pain This is a chronic problem. The current episode started more than 1 year ago. The problem occurs intermittently. The problem has been waxing and waning since onset. The pain is present in the lumbar spine and thoracic spine. The quality of the pain is described as aching. The pain is at a severity of 3/10 (can be a 10 after doing work). The pain is moderate. The symptoms are aggravated by standing. Associated symptoms include leg pain. Risk factors include recent trauma. She has tried muscle relaxant and bed rest for the symptoms. The treatment provided mild relief.  Hyperlipidemia This is a chronic problem. The current episode started more than 1 year ago. The problem is uncontrolled. Recent lipid tests were reviewed and are high. Exacerbating diseases include obesity. Associated symptoms include leg pain. Current antihyperlipidemic treatment includes statins. The current treatment provides moderate improvement of lipids. Risk factors for coronary artery disease include dyslipidemia,  hypertension, a sedentary lifestyle and post-menopausal.  Anxiety Presents for follow-up visit. Symptoms include excessive worry, irritability, nervous/anxious behavior and restlessness. Symptoms occur occasionally. The severity of symptoms is mild.    Depression        This is a chronic problem.  The current episode started more than 1 year ago.   The problem occurs intermittently.  Associated symptoms include restlessness and sad.  Associated symptoms include no fatigue, no helplessness and no hopelessness.  Past treatments include SSRIs - Selective serotonin reuptake inhibitors.  Previous treatment provided moderate relief.  Past medical history includes thyroid  problem and anxiety.   Nicotine Dependence Presents for follow-up visit. Symptoms include irritability. Symptoms are negative for fatigue. The symptoms have been stable. She smokes < 1/2 a pack of cigarettes per day.      Review of Systems  Constitutional:  Positive for irritability. Negative for fatigue.  HENT:  Negative for hoarse voice.   Gastrointestinal:  Positive for diarrhea.  Musculoskeletal:  Positive for back pain.  Psychiatric/Behavioral:  The patient is nervous/anxious.   All other systems reviewed and are negative.  Family History  Problem Relation Age of Onset   Cancer Mother        stomach   Heart disease Father    Cancer Maternal Aunt        ovarian   Cancer Paternal Aunt        breast   Diabetes Brother    Hypertension Brother    Social History   Socioeconomic History   Marital status: Significant  Other    Spouse name: Not on file   Number of children: 1   Years of education: 12   Highest education level: High school graduate  Occupational History   Occupation: Retired     Associate Professor: [employer]   Occupation: Retired    Comment: Comptroller  Tobacco Use   Smoking status: Some Days    Current packs/day: 0.00    Average packs/day: 0.3 packs/day for 44.6 years (11.1 ttl pk-yrs)    Types: Cigarettes     Start date: 09/30/1973    Last attempt to quit: 04/26/2018    Years since quitting: 5.1   Smokeless tobacco: Never   Tobacco comments:    a pack per month  Vaping Use   Vaping status: Never Used  Substance and Sexual Activity   Alcohol use: No    Alcohol/week: 0.0 standard drinks of alcohol   Drug use: No   Sexual activity: Not on file  Other Topics Concern   Not on file  Social History Narrative   3 grandchildren. Enjoys dancing and going out to eat.    Social Drivers of Corporate investment banker Strain: Low Risk  (04/18/2023)   Overall Financial Resource Strain (CARDIA)    Difficulty of Paying Living Expenses: Not hard at all  Food Insecurity: No Food Insecurity (08/11/2022)   Received from [facility]   Hunger Vital Sign    Worried About Running Out of Food in the Last Year: Never true    Ran Out of Food in the Last Year: Never true  Transportation Needs: No Transportation Needs (08/11/2022)   Received from [facility] - Transportation    Lack of Transportation (Medical): No    Lack of Transportation (Non-Medical): No  Physical Activity: Sufficiently Active (04/18/2023)   Exercise Vital Sign    Days of Exercise per Week: 4 days    Minutes of Exercise per Session: 50 min  Stress: No Stress Concern Present (04/18/2023)   [employer] of Occupational Health - Occupational Stress Questionnaire    Feeling of Stress : Not at all  Social Connections: Moderately Integrated (04/18/2023)   Social Connection and Isolation Panel [NHANES]    Frequency of Communication with Friends and Family: More than three times a week    Frequency of Social Gatherings with Friends and Family: Twice a week    Attends Religious Services: More than 4 times per year    Active Member of [employer] or Organizations: Yes    Attends Banker Meetings: Never    Marital Status: Divorced        Objective:   Physical Exam Vitals reviewed.  Constitutional:      General: She  is not in acute distress.    Appearance: She is well-developed.  HENT:     Head: Normocephalic and atraumatic.     Right Ear: Tympanic membrane normal.     Left Ear: Tympanic membrane normal.  Eyes:     Pupils: Pupils are equal, round, and reactive to light.  Neck:     Thyroid : No thyromegaly.  Cardiovascular:     Rate and Rhythm: Normal rate and regular rhythm.     Heart sounds: Normal heart sounds. No murmur heard. Pulmonary:     Effort: Pulmonary effort is normal. No respiratory distress.     Breath sounds: Normal breath sounds. No wheezing.  Abdominal:     General: Bowel sounds are normal. There is no distension.     Palpations:  Abdomen is soft.     Tenderness: There is no abdominal tenderness.  Musculoskeletal:        General: Tenderness present.     Cervical back: Normal range of motion and neck supple.     Comments: Mild pain in lumbar with flexion and extension, back brace in place  Skin:    General: Skin is warm and dry.  Neurological:     Mental Status: She is alert and oriented to person, place, and time.     Cranial Nerves: No cranial nerve deficit.     Deep Tendon Reflexes: Reflexes are normal and symmetric.  Psychiatric:        Behavior: Behavior normal.        Thought Content: Thought content normal.        Judgment: Judgment normal.         BP 117/73   Pulse 71   Temp (!) 97.3 F (36.3 C)   Ht 5\' 3"  (1.6 m)   Wt 139 lb (63 kg)   SpO2 94%   BMI 24.62 kg/m   Assessment & Plan:   Jaclyn Schmidt comes in today with chief complaint of Medical Management of Chronic Issues (WANTS AND RX FOR COLESTIPOL  HYDROCHLORIDE 500 GRAM)   Diagnosis and orders addressed: 1. Controlled substance agreement signed - ALPRAZolam  (XANAX ) 0.5 MG tablet; Take 1 tablet (0.5 mg total) by mouth at bedtime as needed for anxiety.  Dispense: 20 tablet; Refill: 2  2. GAD (generalized anxiety disorder) - ALPRAZolam  (XANAX ) 0.5 MG tablet; Take 1 tablet (0.5 mg total) by mouth at  bedtime as needed for anxiety.  Dispense: 20 tablet; Refill: 2 - sertraline  (ZOLOFT ) 100 MG tablet; Take 1 tablet (100 mg total) by mouth daily.  Dispense: 90 tablet; Refill: 0  3. Hypothyroidism, unspecified type - levothyroxine  (SYNTHROID ) 88 MCG tablet; Take 1 tablet (88 mcg total) by mouth daily.  Dispense: 90 tablet; Refill: 1  4. Hyperlipidemia, unspecified hyperlipidemia type - pravastatin  (PRAVACHOL ) 40 MG tablet; Take 1 tablet (40 mg total) by mouth daily.  Dispense: 90 tablet; Refill: 1  5. Depression, recurrent (HCC) - sertraline  (ZOLOFT ) 100 MG tablet; Take 1 tablet (100 mg total) by mouth daily.  Dispense: 90 tablet; Refill: 0  6. Chronic midline thoracic back pain (Primary)  - gabapentin  (NEURONTIN ) 300 MG capsule; TAKE TWO CAPSULES TWICE DAILY  Dispense: 360 capsule; Refill: 2 - meloxicam  (MOBIC ) 15 MG tablet; Take 1 tablet (15 mg total) by mouth daily.  Dispense: 90 tablet; Refill: 4  7. Lumbar pain - meloxicam  (MOBIC ) 15 MG tablet; Take 1 tablet (15 mg total) by mouth daily.  Dispense: 90 tablet; Refill: 4  8. Anal sphincter incompetence - colestipol  (COLESTID ) 5 g granules; Take 5 g by mouth 2 (two) times daily.  Dispense: 500 g; Refill: 12  9. Osteopenia, unspecified location  10. Simple chronic bronchitis (HCC)  11. Vitamin B 12 deficiency  12. History of breast cancer  13. Diarrhea, unspecified type - colestipol  (COLESTID ) 5 g granules; Take 5 g by mouth 2 (two) times daily.  Dispense: 500 g; Refill: 12  Labs pending Will start colestid  to see if helps with diarrhea Patient reviewed in [place] controlled database, no flags noted. Contract and drug screen up dated today. Continue current medications  Health Maintenance reviewed Diet and exercise encouraged  Follow up plan: 3 months    Tommas Fragmin, FNP

## 2023-06-17 NOTE — Patient Instructions (Signed)

## 2023-06-30 DIAGNOSIS — Z1231 Encounter for screening mammogram for malignant neoplasm of breast: Secondary | ICD-10-CM | POA: Diagnosis not present

## 2023-07-11 DIAGNOSIS — R928 Other abnormal and inconclusive findings on diagnostic imaging of breast: Secondary | ICD-10-CM | POA: Diagnosis not present

## 2023-07-19 ENCOUNTER — Ambulatory Visit (INDEPENDENT_AMBULATORY_CARE_PROVIDER_SITE_OTHER)

## 2023-07-19 DIAGNOSIS — E538 Deficiency of other specified B group vitamins: Secondary | ICD-10-CM

## 2023-07-19 NOTE — Progress Notes (Signed)
 Patient is in office today for a nurse visit for B12 Injection. Patient Injection was given in the  Left deltoid. Patient tolerated injection well.

## 2023-08-25 DIAGNOSIS — M8589 Other specified disorders of bone density and structure, multiple sites: Secondary | ICD-10-CM | POA: Diagnosis not present

## 2023-08-25 DIAGNOSIS — Z170 Estrogen receptor positive status [ER+]: Secondary | ICD-10-CM | POA: Diagnosis not present

## 2023-08-25 DIAGNOSIS — J410 Simple chronic bronchitis: Secondary | ICD-10-CM | POA: Diagnosis not present

## 2023-08-25 DIAGNOSIS — E538 Deficiency of other specified B group vitamins: Secondary | ICD-10-CM | POA: Diagnosis not present

## 2023-08-25 DIAGNOSIS — C50511 Malignant neoplasm of lower-outer quadrant of right female breast: Secondary | ICD-10-CM | POA: Diagnosis not present

## 2023-08-25 DIAGNOSIS — D709 Neutropenia, unspecified: Secondary | ICD-10-CM | POA: Diagnosis not present

## 2023-08-26 ENCOUNTER — Ambulatory Visit (INDEPENDENT_AMBULATORY_CARE_PROVIDER_SITE_OTHER)

## 2023-08-26 DIAGNOSIS — E538 Deficiency of other specified B group vitamins: Secondary | ICD-10-CM | POA: Diagnosis not present

## 2023-08-26 NOTE — Progress Notes (Signed)
 Patient is in office today for a nurse visit for B12 Injection. Patient Injection was given in the  Left deltoid. Patient tolerated injection well.

## 2023-09-14 DIAGNOSIS — M79674 Pain in right toe(s): Secondary | ICD-10-CM | POA: Diagnosis not present

## 2023-09-14 DIAGNOSIS — L03032 Cellulitis of left toe: Secondary | ICD-10-CM | POA: Diagnosis not present

## 2023-09-14 DIAGNOSIS — M79675 Pain in left toe(s): Secondary | ICD-10-CM | POA: Diagnosis not present

## 2023-09-20 ENCOUNTER — Ambulatory Visit (INDEPENDENT_AMBULATORY_CARE_PROVIDER_SITE_OTHER): Admitting: Family

## 2023-09-20 ENCOUNTER — Encounter: Payer: Self-pay | Admitting: Family

## 2023-09-20 VITALS — BP 116/72 | HR 64 | Temp 97.8°F | Ht 63.0 in | Wt 138.0 lb

## 2023-09-20 DIAGNOSIS — E538 Deficiency of other specified B group vitamins: Secondary | ICD-10-CM | POA: Diagnosis not present

## 2023-09-20 DIAGNOSIS — M8580 Other specified disorders of bone density and structure, unspecified site: Secondary | ICD-10-CM

## 2023-09-20 DIAGNOSIS — M545 Low back pain, unspecified: Secondary | ICD-10-CM

## 2023-09-20 DIAGNOSIS — Z853 Personal history of malignant neoplasm of breast: Secondary | ICD-10-CM | POA: Diagnosis not present

## 2023-09-20 DIAGNOSIS — M546 Pain in thoracic spine: Secondary | ICD-10-CM

## 2023-09-20 DIAGNOSIS — J410 Simple chronic bronchitis: Secondary | ICD-10-CM

## 2023-09-20 DIAGNOSIS — F411 Generalized anxiety disorder: Secondary | ICD-10-CM | POA: Diagnosis not present

## 2023-09-20 DIAGNOSIS — G8929 Other chronic pain: Secondary | ICD-10-CM | POA: Diagnosis not present

## 2023-09-20 DIAGNOSIS — Z79899 Other long term (current) drug therapy: Secondary | ICD-10-CM | POA: Diagnosis not present

## 2023-09-20 DIAGNOSIS — F339 Major depressive disorder, recurrent, unspecified: Secondary | ICD-10-CM | POA: Diagnosis not present

## 2023-09-20 DIAGNOSIS — E039 Hypothyroidism, unspecified: Secondary | ICD-10-CM

## 2023-09-20 DIAGNOSIS — E785 Hyperlipidemia, unspecified: Secondary | ICD-10-CM

## 2023-09-20 LAB — CMP14+EGFR
ALT: 11 IU/L (ref 0–32)
AST: 16 IU/L (ref 0–40)
Albumin: 4.4 g/dL (ref 3.8–4.8)
Alkaline Phosphatase: 87 IU/L (ref 44–121)
BUN/Creatinine Ratio: 23 (ref 12–28)
BUN: 18 mg/dL (ref 8–27)
Bilirubin Total: 0.4 mg/dL (ref 0.0–1.2)
CO2: 22 mmol/L (ref 20–29)
Calcium: 9.5 mg/dL (ref 8.7–10.3)
Chloride: 104 mmol/L (ref 96–106)
Creatinine, Ser: 0.78 mg/dL (ref 0.57–1.00)
Globulin, Total: 2.2 g/dL (ref 1.5–4.5)
Glucose: 87 mg/dL (ref 70–99)
Potassium: 4.9 mmol/L (ref 3.5–5.2)
Sodium: 139 mmol/L (ref 134–144)
Total Protein: 6.6 g/dL (ref 6.0–8.5)
eGFR: 81 mL/min/1.73 (ref >59)

## 2023-09-20 MED ORDER — PRAVASTATIN SODIUM 40 MG PO TABS
40.0000 mg | ORAL_TABLET | Freq: Every day | ORAL | 1 refills | Status: AC
Start: 2023-09-20 — End: ?

## 2023-09-20 MED ORDER — CETIRIZINE HCL 10 MG PO TABS: 10.0000 mg | ORAL_TABLET | Freq: Every day | ORAL | 1 refills | Status: AC

## 2023-09-20 MED ORDER — LEVOTHYROXINE SODIUM 88 MCG PO TABS: 88.0000 ug | ORAL_TABLET | Freq: Every day | ORAL | 1 refills | Status: DC

## 2023-09-20 MED ORDER — ALPRAZOLAM 0.5 MG PO TABS: 0.5000 mg | ORAL_TABLET | Freq: Every evening | ORAL | 2 refills | Status: DC | PRN

## 2023-09-20 MED ORDER — SERTRALINE HCL 100 MG PO TABS: 100.0000 mg | ORAL_TABLET | Freq: Every day | ORAL | 0 refills | Status: DC

## 2023-09-20 NOTE — Progress Notes (Signed)
 Subjective:    Patient ID: Jaclyn Schmidt, female    DOB: 1950-03-09, 73 y.o.   MRN: [id number]  Chief Complaint  Patient presents with   Medical Management of Chronic Issues    PT presents to the office today for chronic follow up.   She is followed by Oncologists annually for hx right Breast cancer. She completed radiation 2020. States she is doing well at this time.   She has anal sphincter incompetence. She does pelvic floor exercises daily.    She also has Vit B 12 deficiency and gets monthly injections.    She has COPD and states her breathing is stable. She continues to smoke <1/2 pack.    She has osteopenia and had dexa scan 08/25/21. Takes Vit D and calcium  daily.  Thyroid  Problem Presents for follow-up visit. Symptoms include anxiety, diarrhea, dry skin and fatigue. Patient reports no hoarse voice. The symptoms have been stable.  Back Pain This is a chronic problem. The current episode started more than 1 year ago. The problem occurs intermittently. The problem has been waxing and waning since onset. The pain is present in the lumbar spine and thoracic spine. The quality of the pain is described as aching. The pain is at a severity of 7/10 (can be a 10 after doing work). The pain is moderate. The symptoms are aggravated by standing. Associated symptoms include leg pain. Risk factors include recent trauma. She has tried muscle relaxant and bed rest for the symptoms. The treatment provided mild relief.  Hyperlipidemia This is a chronic problem. The current episode started more than 1 year ago. The problem is uncontrolled. Recent lipid tests were reviewed and are high. Exacerbating diseases include obesity. Associated symptoms include leg pain. Current antihyperlipidemic treatment includes statins. The current treatment provides moderate improvement of lipids. Risk factors for coronary artery disease include dyslipidemia, hypertension, a sedentary lifestyle and post-menopausal.   Anxiety Presents for follow-up visit. Symptoms include excessive worry, irritability, nervous/anxious behavior and restlessness. Symptoms occur occasionally. The severity of symptoms is mild.    Depression        This is a chronic problem.  The current episode started more than 1 year ago.   The problem occurs intermittently.  Associated symptoms include fatigue, restlessness and sad.  Associated symptoms include no helplessness and no hopelessness.  Past treatments include SSRIs - Selective serotonin reuptake inhibitors.  Previous treatment provided moderate relief.  Past medical history includes thyroid  problem and anxiety.   Nicotine Dependence Presents for follow-up visit. Symptoms include fatigue and irritability. The symptoms have been stable. She smokes < 1/2 a pack of cigarettes per day.      Review of Systems  Constitutional:  Positive for fatigue and irritability.  HENT:  Negative for hoarse voice.   Gastrointestinal:  Positive for diarrhea.  Musculoskeletal:  Positive for back pain.  Psychiatric/Behavioral:  The patient is nervous/anxious.   All other systems reviewed and are negative.  Family History  Problem Relation Age of Onset   Cancer Mother        stomach   Heart disease Father    Cancer Maternal Aunt        ovarian   Cancer Paternal Aunt        breast   Diabetes Brother    Hypertension Brother    Social History   Socioeconomic History   Marital status: Significant Other    Spouse name: Not on file   Number of children: 1   Years  of education: 12   Highest education level: High school graduate  Occupational History   Occupation: Retired     Associate Professor: [employer]   Occupation: Retired    Comment: Comptroller  Tobacco Use   Smoking status: Some Days    Current packs/day: 0.00    Average packs/day: 0.3 packs/day for 44.6 years (11.1 ttl pk-yrs)    Types: Cigarettes    Start date: 09/30/1973    Last attempt to quit: 04/26/2018    Years since quitting: 5.4    Smokeless tobacco: Never   Tobacco comments:    a pack per month  Vaping Use   Vaping status: Never Used  Substance and Sexual Activity   Alcohol use: No    Alcohol/week: 0.0 standard drinks of alcohol   Drug use: No   Sexual activity: Not on file  Other Topics Concern   Not on file  Social History Narrative   3 grandchildren. Enjoys dancing and going out to eat.    Social Drivers of Corporate investment banker Strain: Low Risk  (04/18/2023)   Overall Financial Resource Strain (CARDIA)    Difficulty of Paying Living Expenses: Not hard at all  Food Insecurity: No Food Insecurity (08/11/2022)   [facility]   Hunger Vital Sign    Within the past 12 months, you worried that your food would run out before you got the money to buy more.: Never true    Within the past 12 months, the food you bought just didn't last and you didn't have money to get more.: Never true  Transportation Needs: No Transportation Needs (08/11/2022)   Received from [facility] - Transportation    Lack of Transportation (Medical): No    Lack of Transportation (Non-Medical): No  Physical Activity: Sufficiently Active (04/18/2023)   Exercise Vital Sign    Days of Exercise per Week: 4 days    Minutes of Exercise per Session: 50 min  Stress: No Stress Concern Present (04/18/2023)   [employer] of Occupational Health - Occupational Stress Questionnaire    Feeling of Stress : Not at all  Social Connections: Moderately Integrated (04/18/2023)   Social Connection and Isolation Panel    Frequency of Communication with Friends and Family: More than three times a week    Frequency of Social Gatherings with Friends and Family: Twice a week    Attends Religious Services: More than 4 times per year    Active Member of [employer] or Organizations: Yes    Attends Banker Meetings: Never    Marital Status: Divorced        Objective:   Physical Exam Vitals reviewed.   Constitutional:      General: She is not in acute distress.    Appearance: She is well-developed.  HENT:     Head: Normocephalic and atraumatic.     Right Ear: Tympanic membrane normal.     Left Ear: Tympanic membrane normal.  Eyes:     Pupils: Pupils are equal, round, and reactive to light.  Neck:     Thyroid : No thyromegaly.  Cardiovascular:     Rate and Rhythm: Normal rate and regular rhythm.     Heart sounds: Normal heart sounds. No murmur heard. Pulmonary:     Effort: Pulmonary effort is normal. No respiratory distress.     Breath sounds: Normal breath sounds. No wheezing.  Abdominal:     General: Bowel sounds are normal. There is no distension.  Palpations: Abdomen is soft.     Tenderness: There is no abdominal tenderness.  Musculoskeletal:        General: Tenderness present.     Cervical back: Normal range of motion and neck supple.     Comments: Mild pain in lumbar with flexion and extension, back brace in place  Skin:    General: Skin is warm and dry.  Neurological:     Mental Status: She is alert and oriented to person, place, and time.     Cranial Nerves: No cranial nerve deficit.     Deep Tendon Reflexes: Reflexes are normal and symmetric.  Psychiatric:        Behavior: Behavior normal.        Thought Content: Thought content normal.        Judgment: Judgment normal.         BP 116/72   Pulse 64   Temp 97.8 F (36.6 C) (Temporal)   Ht 5' 3 (1.6 m)   Wt 138 lb (62.6 kg)   SpO2 96%   BMI 24.45 kg/m   Assessment & Plan:   MADA SADIK comes in today with chief complaint of Medical Management of Chronic Issues   Diagnosis and orders addressed: 1. Controlled substance agreement signed - ToxASSURE Select 13 (MW), Urine - ALPRAZolam  (XANAX ) 0.5 MG tablet; Take 1 tablet (0.5 mg total) by mouth at bedtime as needed for anxiety.  Dispense: 20 tablet; Refill: 2 - CMP14+EGFR  2. GAD (generalized anxiety disorder) - ToxASSURE Select 13 (MW),  Urine - ALPRAZolam  (XANAX ) 0.5 MG tablet; Take 1 tablet (0.5 mg total) by mouth at bedtime as needed for anxiety.  Dispense: 20 tablet; Refill: 2 - sertraline  (ZOLOFT ) 100 MG tablet; Take 1 tablet (100 mg total) by mouth daily.  Dispense: 90 tablet; Refill: 0 - CMP14+EGFR  3. Hypothyroidism, unspecified type - levothyroxine  (SYNTHROID ) 88 MCG tablet; Take 1 tablet (88 mcg total) by mouth daily.  Dispense: 90 tablet; Refill: 1 - CMP14+EGFR  4. Hyperlipidemia, unspecified hyperlipidemia type - pravastatin  (PRAVACHOL ) 40 MG tablet; Take 1 tablet (40 mg total) by mouth daily.  Dispense: 90 tablet; Refill: 1 - CMP14+EGFR  5. Depression, recurrent (HCC) - sertraline  (ZOLOFT ) 100 MG tablet; Take 1 tablet (100 mg total) by mouth daily.  Dispense: 90 tablet; Refill: 0 - CMP14+EGFR  6. Chronic midline thoracic back pain (Primary) - CMP14+EGFR  7. Lumbar pain - CMP14+EGFR  8. Osteopenia, unspecified location - CMP14+EGFR - DG [facility] DEXA; Future  9. Vitamin B 12 deficiency - CMP14+EGFR  10. Simple chronic bronchitis (HCC) - CMP14+EGFR  11. History of breast cancer - CMP14+EGFR    Labs pending Patient reviewed in [place] controlled database, no flags noted. Contract and drug screen up dated today. Continue current medications  Health Maintenance reviewed Diet and exercise encouraged  Follow up plan: 3 months    Bari Learn, FNP

## 2023-09-20 NOTE — Patient Instructions (Signed)
 Health Maintenance After Age 73 After age 27, you are at a higher risk for certain long-term diseases and infections as well as injuries from falls. Falls are a major cause of broken bones and head injuries in people who are older than age 73. Getting regular preventive care can help to keep you healthy and well. Preventive care includes getting regular testing and making lifestyle changes as recommended by your health care provider. Talk with your health care provider about: Which screenings and tests you should have. A screening is a test that checks for a disease when you have no symptoms. A diet and exercise plan that is right for you. What should I know about screenings and tests to prevent falls? Screening and testing are the best ways to find a health problem early. Early diagnosis and treatment give you the best chance of managing medical conditions that are common after age 90. Certain conditions and lifestyle choices may make you more likely to have a fall. Your health care provider may recommend: Regular vision checks. Poor vision and conditions such as cataracts can make you more likely to have a fall. If you wear glasses, make sure to get your prescription updated if your vision changes. Medicine review. Work with your health care provider to regularly review all of the medicines you are taking, including over-the-counter medicines. Ask your health care provider about any side effects that may make you more likely to have a fall. Tell your health care provider if any medicines that you take make you feel dizzy or sleepy. Strength and balance checks. Your health care provider may recommend certain tests to check your strength and balance while standing, walking, or changing positions. Foot health exam. Foot pain and numbness, as well as not wearing proper footwear, can make you more likely to have a fall. Screenings, including: Osteoporosis screening. Osteoporosis is a condition that causes  the bones to get weaker and break more easily. Blood pressure screening. Blood pressure changes and medicines to control blood pressure can make you feel dizzy. Depression screening. You may be more likely to have a fall if you have a fear of falling, feel depressed, or feel unable to do activities that you used to do. Alcohol  use screening. Using too much alcohol  can affect your balance and may make you more likely to have a fall. Follow these instructions at home: Lifestyle Do not drink alcohol  if: Your health care provider tells you not to drink. If you drink alcohol : Limit how much you have to: 0-1 drink a day for women. 0-2 drinks a day for men. Know how much alcohol  is in your drink. In the [place] one drink equals one 12 oz bottle of beer (355 mL), one 5 oz glass of wine (148 mL), or one 1 oz glass of hard liquor (44 mL). Do not use any products that contain nicotine or tobacco. These products include cigarettes, chewing tobacco, and vaping devices, such as e-cigarettes. If you need help quitting, ask your health care provider. Activity  Follow a regular exercise program to stay fit. This will help you maintain your balance. Ask your health care provider what types of exercise are appropriate for you. If you need a cane or walker, use it as recommended by your health care provider. Wear supportive shoes that have nonskid soles. Safety  Remove any tripping hazards, such as rugs, cords, and clutter. Install safety equipment such as grab bars in bathrooms and safety rails on stairs. Keep rooms and walkways  well-lit. General instructions Talk with your health care provider about your risks for falling. Tell your health care provider if: You fall. Be sure to tell your health care provider about all falls, even ones that seem minor. You feel dizzy, tiredness (fatigue), or off-balance. Take over-the-counter and prescription medicines only as told by your health care provider. These include  supplements. Eat a healthy diet and maintain a healthy weight. A healthy diet includes low-fat dairy products, low-fat (lean) meats, and fiber from whole grains, beans, and lots of fruits and vegetables. Stay current with your vaccines. Schedule regular health, dental, and eye exams. Summary Having a healthy lifestyle and getting preventive care can help to protect your health and wellness after age 15. Screening and testing are the best way to find a health problem early and help you avoid having a fall. Early diagnosis and treatment give you the best chance for managing medical conditions that are more common for people who are older than age 42. Falls are a major cause of broken bones and head injuries in people who are older than age 64. Take precautions to prevent a fall at home. Work with your health care provider to learn what changes you can make to improve your health and wellness and to prevent falls. This information is not intended to replace advice given to you by your health care provider. Make sure you discuss any questions you have with your health care provider. Document Revised: 05/26/2020 Document Reviewed: 05/26/2020 Elsevier Patient Education  2024 [employer].

## 2023-09-22 ENCOUNTER — Ambulatory Visit: Payer: Self-pay | Admitting: Family

## 2023-09-22 DIAGNOSIS — M8580 Other specified disorders of bone density and structure, unspecified site: Secondary | ICD-10-CM

## 2023-09-22 LAB — TOXASSURE SELECT 13 (MW), URINE

## 2023-09-23 ENCOUNTER — Telehealth: Payer: Self-pay | Admitting: Family Medicine

## 2023-09-23 NOTE — Telephone Encounter (Signed)
 Copied from CRM #[id number]. Topic: Clinical - Lab/Test Results >> Sep 23, 2023 11:29 AM Willma SAUNDERS wrote: Reason for CRM: Patient called for lab work results. Relayed message, has additional questions.   Patient can be reached at [phone]

## 2023-09-23 NOTE — Telephone Encounter (Signed)
 Pt made aware.

## 2023-09-23 NOTE — Telephone Encounter (Signed)
 Thyroid  normal last draw, can wait til next visit.

## 2023-09-23 NOTE — Telephone Encounter (Signed)
 Pt made aware of results. Asked about her thyroid . It was last checked in Jan. Pt does have a f/u in 48m. Can she wait until the appt to have checked?

## 2023-09-26 ENCOUNTER — Ambulatory Visit (INDEPENDENT_AMBULATORY_CARE_PROVIDER_SITE_OTHER)

## 2023-09-26 ENCOUNTER — Other Ambulatory Visit

## 2023-09-26 DIAGNOSIS — E538 Deficiency of other specified B group vitamins: Secondary | ICD-10-CM | POA: Diagnosis not present

## 2023-09-26 DIAGNOSIS — M8588 Other specified disorders of bone density and structure, other site: Secondary | ICD-10-CM

## 2023-09-26 DIAGNOSIS — M8580 Other specified disorders of bone density and structure, unspecified site: Secondary | ICD-10-CM

## 2023-09-26 NOTE — Progress Notes (Signed)
 Patient is in office today for a nurse visit for B12 Injection. Patient Injection was given in the  Left deltoid. Patient tolerated injection well.

## 2023-09-29 DIAGNOSIS — M8589 Other specified disorders of bone density and structure, multiple sites: Secondary | ICD-10-CM | POA: Diagnosis not present

## 2023-09-29 DIAGNOSIS — Z780 Asymptomatic menopausal state: Secondary | ICD-10-CM | POA: Diagnosis not present

## 2023-10-20 ENCOUNTER — Ambulatory Visit: Admitting: Family Medicine

## 2023-10-20 ENCOUNTER — Ambulatory Visit: Payer: Self-pay

## 2023-10-20 ENCOUNTER — Encounter: Payer: Self-pay | Admitting: Family Medicine

## 2023-10-20 VITALS — BP 134/69 | HR 66 | Temp 97.8°F | Ht 63.0 in | Wt 138.0 lb

## 2023-10-20 DIAGNOSIS — R300 Dysuria: Secondary | ICD-10-CM

## 2023-10-20 DIAGNOSIS — R102 Pelvic and perineal pain unspecified side: Secondary | ICD-10-CM

## 2023-10-20 LAB — URINALYSIS, ROUTINE W REFLEX MICROSCOPIC
Bilirubin, UA: NEGATIVE
Glucose, UA: NEGATIVE
Ketones, UA: NEGATIVE
Leukocytes,UA: NEGATIVE
Nitrite, UA: NEGATIVE
Protein,UA: NEGATIVE
Specific Gravity, UA: 1.02 (ref 1.005–1.030)
Urobilinogen, Ur: 0.2 mg/dL (ref 0.2–1.0)
pH, UA: 6 (ref 5.0–7.5)

## 2023-10-20 LAB — MICROSCOPIC EXAMINATION
Renal Epithel, UA: NONE SEEN /HPF
Yeast, UA: NONE SEEN

## 2023-10-20 LAB — WET PREP FOR TRICH, YEAST, CLUE
Clue Cell Exam: NEGATIVE
Trichomonas Exam: NEGATIVE
Yeast Exam: NEGATIVE

## 2023-10-20 MED ORDER — METHYLPREDNISOLONE 4 MG PO TBPK: ORAL_TABLET | ORAL | 0 refills | Status: DC

## 2023-10-20 NOTE — Telephone Encounter (Signed)
 FYI Only or Action Required?: FYI only for provider.  Patient was last seen in primary care on 09/20/2023 by Lavell Bari LABOR, FNP.  Called Nurse Triage reporting Vaginal Pain.  Symptoms began several days ago.  Interventions attempted: Nothing.  Symptoms are: unchanged.  Triage Disposition: See HCP Within 4 Hours (Or PCP Triage)  Patient/caregiver understands and will follow disposition?: Yes                      Copied from CRM #[id number]. Topic: Clinical - Red Word Triage >> Oct 20, 2023  8:10 AM Jaclyn Schmidt wrote: Red Word that prompted transfer to Nurse Triage: patient has vaginal pain, started Monday, not getting better. >> Oct 20, 2023  8:12 AM Jaclyn Schmidt wrote: Patient stated she is in the parking lot right now Reason for Disposition  [1] SEVERE vaginal pain AND [2] not improved 2 hours after pain medicine  Answer Assessment - Initial Assessment Questions 1. SYMPTOM: What's the main symptom you're concerned about? (Schmidt.g., pain, itching, dryness)     pain 2. LOCATION: Where is the  pain located? (Schmidt.g., inside/outside, left/right)     Vagina - she states st the hair line and below.  3. ONSET: When did the  pain  start?     Monday 4. PAIN: Is there any pain? If Yes, ask: How bad is it? (Scale: 1-10; mild, moderate, severe)     8-9/10 other times 5-6/10 5. ITCHING: Is there any itching? If Yes, ask: How bad is it? (Scale: 1-10; mild, moderate, severe)     no 6. CAUSE: What do you think is causing the discharge? Have you had the same problem before? What happened then?     No discharge 7. OTHER SYMPTOMS: Do you have any other symptoms? (Schmidt.g., fever, itching, vaginal bleeding, pain with urination, injury to genital area, vaginal foreign body)    She states that it feels like she needs to urinate and can't. She is able to urinate.  Protocols used: Vaginal Symptoms-A-AH

## 2023-10-20 NOTE — Telephone Encounter (Signed)
 Patient had appt today

## 2023-10-20 NOTE — Progress Notes (Signed)
 Subjective:  Patient ID: Jaclyn Schmidt, female    DOB: 1950/09/28  Age: 73 y.o. MRN: [id number]  CC: Vaginal Pain (Pelvic pressure/Urgency w/ little output)   HPI  Discussed the use of AI scribe software for clinical note transcription with the patient, who gave verbal consent to proceed.  History of Present Illness Jaclyn Schmidt is a 73 year old female who presents with pelvic pain.  She has been experiencing pelvic pain since Monday, which worsens with movement such as twisting, turning, getting in and out of the car, and changing positions. The pain is described as pressure and general hurting, and it is persistent even when she is not moving. The pain began after having sexual intercourse on Saturday night, and she is unsure if it is related.  A urine test showed a small amount of bacteria and white blood cells, but it was not conclusive for a significant infection. She has a history of a partial hysterectomy due to bladder issues.  She also reports chronic back pain, having broken her sixth vertebra in November after a fall. She experiences discomfort in her back regularly, but notes that the current pelvic pain is distinct from her usual back pain. No lower back pain in the flank area is reported.  She has been in a relationship with her boyfriend for over five years. No vaginal discharge is reported.          10/20/2023    8:58 AM 09/20/2023    8:44 AM 05/10/2023    2:32 PM  Depression screen PHQ 2/9  Decreased Interest 0 0 0  Down, Depressed, Hopeless 0 0 0  PHQ - 2 Score 0 0 0  Altered sleeping  0 0  Tired, decreased energy  0 0  Change in appetite  0 0  Feeling bad or failure about yourself   0 0  Trouble concentrating  0 0  Moving slowly or fidgety/restless  0 0  Suicidal thoughts  0 0  PHQ-9 Score  0 0  Difficult doing work/chores Not difficult at all Not difficult at all Not difficult at all    History Jaclyn Schmidt has a past medical history of Anxiety, Breast  cancer (HCC) (05/2018), Cancer (HCC) (04/2018), Cholelithiasis, Depression, GERD (gastroesophageal reflux disease), History of kidney stones, Hyperlipidemia, Hypothyroidism, Irritable bowel syndrome, Personal history of radiation therapy (2020), Pneumonia, Thyroid  disease, and Vitamin D  deficiency.   She has a past surgical history that includes Breast mass excision (1997); Appendectomy (1996); Tubal ligation (1980); Cholecystectomy (07/15/2011); Abdominal hysterectomy (97817984); Toe Fusion; Anterior cervical decomp/discectomy fusion (N/A, 01/21/2017); Breast lumpectomy with radioactive seed and sentinel lymph node biopsy (Right, 05/30/2018); and Breast lumpectomy (Right, 05/30/2018).   Her family history includes Cancer in her maternal aunt, mother, and paternal aunt; Diabetes in her brother; Heart disease in her father; Hypertension in her brother.She reports that she has been smoking cigarettes. She started smoking about 50 years ago. She has a 11.1 pack-year smoking history. She has never used smokeless tobacco. She reports that she does not drink alcohol and does not use drugs.    ROS Review of Systems  Constitutional: Negative.   HENT:  Negative for congestion.   Eyes:  Negative for visual disturbance.  Respiratory:  Negative for shortness of breath.   Cardiovascular:  Negative for chest pain.  Gastrointestinal:  Negative for abdominal pain, constipation, diarrhea, nausea and vomiting.  Genitourinary:  Positive for dysuria. Negative for flank pain.  Musculoskeletal:  Negative for arthralgias and myalgias.  Neurological:  Negative for headaches.  Psychiatric/Behavioral:  Negative for sleep disturbance.     Objective:  BP 134/69   Pulse 66   Temp 97.8 F (36.6 C)   Ht 5' 3 (1.6 m)   Wt 138 lb (62.6 kg)   SpO2 97%   BMI 24.45 kg/m   BP Readings from Last 3 Encounters:  10/20/23 134/69  09/20/23 116/72  06/17/23 117/73    Wt Readings from Last 3 Encounters:  10/20/23 138 lb  (62.6 kg)  09/20/23 138 lb (62.6 kg)  06/17/23 139 lb (63 kg)     Physical Exam Physical Exam GENERAL: Alert, cooperative, well developed, no acute distress HEENT: Normocephalic, normal oropharynx, moist mucous membranes CHEST: Clear to auscultation bilaterally, no wheezes, rhonchi, or crackles CARDIOVASCULAR: Normal heart rate and rhythm, S1 and S2 normal without murmurs ABDOMEN: Tenderness in lower abdomen, hard in lower region, non-distended, without organomegaly, normal bowel sounds EXTREMITIES: No cyanosis or edema NEUROLOGICAL: Cranial nerves grossly intact, moves all extremities without gross motor or sensory deficit   Assessment & Plan:  Pelvic pain -     WET PREP FOR TRICH, YEAST, CLUE  Dysuria -     Urinalysis, Routine w reflex microscopic -     Urine Culture -     WET PREP FOR TRICH, YEAST, CLUE  Other orders -     methylPREDNISolone ; Start with 6 on the first day and take one less each day until finished  Dispense: 21 tablet; Refill: 0 -     Microscopic Examination    Assessment and Plan Assessment & Plan Pelvic muscle strain   Pelvic muscle strain likely results from recent physical activity, causing pelvic pain and pressure worsened by movement since Monday. Initial urinalysis showed some bacteria and white blood cells but did not indicate a significant infection. Although a urinary tract infection was considered, a vaginal swab was ordered to rule out other causes. The pain is attributed to muscle strain from stretching and pulling muscles not recently used, considering her age and activity level. Order a vaginal swab for further investigation and send a urine culture to confirm the absence of infection. Prescribe a Medrol  pack to reduce inflammation. Advise rest and avoidance of physical activity that exacerbates symptoms. Instruct her to avoid sexual activity for the next week to allow healing.       Follow-up: No follow-ups on file.  Butler Der, M.D.

## 2023-10-22 LAB — URINE CULTURE

## 2023-10-23 ENCOUNTER — Other Ambulatory Visit: Payer: Self-pay | Admitting: Family Medicine

## 2023-10-23 ENCOUNTER — Ambulatory Visit: Payer: Self-pay | Admitting: Family Medicine

## 2023-10-23 MED ORDER — AZITHROMYCIN 250 MG PO TABS: ORAL_TABLET | ORAL | 0 refills | Status: DC

## 2023-10-27 ENCOUNTER — Ambulatory Visit

## 2023-10-27 DIAGNOSIS — E538 Deficiency of other specified B group vitamins: Secondary | ICD-10-CM | POA: Diagnosis not present

## 2023-10-27 NOTE — Progress Notes (Signed)
 B12 given in left deltoid without difficulty. Next months appt scheduled.

## 2023-10-31 DIAGNOSIS — L2089 Other atopic dermatitis: Secondary | ICD-10-CM | POA: Diagnosis not present

## 2023-10-31 DIAGNOSIS — L820 Inflamed seborrheic keratosis: Secondary | ICD-10-CM | POA: Diagnosis not present

## 2023-10-31 DIAGNOSIS — L648 Other androgenic alopecia: Secondary | ICD-10-CM | POA: Diagnosis not present

## 2023-11-23 DIAGNOSIS — H2513 Age-related nuclear cataract, bilateral: Secondary | ICD-10-CM | POA: Diagnosis not present

## 2023-11-23 DIAGNOSIS — H5203 Hypermetropia, bilateral: Secondary | ICD-10-CM | POA: Diagnosis not present

## 2023-11-23 DIAGNOSIS — H52223 Regular astigmatism, bilateral: Secondary | ICD-10-CM | POA: Diagnosis not present

## 2023-11-23 DIAGNOSIS — H524 Presbyopia: Secondary | ICD-10-CM | POA: Diagnosis not present

## 2023-11-24 ENCOUNTER — Ambulatory Visit (INDEPENDENT_AMBULATORY_CARE_PROVIDER_SITE_OTHER): Payer: Self-pay

## 2023-11-24 DIAGNOSIS — E538 Deficiency of other specified B group vitamins: Secondary | ICD-10-CM

## 2023-11-24 NOTE — Progress Notes (Signed)
 Patient is in office today for a nurse visit for B12 Injection. Patient Injection was given in the  Left deltoid. Patient tolerated injection well.

## 2023-12-07 DIAGNOSIS — H5203 Hypermetropia, bilateral: Secondary | ICD-10-CM | POA: Diagnosis not present

## 2023-12-26 ENCOUNTER — Ambulatory Visit

## 2023-12-29 ENCOUNTER — Encounter: Payer: Self-pay | Admitting: Family

## 2023-12-29 ENCOUNTER — Ambulatory Visit: Payer: Self-pay | Admitting: Family

## 2023-12-29 VITALS — BP 125/74 | HR 95 | Temp 96.9°F | Ht 63.0 in | Wt 139.0 lb

## 2023-12-29 DIAGNOSIS — Z853 Personal history of malignant neoplasm of breast: Secondary | ICD-10-CM

## 2023-12-29 DIAGNOSIS — Z79899 Other long term (current) drug therapy: Secondary | ICD-10-CM | POA: Diagnosis not present

## 2023-12-29 DIAGNOSIS — E538 Deficiency of other specified B group vitamins: Secondary | ICD-10-CM | POA: Diagnosis not present

## 2023-12-29 DIAGNOSIS — M8580 Other specified disorders of bone density and structure, unspecified site: Secondary | ICD-10-CM

## 2023-12-29 DIAGNOSIS — G8929 Other chronic pain: Secondary | ICD-10-CM

## 2023-12-29 DIAGNOSIS — F339 Major depressive disorder, recurrent, unspecified: Secondary | ICD-10-CM

## 2023-12-29 DIAGNOSIS — E785 Hyperlipidemia, unspecified: Secondary | ICD-10-CM

## 2023-12-29 DIAGNOSIS — F411 Generalized anxiety disorder: Secondary | ICD-10-CM

## 2023-12-29 DIAGNOSIS — M546 Pain in thoracic spine: Secondary | ICD-10-CM | POA: Diagnosis not present

## 2023-12-29 DIAGNOSIS — E039 Hypothyroidism, unspecified: Secondary | ICD-10-CM

## 2023-12-29 DIAGNOSIS — J410 Simple chronic bronchitis: Secondary | ICD-10-CM | POA: Diagnosis not present

## 2023-12-29 MED ORDER — ALPRAZOLAM 0.5 MG PO TABS: 0.5000 mg | ORAL_TABLET | Freq: Every evening | ORAL | 2 refills | Status: AC | PRN

## 2023-12-29 NOTE — Patient Instructions (Signed)
 Thoracic Strain Rehab Ask your health care provider which exercises are safe for you. Do exercises exactly as told by your provider and adjust them as directed. It is normal to feel mild stretching, pulling, tightness, or discomfort as you do these exercises. Stop right away if you feel sudden pain or your pain gets worse. Do not begin these exercises until told by your provider. Stretching and range-of-motion exercise This exercise warms up your muscles and joints and improves the movement and flexibility of your back and shoulders. This exercise also helps to relieve pain. Chest and spine stretch  Lie down on your back on a firm surface. Roll a towel or a small blanket so it is about 4 inches (10 cm) in diameter. Put the towel under the middle of your back so it is under your spine, but not under your shoulder blades. Put your hands behind your head and let your elbows fall to your sides. This will increase your stretch. Take a deep breath (inhale). Hold for __________ seconds. Relax after you breathe out (exhale). Repeat __________ times. Complete this exercise __________ times a day. Strengthening exercises These exercises build strength and endurance in your back and your shoulder blade muscles. Endurance is the ability to use your muscles for a long time, even after they get tired. Alternating arm and leg raises  Get on your hands and knees on a firm surface. If you are on a hard floor, you may want to use padding, such as an exercise mat, to cushion your knees. Line up your arms and legs. Your hands should be directly below your shoulders, and your knees should be directly below your hips. Lift your left leg behind you. At the same time, raise your right arm and straighten it in front of you. Do not lift your leg higher than your hip. Do not lift your arm higher than your shoulder. Keep your abdominal and back muscles tight. Keep your hips facing the ground. Do not arch your  back. Carefully stay balanced. Do not hold your breath. Hold for __________ seconds. Slowly return to the starting position and repeat with your right leg and your left arm. Repeat __________ times. Complete this exercise __________ times a day. Straight arm rows This exercise is also called the shoulder extension exercise. Stand with your feet shoulder width apart. Secure an exercise band to a stable object in front of you so the band is at or above shoulder height. Hold one end of the exercise band in each hand. Straighten your elbows and lift your hands up to shoulder height. Step back, away from the secured end of the exercise band, until the band stretches. Squeeze your shoulder blades together and pull your hands down to the sides of your thighs. Stop when your hands are straight down by your sides. This is shoulder extension. Do not let your hands go behind your body. Hold for __________ seconds. Slowly return to the starting position. Repeat __________ times. Complete this exercise __________ times a day. Rowing scapular retraction This is an exercise in which the shoulder blades (scapulae) are pulled toward each other (retraction). Sit in a stable chair without armrests, or stand up. Secure an exercise band to a stable object in front of you so the band is at shoulder height. Hold one end of the exercise band in each hand. Your palms should face toward each other. Bring your arms out straight in front of you. Step back, away from the secured end of the  exercise band, until the band stretches. Pull the band backward. As you do this, bend your elbows and squeeze your shoulder blades together, but avoid letting the rest of your body move. Do not shrug your shoulders upward while you do this. Stop when your elbows are at your sides or slightly behind your body. Hold for __________ seconds. Slowly straighten your arms to return to the starting position. Repeat __________ times.  Complete this exercise __________ times a day. Posture and body mechanics Good posture and healthy body mechanics can help to relieve stress in your body's tissues and joints. Body mechanics refers to the movements and positions of your body while you do your daily activities. Posture is part of body mechanics. Good posture means: Your spine is in its natural S-curve position (neutral). Your shoulders are pulled back slightly. Your head is not tipped forward. Follow these guidelines to improve your posture and body mechanics in your everyday activities. Standing  When standing, keep your spine neutral and your feet about hip width apart. Keep a slight bend in your knees. Your ears, shoulders, and hips should line up with each other. When you do a task in which you lean forward while standing in one place for a long time, place one foot up on a stable object that is 2-4 inches (5-10 cm) high, such as a footstool. This helps keep your spine neutral. Sitting  When sitting, keep your spine neutral and keep your feet flat on the floor. Use a footrest if needed. Keep your thighs parallel to the floor. Avoid rounding your shoulders, and avoid tilting your head forward. When working at a desk or a computer, keep your desk at a height where your hands are slightly lower than your elbows. Slide your chair under your desk so you are close enough to maintain good posture. When working at a computer, place your monitor at a height where you are looking straight ahead and you do not have to tilt your head forward or downward to look at the screen. Resting When lying down and resting, avoid positions that are most painful for you. If you have pain with activities such as sitting, bending, stooping, or squatting (flexion-basedactivities), lie in a position in which your body does not bend very much. For example, avoid curling up on your side with your arms and knees near your chest (fetal position). If you have  pain with activities such as standing for a long time or reaching with your arms (extension-basedactivities), lie with your spine in a neutral position and bend your knees slightly. Try the following positions: Lie on your side with a pillow between your knees. Lie on your back with a pillow under your knees.  Lifting  When lifting objects, keep your feet at least shoulder width apart and tighten your abdominal muscles. Bend your knees and hips and keep your spine neutral. It is important to lift using the strength of your legs, not your back. Do not lock your knees straight out. Always ask for help to lift heavy or awkward objects. This information is not intended to replace advice given to you by your health care provider. Make sure you discuss any questions you have with your health care provider. Document Revised: 06/18/2022 Document Reviewed: 08/24/2021 Elsevier Patient Education  2024 [employer].

## 2023-12-29 NOTE — Progress Notes (Signed)
 Subjective:    Patient ID: Jaclyn Schmidt, female    DOB: 06/15/1950, 73 y.o.   MRN: [id number]  Chief Complaint  Patient presents with   Medical Management of Chronic Issues    PT presents to the office today for chronic follow up.   She is followed by Oncologists annually for hx right Breast cancer. She completed radiation 2020. States she is doing well at this time.   She has anal sphincter incompetence. She does pelvic floor exercises daily.    She also has Vit B 12 deficiency and gets monthly injections.    She has COPD and states her breathing is stable. She continues to smoke <1/2 pack.    She has osteopenia and had dexa scan 09/30/23. Takes Vit D and calcium  daily.  Thyroid  Problem Presents for follow-up visit. Symptoms include anxiety and dry skin. Patient reports no diarrhea, fatigue or hoarse voice. The symptoms have been stable.  Back Pain This is a chronic problem. The current episode started more than 1 year ago. The problem occurs intermittently. The problem has been waxing and waning since onset. The pain is present in the thoracic spine. The quality of the pain is described as aching. The pain is at a severity of 9/10 (can be a 10 after doing work). The pain is moderate. The symptoms are aggravated by standing. Pertinent negatives include no leg pain. Risk factors include recent trauma. She has tried muscle relaxant and bed rest for the symptoms. The treatment provided mild relief.  Hyperlipidemia This is a chronic problem. The current episode started more than 1 year ago. The problem is uncontrolled. Recent lipid tests were reviewed and are high. Exacerbating diseases include obesity. Pertinent negatives include no leg pain. Current antihyperlipidemic treatment includes statins. The current treatment provides moderate improvement of lipids. Risk factors for coronary artery disease include dyslipidemia, hypertension, a sedentary lifestyle and post-menopausal.   Anxiety Presents for follow-up visit. Symptoms include excessive worry, irritability, nervous/anxious behavior and restlessness. Symptoms occur occasionally. The severity of symptoms is mild.    Depression        This is a chronic problem.  The current episode started more than 1 year ago.   The problem occurs intermittently.  Associated symptoms include restlessness.  Associated symptoms include no fatigue, no helplessness, no hopelessness and not sad.  Past treatments include SSRIs - Selective serotonin reuptake inhibitors.  Previous treatment provided moderate relief.  Past medical history includes thyroid  problem and anxiety.   Nicotine Dependence Presents for follow-up visit. Symptoms include irritability. Symptoms are negative for fatigue. The symptoms have been stable. She smokes < 1/2 a pack of cigarettes per day.      Review of Systems  Constitutional:  Positive for irritability. Negative for fatigue.  HENT:  Negative for hoarse voice.   Gastrointestinal:  Negative for diarrhea.  Musculoskeletal:  Positive for back pain.  Psychiatric/Behavioral:  The patient is nervous/anxious.   All other systems reviewed and are negative.  Family History  Problem Relation Age of Onset   Cancer Mother        stomach   Heart disease Father    Cancer Maternal Aunt        ovarian   Cancer Paternal Aunt        breast   Diabetes Brother    Hypertension Brother    Social History   Socioeconomic History   Marital status: Significant Other    Spouse name: Not on file   Number of  children: 1   Years of education: 12   Highest education level: High school graduate  Occupational History   Occupation: Retired     Associate Professor: [employer]   Occupation: Retired    Comment: comptroller  Tobacco Use   Smoking status: Some Days    Current packs/day: 0.00    Average packs/day: 0.3 packs/day for 44.6 years (11.1 ttl pk-yrs)    Types: Cigarettes    Start date: 09/30/1973    Last attempt to quit:  04/26/2018    Years since quitting: 5.6   Smokeless tobacco: Never   Tobacco comments:    a pack per month  Vaping Use   Vaping status: Never Used  Substance and Sexual Activity   Alcohol use: No    Alcohol/week: 0.0 standard drinks of alcohol   Drug use: No   Sexual activity: Not on file  Other Topics Concern   Not on file  Social History Narrative   3 grandchildren. Enjoys dancing and going out to eat.    Social Drivers of Health   Tobacco Use: High Risk (12/29/2023)   Patient History    Smoking Tobacco Use: Some Days    Smokeless Tobacco Use: Never    Passive Exposure: Not on file  Financial Resource Strain: Low Risk (04/18/2023)   Overall Financial Resource Strain (CARDIA)    Difficulty of Paying Living Expenses: Not hard at all  Food Insecurity: No Food Insecurity (08/11/2022)   Received from [facility]   Epic    Within the past 12 months, you worried that your food would run out before you got the money to buy more.: Never true    Within the past 12 months, the food you bought just didn't last and you didn't have money to get more.: Never true  Transportation Needs: No Transportation Needs (08/11/2022)   [facility] from [facility] - Transportation    Lack of Transportation (Medical): No    Lack of Transportation (Non-Medical): No  Physical Activity: Sufficiently Active (04/18/2023)   Exercise Vital Sign    Days of Exercise per Week: 4 days    Minutes of Exercise per Session: 50 min  Stress: No Stress Concern Present (04/18/2023)   [employer] of Occupational Health - Occupational Stress Questionnaire    Feeling of Stress : Not at all  Social Connections: Moderately Integrated (04/18/2023)   Social Connection and Isolation Panel    Frequency of Communication with Friends and Family: More than three times a week    Frequency of Social Gatherings with Friends and Family: Twice a week    Attends Religious Services: More than 4 times per year    Active  Member of Clubs or Organizations: Yes    Attends Banker Meetings: Never    Marital Status: Divorced  Depression (PHQ2-9): Low Risk (12/29/2023)   Depression (PHQ2-9)    PHQ-2 Score: 0  Alcohol Screen: Low Risk (04/18/2023)   Alcohol Screen    Last Alcohol Screening Score (AUDIT): 0  Housing: Low Risk (04/12/2022)   Housing    Last Housing Risk Score: 0  Utilities: Not At Risk (08/11/2022)   Received from [facility] Utilities    Threatened with loss of utilities: No  Health Literacy: Not on file        Objective:   Physical Exam Vitals reviewed.  Constitutional:      General: She is not in acute distress.    Appearance: She is well-developed.  HENT:     Head: Normocephalic and atraumatic.     Right Ear: Tympanic membrane normal.     Left Ear: Tympanic membrane normal.  Eyes:     Pupils: Pupils are equal, round, and reactive to light.  Neck:     Thyroid : No thyromegaly.  Cardiovascular:     Rate and Rhythm: Normal rate and regular rhythm.     Heart sounds: Normal heart sounds. No murmur heard. Pulmonary:     Effort: Pulmonary effort is normal. No respiratory distress.     Breath sounds: Normal breath sounds. No wheezing.  Abdominal:     General: Bowel sounds are normal. There is no distension.     Palpations: Abdomen is soft.     Tenderness: There is no abdominal tenderness.  Musculoskeletal:        General: Tenderness present.     Cervical back: Normal range of motion and neck supple.     Comments: Mild pain in thoracic  with flexion and extension, back brace in place  Skin:    General: Skin is warm and dry.  Neurological:     Mental Status: She is alert and oriented to person, place, and time.     Cranial Nerves: No cranial nerve deficit.     Deep Tendon Reflexes: Reflexes are normal and symmetric.  Psychiatric:        Behavior: Behavior normal.        Thought Content: Thought content normal.        Judgment: Judgment normal.          BP 125/74   Pulse 95   Temp (!) 96.9 F (36.1 C) (Temporal)   Ht 5' 3 (1.6 m)   Wt 139 lb (63 kg)   SpO2 97%   BMI 24.62 kg/m   Assessment & Plan:   SUGAR VANZANDT comes in today with chief complaint of Medical Management of Chronic Issues   Diagnosis and orders addressed: 1. Controlled substance agreement signed - ALPRAZolam  (XANAX ) 0.5 MG tablet; Take 1 tablet (0.5 mg total) by mouth at bedtime as needed for anxiety.  Dispense: 20 tablet; Refill: 2  2. GAD (generalized anxiety disorder) - ALPRAZolam  (XANAX ) 0.5 MG tablet; Take 1 tablet (0.5 mg total) by mouth at bedtime as needed for anxiety.  Dispense: 20 tablet; Refill: 2  3. Simple chronic bronchitis (HCC) (Primary) Smoking cessation discussed  4. Hypothyroidism, unspecified type Continue levothyroxine    5. Osteopenia, unspecified location Continue calcium  and vit d  6. Vitamin B 12 deficiency  7. Hyperlipidemia, unspecified hyperlipidemia type Low fat diet  8. History of breast cancer Keep oncologists   9. Depression, recurrent Continue Zoloft    10. Chronic midline thoracic back pain Continue Mobic  ROM exercises discussed, handout given    Labs pending Patient reviewed in [place] controlled database, no flags noted. Contract and drug screen up dated today. Continue current medications  Health Maintenance reviewed Diet and exercise encouraged  Follow up plan: 3 months    Bari Learn, FNP

## 2024-01-05 ENCOUNTER — Ambulatory Visit (INDEPENDENT_AMBULATORY_CARE_PROVIDER_SITE_OTHER): Admitting: *Deleted

## 2024-01-05 DIAGNOSIS — E538 Deficiency of other specified B group vitamins: Secondary | ICD-10-CM

## 2024-01-05 NOTE — Progress Notes (Signed)
 Patient is in office today for a nurse visit for B12 Injection. Patient Injection was given in the  Left deltoid. Patient tolerated injection well.

## 2024-01-30 ENCOUNTER — Other Ambulatory Visit: Payer: Self-pay | Admitting: Family

## 2024-01-30 DIAGNOSIS — E039 Hypothyroidism, unspecified: Secondary | ICD-10-CM

## 2024-02-06 ENCOUNTER — Ambulatory Visit (INDEPENDENT_AMBULATORY_CARE_PROVIDER_SITE_OTHER): Admitting: *Deleted

## 2024-02-06 ENCOUNTER — Ambulatory Visit: Attending: Physician Assistant | Admitting: Physical Therapy

## 2024-02-06 ENCOUNTER — Encounter: Payer: Self-pay | Admitting: Physical Therapy

## 2024-02-06 DIAGNOSIS — M25611 Stiffness of right shoulder, not elsewhere classified: Secondary | ICD-10-CM | POA: Insufficient documentation

## 2024-02-06 DIAGNOSIS — M25512 Pain in left shoulder: Secondary | ICD-10-CM | POA: Diagnosis present

## 2024-02-06 DIAGNOSIS — E538 Deficiency of other specified B group vitamins: Secondary | ICD-10-CM

## 2024-02-06 NOTE — Progress Notes (Signed)
 Patient is in office today for a nurse visit for B12 Injection. Patient Injection was given in the  Left deltoid. Patient tolerated injection well.

## 2024-02-06 NOTE — Therapy (Signed)
 " OUTPATIENT PHYSICAL THERAPY SHOULDER EVALUATION   Patient Name: Jaclyn Schmidt MRN: [id number] DOB:11-Jul-1950, 74 y.o., female Today's Date: 02/06/2024  END OF SESSION:  PT End of Session - 02/06/24 0950     Visit Number 1    Number of Visits 4    Date for Recertification  02/27/24    PT Start Time 0847    PT Stop Time 0945    PT Time Calculation (min) 58 min    Activity Tolerance Patient tolerated treatment well    Behavior During Therapy [facility] for tasks assessed/performed          Past Medical History:  Diagnosis Date   Anxiety    Breast cancer (HCC) 05/2018   Right Breast Cancer   Cancer (HCC) 04/2018   right breast cancer   Cholelithiasis    Depression    GERD (gastroesophageal reflux disease)    History of kidney stones    Hyperlipidemia    Hypothyroidism    Irritable bowel syndrome    Personal history of radiation therapy 2020   Right Breast Cancer   Pneumonia    Thyroid  disease    Vitamin D  deficiency    Past Surgical History:  Procedure Laterality Date   ABDOMINAL HYSTERECTOMY  [id number]   ANTERIOR CERVICAL DECOMP/DISCECTOMY FUSION N/A 01/21/2017   Procedure: C3-4, C4-5 ANTERIOR CERVICAL DISCECTOMY AND FUSION, ALLOGRAFT, PLATE;  Surgeon: Barbarann Oneil BROCKS, MD;  Location: [facility] OR;  Service: Orthopedics;  Laterality: N/A;   APPENDECTOMY  1996   BREAST LUMPECTOMY Right 05/30/2018   BREAST LUMPECTOMY WITH RADIOACTIVE SEED AND SENTINEL LYMPH NODE BIOPSY Right 05/30/2018   Procedure: RIGHT BREAST LUMPECTOMY WITH RADIOACTIVE SEED AND RIGHT AXILLARY SENTINEL LYMPH NODE BIOPSY;  Surgeon: Ethyl Lenis, MD;  Location: [facility];  Service: General;  Laterality: Right;   BREAST MASS EXCISION  1997   left   CHOLECYSTECTOMY  07/15/2011   Procedure: LAPAROSCOPIC CHOLECYSTECTOMY WITH INTRAOPERATIVE CHOLANGIOGRAM;  Surgeon: Lenis VEAR Ethyl, MD;  Location: [facility] ORS;  Service: General;  Laterality: N/A;  Cholecystectomy with IOC   TOE FUSION     TUBAL LIGATION  1980    Patient Active Problem List   Diagnosis Date Noted   Fracture of sixth thoracic vertebra (HCC) 04/18/2023   History of breast cancer 09/17/2022   Trochanteric bursitis, right hip 02/25/2022   GAD (generalized anxiety disorder) 02/25/2022   Controlled substance agreement signed 02/25/2022   Simple chronic bronchitis (HCC) 08/25/2021   Vitamin B 12 deficiency 03/06/2020   Hyperlipidemia 01/04/2020   Impingement syndrome of right shoulder 08/13/2019   Vertigo 09/05/2017   Hypothyroidism 07/18/2017   Osteopenia 03/09/2017   Anal sphincter incompetence 12/29/2016   Depression, recurrent 12/29/2016   Chronic midline thoracic back pain 09/21/2016   Lumbar pain 09/21/2016   Cholelithiasis 06/03/2011    REFERRING PROVIDER: Dayle Moores PA  REFERRING DIAG: Pain in right shoulder joint.    THERAPY DIAG:  Stiffness of right shoulder, not elsewhere classified  Acute pain of left shoulder  Rationale for Evaluation and Treatment: Rehabilitation  ONSET DATE: 01/07/24.    SUBJECTIVE:  SUBJECTIVE STATEMENT: The patient presents to the clinic with c/o right shoulder pain since 01/07/24 when she tripped and fell.  A recent injection was very helpful.  Her pain at rest today is a l0w 1-2/10.  She plans to limit her visits.  Certain movements can increase her pain and rest decreases her pain.    PERTINENT HISTORY: See above.    PAIN:  Are you having pain? Yes: NPRS scale: 1-2/10.   Pain location: Right shoulder. Pain description: Ache.   Aggravating factors: As above.   Relieving factors: As above.    PRECAUTIONS: None  RED FLAGS: None   WEIGHT BEARING RESTRICTIONS: No  FALLS:  Has patient fallen in last 6 months? Yes. Number of falls 1.    LIVING ENVIRONMENT: Lives in: House/apartment Has  following equipment at home: None.  OCCUPATION: Retired.    PLOF: Independent  PATIENT GOALS:  Use right shoulder without pain.  NEXT MD VISIT:   OBJECTIVE:   PATIENT SURVEYS:  QUICKDASH:  24 points (29.55%).   POSTURE: Forwrad head and rounded shoulders.  UPPER EXTREMITY ROM:   Active antigravity right shoulder flexion is 140 degrees, ER is 80 degrees and excellent behind back motion.    UPPER EXTREMITY MMT:  Right shoulder ER is 4 to 4+/5, middle deltoid is 4 to 4+/5.  IR is 5/5.  SHOULDER SPECIAL TESTS: (-) Right shoulder Impingement testing.  Normal DTR's.  PALPATION:  C/o tingling over right middle deltoid region.  TTP over right posterior cuff musculature.                                                                                                                               TREATMENT DATE: HMP and IFC at 80-150 Hz on 40% scan x 15 minutes to patient's right shoulder f/b pulleys x 5 minutes f/b right shoulder RW4 with yellow theraband.     PATIENT EDUCATION: Education details: See below.   Person educated: Patient Education method: Explanation, Demonstration, Tactile cues, Verbal cues, and Handouts Education comprehension: verbalized understanding, returned demonstration, verbal cues required, and tactile cues required  HOME EXERCISE PROGRAM: RW4 with yellow theraband.  Handout to obtain an over the door pulley system and TENS unit.    ASSESSMENT:  CLINICAL IMPRESSION: The patient presents to the clinic with c/o right shoulder pain since last month.  A recent injection was very helpful and her pain is low today.  She has some loss of motion but states this is much improved since her injection.  She has some minimal weakness.  She demonstrated negative Impingement testing today.  A HEP was established today with pictures provided and handout to obtain an over the door pulley system and TENS unit.  She would like to limit her visits.  Patient will benefit  from skilled physical therapy intervention to address pain and deficits.    OBJECTIVE IMPAIRMENTS: decreased activity tolerance, decreased ROM, decreased strength, increased muscle spasms, and pain.  ACTIVITY LIMITATIONS: carrying, lifting, and reach over head  PARTICIPATION LIMITATIONS: meal prep, cleaning, laundry, and yard work  [facility] POTENTIAL: Excellent  CLINICAL DECISION MAKING: Stable/uncomplicated  EVALUATION COMPLEXITY: Low   GOALS:  SHORT TERM GOALS: Target date: 03/05/24.  Ind with a HEP. Goal status: INITIAL  2.  Perform ADL's with pain not > 2/10.  Goal status: INITIAL  3.  Improve QUICKDASH by 2-3 points.  Goal status: INITIAL   PLAN:  PT FREQUENCY/DURATION:  4 visits.    PLANNED INTERVENTIONS: 97110-Therapeutic exercises, 97530- Therapeutic activity, [id number]- Neuromuscular re-education, 97535- Self Care, 02859- Manual therapy, [id number]- Electrical stimulation (unattended), 97016- Vasopneumatic device, 97035- Ultrasound, Patient/Family education, Cryotherapy, and Moist heat  PLAN FOR NEXT SESSION: Review HEP, add full can and SDLY ER, UBE.  PRE's.  Red theraband for progression.   STW/M and modaliites as needed.       Deantre Bourdon, PT 02/06/2024, 10:11 AM  "

## 2024-02-15 ENCOUNTER — Ambulatory Visit: Admitting: Physical Therapy

## 2024-02-19 ENCOUNTER — Other Ambulatory Visit: Payer: Self-pay | Admitting: Family

## 2024-02-19 DIAGNOSIS — F411 Generalized anxiety disorder: Secondary | ICD-10-CM

## 2024-02-19 DIAGNOSIS — F339 Major depressive disorder, recurrent, unspecified: Secondary | ICD-10-CM

## 2024-03-09 ENCOUNTER — Ambulatory Visit

## 2024-03-30 ENCOUNTER — Ambulatory Visit: Admitting: Family
